# Patient Record
Sex: Female | Born: 1935 | Race: White | Hispanic: No | Smoking: Former smoker
Health system: Southern US, Community
[De-identification: ages and names within clinical notes are randomized; demographics above are authoritative.]

## PROBLEM LIST (undated history)

## (undated) DIAGNOSIS — Z87891 Personal history of nicotine dependence: Secondary | ICD-10-CM

## (undated) DIAGNOSIS — D490 Neoplasm of unspecified behavior of digestive system: Secondary | ICD-10-CM

## (undated) DIAGNOSIS — K559 Vascular disorder of intestine, unspecified: Secondary | ICD-10-CM

## (undated) DIAGNOSIS — J189 Pneumonia, unspecified organism: Secondary | ICD-10-CM

## (undated) DIAGNOSIS — I2510 Atherosclerotic heart disease of native coronary artery without angina pectoris: Secondary | ICD-10-CM

## (undated) DIAGNOSIS — F419 Anxiety disorder, unspecified: Secondary | ICD-10-CM

## (undated) DIAGNOSIS — I7300 Raynaud's syndrome without gangrene: Secondary | ICD-10-CM

## (undated) DIAGNOSIS — R0602 Shortness of breath: Secondary | ICD-10-CM

## (undated) DIAGNOSIS — I10 Essential (primary) hypertension: Secondary | ICD-10-CM

## (undated) DIAGNOSIS — J449 Chronic obstructive pulmonary disease, unspecified: Secondary | ICD-10-CM

## (undated) DIAGNOSIS — E785 Hyperlipidemia, unspecified: Secondary | ICD-10-CM

## (undated) DIAGNOSIS — I5020 Unspecified systolic (congestive) heart failure: Secondary | ICD-10-CM

## (undated) DIAGNOSIS — B0230 Zoster ocular disease, unspecified: Secondary | ICD-10-CM

## (undated) DIAGNOSIS — B0229 Other postherpetic nervous system involvement: Secondary | ICD-10-CM

## (undated) DIAGNOSIS — I213 ST elevation (STEMI) myocardial infarction of unspecified site: Secondary | ICD-10-CM

## (undated) DIAGNOSIS — I499 Cardiac arrhythmia, unspecified: Secondary | ICD-10-CM

## (undated) DIAGNOSIS — I209 Angina pectoris, unspecified: Secondary | ICD-10-CM

## (undated) DIAGNOSIS — I5033 Acute on chronic diastolic (congestive) heart failure: Secondary | ICD-10-CM

## (undated) DIAGNOSIS — I739 Peripheral vascular disease, unspecified: Secondary | ICD-10-CM

## (undated) HISTORY — PX: DILATION AND CURETTAGE OF UTERUS: SHX78

## (undated) HISTORY — DX: Neoplasm of unspecified behavior of digestive system: D49.0

## (undated) HISTORY — DX: Other postherpetic nervous system involvement: B02.29

## (undated) HISTORY — DX: Vascular disorder of intestine, unspecified: K55.9

## (undated) HISTORY — DX: Zoster ocular disease, unspecified: B02.30

## (undated) HISTORY — DX: Cardiac arrhythmia, unspecified: I49.9

## (undated) HISTORY — DX: Peripheral vascular disease, unspecified: I73.9

## (undated) HISTORY — DX: Atherosclerotic heart disease of native coronary artery without angina pectoris: I25.10

## (undated) HISTORY — DX: Shortness of breath: R06.02

## (undated) HISTORY — DX: Raynaud's syndrome without gangrene: I73.00

## (undated) HISTORY — DX: Hyperlipidemia, unspecified: E78.5

## (undated) HISTORY — DX: Chronic obstructive pulmonary disease, unspecified: J44.9

## (undated) HISTORY — DX: Personal history of nicotine dependence: Z87.891

## (undated) HISTORY — PX: CORONARY ANGIOPLASTY WITH STENT PLACEMENT: SHX49

## (undated) HISTORY — DX: Essential (primary) hypertension: I10

---
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
## 2007-05-03 ENCOUNTER — Inpatient Hospital Stay (HOSPITAL_COMMUNITY): Admission: EM | Admit: 2007-05-03 | Discharge: 2007-05-06 | Payer: Self-pay | Admitting: Emergency Medicine

## 2007-05-08 ENCOUNTER — Ambulatory Visit: Payer: Self-pay | Admitting: Internal Medicine

## 2007-06-24 ENCOUNTER — Ambulatory Visit: Payer: Self-pay | Admitting: Internal Medicine

## 2008-02-14 DIAGNOSIS — D509 Iron deficiency anemia, unspecified: Secondary | ICD-10-CM | POA: Insufficient documentation

## 2008-02-14 DIAGNOSIS — J4489 Other specified chronic obstructive pulmonary disease: Secondary | ICD-10-CM | POA: Insufficient documentation

## 2008-02-14 DIAGNOSIS — K559 Vascular disorder of intestine, unspecified: Secondary | ICD-10-CM | POA: Insufficient documentation

## 2008-02-14 DIAGNOSIS — B0239 Other herpes zoster eye disease: Secondary | ICD-10-CM | POA: Insufficient documentation

## 2008-02-14 DIAGNOSIS — J449 Chronic obstructive pulmonary disease, unspecified: Secondary | ICD-10-CM

## 2008-02-14 DIAGNOSIS — B0229 Other postherpetic nervous system involvement: Secondary | ICD-10-CM | POA: Insufficient documentation

## 2008-09-27 ENCOUNTER — Ambulatory Visit: Payer: Self-pay | Admitting: Internal Medicine

## 2008-09-27 ENCOUNTER — Inpatient Hospital Stay (HOSPITAL_COMMUNITY): Admission: EM | Admit: 2008-09-27 | Discharge: 2008-09-30 | Payer: Self-pay | Admitting: Emergency Medicine

## 2008-09-28 ENCOUNTER — Ambulatory Visit: Payer: Self-pay | Admitting: Surgery

## 2008-10-22 ENCOUNTER — Ambulatory Visit: Payer: Self-pay | Admitting: Cardiology

## 2008-11-23 ENCOUNTER — Ambulatory Visit: Payer: Self-pay | Admitting: Cardiology

## 2008-11-23 LAB — CONVERTED CEMR LAB
ALT: 22 units/L (ref 0–35)
AST: 20 units/L (ref 0–37)
Albumin: 3.8 g/dL (ref 3.5–5.2)
Cholesterol: 161 mg/dL (ref 0–200)
HDL: 82.3 mg/dL (ref >39.0)
Total Protein: 6.5 g/dL (ref 6.0–8.3)
Triglycerides: 57 mg/dL (ref 0–149)
VLDL: 11 mg/dL (ref 0–40)

## 2008-11-25 ENCOUNTER — Ambulatory Visit: Payer: Self-pay | Admitting: Internal Medicine

## 2008-11-25 ENCOUNTER — Inpatient Hospital Stay (HOSPITAL_COMMUNITY): Admission: EM | Admit: 2008-11-25 | Discharge: 2008-12-02 | Payer: Self-pay | Admitting: Emergency Medicine

## 2008-11-26 ENCOUNTER — Ambulatory Visit: Payer: Self-pay | Admitting: Surgery

## 2008-11-26 ENCOUNTER — Encounter: Payer: Self-pay | Admitting: Surgery

## 2008-11-27 HISTORY — PX: CORONARY ARTERY BYPASS GRAFT: SHX141

## 2008-11-30 ENCOUNTER — Encounter: Payer: Self-pay | Admitting: Surgery

## 2008-11-30 ENCOUNTER — Ambulatory Visit: Payer: Self-pay | Admitting: Surgery

## 2008-12-15 ENCOUNTER — Ambulatory Visit: Payer: Self-pay | Admitting: Cardiology

## 2008-12-15 LAB — CONVERTED CEMR LAB
BUN: 8 mg/dL (ref 6–23)
Basophils Absolute: 0 10*3/uL (ref 0.0–0.1)
Basophils Relative: 0.3 % (ref 0.0–3.0)
CO2: 28 meq/L (ref 19–32)
Chloride: 103 meq/L (ref 96–112)
Creatinine, Ser: 0.7 mg/dL (ref 0.4–1.2)
Eosinophils Relative: 0.4 % (ref 0.0–5.0)
GFR calc non Af Amer: 86 mL/min
Lymphocytes Relative: 8 % — ABNORMAL LOW (ref 12.0–46.0)
Neutrophils Relative %: 87 % — ABNORMAL HIGH (ref 43.0–77.0)
Platelets: 531 10*3/uL — ABNORMAL HIGH (ref 150–400)
Potassium: 3.6 meq/L (ref 3.5–5.1)
RBC: 4.38 M/uL (ref 3.87–5.11)
WBC: 8.3 10*3/uL (ref 4.5–10.5)

## 2008-12-17 ENCOUNTER — Ambulatory Visit: Payer: Self-pay | Admitting: Cardiology

## 2008-12-21 ENCOUNTER — Ambulatory Visit: Payer: Self-pay | Admitting: Surgery

## 2008-12-21 ENCOUNTER — Encounter: Admission: RE | Admit: 2008-12-21 | Discharge: 2008-12-21 | Payer: Self-pay | Admitting: Surgery

## 2009-01-05 ENCOUNTER — Ambulatory Visit: Payer: Self-pay | Admitting: Surgery

## 2009-01-05 ENCOUNTER — Encounter: Admission: RE | Admit: 2009-01-05 | Discharge: 2009-01-05 | Payer: Self-pay | Admitting: Surgery

## 2009-01-06 ENCOUNTER — Ambulatory Visit: Payer: Self-pay | Admitting: Cardiology

## 2009-01-06 LAB — CONVERTED CEMR LAB
ALT: 15 units/L (ref 0–35)
AST: 15 units/L (ref 0–37)
Alkaline Phosphatase: 79 units/L (ref 39–117)
Basophils Absolute: 0 10*3/uL (ref 0.0–0.1)
Basophils Relative: 0 % (ref 0.0–3.0)
Bilirubin, Direct: 0.1 mg/dL (ref 0.0–0.3)
CO2: 29 meq/L (ref 19–32)
Chloride: 106 meq/L (ref 96–112)
Cholesterol: 178 mg/dL (ref 0–200)
Eosinophils Absolute: 0.2 10*3/uL (ref 0.0–0.7)
GFR calc non Af Amer: 74 mL/min
HDL: 70.7 mg/dL (ref >39.0)
LDL Cholesterol: 93 mg/dL (ref 0–99)
Lymphocytes Relative: 21.1 % (ref 12.0–46.0)
MCHC: 34.6 g/dL (ref 30.0–36.0)
MCV: 89.6 fL (ref 78.0–100.0)
Neutrophils Relative %: 65.9 % (ref 43.0–77.0)
Platelets: 303 10*3/uL (ref 150–400)
Potassium: 5.2 meq/L — ABNORMAL HIGH (ref 3.5–5.1)
RBC: 4.55 M/uL (ref 3.87–5.11)
Sodium: 142 meq/L (ref 135–145)
Total Bilirubin: 0.7 mg/dL (ref 0.3–1.2)
VLDL: 14 mg/dL (ref 0–40)

## 2009-01-17 DIAGNOSIS — T887XXA Unspecified adverse effect of drug or medicament, initial encounter: Secondary | ICD-10-CM | POA: Insufficient documentation

## 2009-01-17 DIAGNOSIS — I10 Essential (primary) hypertension: Secondary | ICD-10-CM

## 2009-01-17 DIAGNOSIS — E785 Hyperlipidemia, unspecified: Secondary | ICD-10-CM

## 2009-01-17 DIAGNOSIS — I25810 Atherosclerosis of coronary artery bypass graft(s) without angina pectoris: Secondary | ICD-10-CM

## 2009-01-18 ENCOUNTER — Ambulatory Visit: Payer: Self-pay | Admitting: Cardiology

## 2009-01-18 ENCOUNTER — Encounter: Payer: Self-pay | Admitting: Cardiology

## 2009-01-18 DIAGNOSIS — E875 Hyperkalemia: Secondary | ICD-10-CM | POA: Insufficient documentation

## 2009-01-28 ENCOUNTER — Encounter (HOSPITAL_COMMUNITY): Admission: RE | Admit: 2009-01-28 | Discharge: 2009-04-28 | Payer: Self-pay | Admitting: Cardiology

## 2009-02-08 ENCOUNTER — Ambulatory Visit: Payer: Self-pay | Admitting: Cardiology

## 2009-02-08 ENCOUNTER — Inpatient Hospital Stay (HOSPITAL_COMMUNITY): Admission: EM | Admit: 2009-02-08 | Discharge: 2009-02-11 | Payer: Self-pay | Admitting: Emergency Medicine

## 2009-02-11 DIAGNOSIS — I499 Cardiac arrhythmia, unspecified: Secondary | ICD-10-CM

## 2009-02-11 HISTORY — DX: Cardiac arrhythmia, unspecified: I49.9

## 2009-02-16 ENCOUNTER — Ambulatory Visit: Payer: Self-pay | Admitting: Cardiology

## 2009-02-16 ENCOUNTER — Ambulatory Visit: Payer: Self-pay | Admitting: Cardiovascular Disease

## 2009-02-16 LAB — CONVERTED CEMR LAB
BUN: 22 mg/dL (ref 6–23)
CO2: 29 meq/L (ref 19–32)
Calcium: 10.4 mg/dL (ref 8.4–10.5)
Creatinine, Ser: 1 mg/dL (ref 0.4–1.2)
GFR calc non Af Amer: 56.85 mL/min (ref >60)
Glucose, Bld: 103 mg/dL — ABNORMAL HIGH (ref 70–99)
Sodium: 145 meq/L (ref 135–145)

## 2009-02-23 ENCOUNTER — Ambulatory Visit: Payer: Self-pay | Admitting: Cardiology

## 2009-02-23 DIAGNOSIS — I4891 Unspecified atrial fibrillation: Secondary | ICD-10-CM

## 2009-02-24 ENCOUNTER — Ambulatory Visit: Payer: Self-pay | Admitting: Cardiology

## 2009-02-24 ENCOUNTER — Encounter: Payer: Self-pay | Admitting: Cardiology

## 2009-02-24 DIAGNOSIS — I7389 Other specified peripheral vascular diseases: Secondary | ICD-10-CM | POA: Insufficient documentation

## 2009-03-02 ENCOUNTER — Ambulatory Visit: Payer: Self-pay | Admitting: Cardiology

## 2009-03-10 ENCOUNTER — Encounter: Payer: Self-pay | Admitting: Cardiology

## 2009-03-10 ENCOUNTER — Ambulatory Visit: Payer: Self-pay

## 2009-04-07 ENCOUNTER — Telehealth: Payer: Self-pay | Admitting: Cardiology

## 2009-04-08 ENCOUNTER — Encounter: Payer: Self-pay | Admitting: Cardiology

## 2009-04-13 ENCOUNTER — Encounter: Payer: Self-pay | Admitting: *Deleted

## 2009-04-20 ENCOUNTER — Ambulatory Visit: Payer: Self-pay | Admitting: Cardiovascular Disease

## 2009-04-20 DIAGNOSIS — I739 Peripheral vascular disease, unspecified: Secondary | ICD-10-CM | POA: Insufficient documentation

## 2009-04-22 ENCOUNTER — Ambulatory Visit: Payer: Self-pay | Admitting: Cardiology

## 2009-05-19 ENCOUNTER — Encounter: Payer: Self-pay | Admitting: *Deleted

## 2009-06-26 ENCOUNTER — Ambulatory Visit: Payer: Self-pay | Admitting: Internal Medicine

## 2009-06-27 ENCOUNTER — Inpatient Hospital Stay (HOSPITAL_COMMUNITY): Admission: EM | Admit: 2009-06-27 | Discharge: 2009-06-28 | Payer: Self-pay | Admitting: Emergency Medicine

## 2009-06-30 ENCOUNTER — Telehealth (INDEPENDENT_AMBULATORY_CARE_PROVIDER_SITE_OTHER): Payer: Self-pay | Admitting: Radiology

## 2009-07-28 ENCOUNTER — Encounter: Payer: Self-pay | Admitting: Cardiology

## 2009-07-30 ENCOUNTER — Ambulatory Visit: Payer: Self-pay | Admitting: Cardiology

## 2009-08-24 ENCOUNTER — Ambulatory Visit: Payer: Self-pay | Admitting: Cardiovascular Disease

## 2009-12-15 ENCOUNTER — Encounter (INDEPENDENT_AMBULATORY_CARE_PROVIDER_SITE_OTHER): Payer: Self-pay | Admitting: Emergency Medicine

## 2009-12-15 ENCOUNTER — Ambulatory Visit: Payer: Self-pay | Admitting: Vascular Surgery

## 2009-12-15 ENCOUNTER — Emergency Department (HOSPITAL_COMMUNITY): Admission: EM | Admit: 2009-12-15 | Discharge: 2009-12-15 | Payer: Self-pay | Admitting: Pediatrics

## 2009-12-20 ENCOUNTER — Encounter: Payer: Self-pay | Admitting: Cardiovascular Disease

## 2009-12-20 ENCOUNTER — Ambulatory Visit: Payer: Self-pay

## 2009-12-20 ENCOUNTER — Ambulatory Visit: Payer: Self-pay | Admitting: Cardiology

## 2009-12-20 DIAGNOSIS — I6529 Occlusion and stenosis of unspecified carotid artery: Secondary | ICD-10-CM

## 2010-02-15 ENCOUNTER — Encounter: Payer: Self-pay | Admitting: Cardiovascular Disease

## 2010-02-24 ENCOUNTER — Ambulatory Visit: Payer: Self-pay | Admitting: Cardiovascular Disease

## 2010-04-15 ENCOUNTER — Telehealth: Payer: Self-pay | Admitting: Cardiology

## 2010-06-14 ENCOUNTER — Ambulatory Visit: Payer: Self-pay | Admitting: Cardiology

## 2010-08-22 ENCOUNTER — Observation Stay (HOSPITAL_COMMUNITY): Admission: EM | Admit: 2010-08-22 | Discharge: 2010-08-23 | Payer: Self-pay | Admitting: Emergency Medicine

## 2010-08-22 ENCOUNTER — Ambulatory Visit: Payer: Self-pay | Admitting: Cardiology

## 2010-09-09 ENCOUNTER — Encounter: Payer: Self-pay | Admitting: Cardiovascular Disease

## 2010-09-12 ENCOUNTER — Ambulatory Visit: Payer: Self-pay | Admitting: Cardiovascular Disease

## 2010-09-12 ENCOUNTER — Ambulatory Visit: Payer: Self-pay

## 2010-12-14 NOTE — Assessment & Plan Note (Signed)
Summary: 6 month follow up/443.9/pla   Visit Type:  6 mo f/u Referring Provider:  Charlies Constable Primary Provider:  Eric Form  CC:  pt states she feels very good today....Marland Kitchenno complaints today.  History of Present Illness: 77 yo WF with history of PAD,  CAD s/p CABG 1/10, HTN, hyperlipidemia, former tobacco abuse and COPD who is here today for PV follow up. I initially saw her in June of 2010 for PV workup. she is followed by Dr. Juanda Chance for her cardiac issues. At the first visit, she was complaining of right leg cramping with long periods of walking but had no claudication with walking short distances. We elected to manage her conservatively with a walking program. She is here today for follow up. She has been doing well. She has been walking with very little pain in her right leg. She did have an episode in January of severe left leg pain that started at night. She was seen in the ED and had negative venous dopplers. Dr. Clelia Croft evaluated her and per her report, felt that the pain was neuromuscular. The pain resolved that night. No recurrent left leg pain.  She has no rest pain, numbness or ulcerations in the right leg or foot. Her bilateral lower ext varicosities are unchanged. She has no other complaints today. She once again tells me that she prefers to manage her PV disease conservatively at this time.    She was admitted to [facility] overnight on 08/23/10 for chest pain and ruled out for an MI with cardiac enzymes. There were discussions about a stress test but she refused.   ABI today 0.59 on right and 0.77 on the left.   8 Current Medications (verified): 1)  Amlodipine Besylate 5 Mg Tabs (Amlodipine Besylate) .Marland Kitchen.. 1 1/2 Tab Once Daily 2)  Bayer Low Strength 81 Mg Tbec (Aspirin) .... Take One Tab Once Daily 3)  Colace 100 Mg Caps (Docusate Sodium) .... Prn 4)  Nitrostat 0.4 Mg Subl (Nitroglycerin) .... Prn 5)  Tramadol Hcl 50 Mg Tabs (Tramadol Hcl) .... Prn 6)  Furosemide 20 Mg Tabs  (Furosemide) .... As Needed 7)  Warfarin Sodium 2.5 Mg Tabs (Warfarin Sodium) .... Use As Directed By Anticoagualtion Clinic 8)  Fish Oil 1200 Mg Caps (Omega-3 Fatty Acids) .... Take 1 Capsule By Mouth Once A Day 9)  Coq10 100 Mg Caps (Coenzyme Q10) .... Once A Day 10)  Vitamin D 1000 Unit Tabs (Cholecalciferol) .... Take 1 Tablet By Mouth Once A Day  Allergies: 1)  ! * Codiene 2)  ! Acetamin (Acetaminophen) 3)  ! Plavix (Clopidogrel Bisulfate) 4)  ! * Statins  Past History:  Past Medical History: Reviewed history from 07/29/2009 and no changes required. 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 Coronary artery disease status post previous anterior wall     myocardial infarction, treated with PTCA dx and subsequent non-ST-     elevation myocardial infarction with recent bypass surgery, November 29, 2008. 2. Hypertension. 3. Hyperlipidemia 4. Shortness of breath, uncertain etiology. 5. Previous cigarette smoking. 6. Chronic obstructive pulmonary disease. 7. Raynaud phenomenon. 8. History of ischemic colitis.  9. Drug allergy Plavix Nausea 10. Paroxysmal Fibrillation 02/2009 11. Intolerance to multiple medications including Cardizem, beta blockers, Lipitor, Plavix 12. PVD with claudication  Social History: Reviewed history from 04/20/2009 and no changes required. She lives alone. Homemaker She moved  here from [place] to live near her daughter and son-in-law. She is a former smoker. Tobacco abuse for 65 years, one pack per day, stopped in January 2010. 2 alcoholic beverages per day No ilicit drugs  Review of Systems  The patient denies fatigue, malaise, fever, weight gain/loss, vision loss, decreased hearing, hoarseness, chest pain, palpitations, shortness of breath, prolonged cough, wheezing,  sleep apnea, coughing up blood, abdominal pain, blood in stool, nausea, vomiting, diarrhea, heartburn, incontinence, blood in urine, muscle weakness, joint pain, leg swelling, rash, skin lesions, headache, fainting, dizziness, depression, anxiety, enlarged lymph nodes, easy bruising or bleeding, and environmental allergies.    Vital Signs:  Patient profile:   77 year old female Height:      63 inches Weight:      124.8 pounds Pulse rate:   78 / minute Pulse rhythm:   irregular BP sitting:   166 / 80  (left arm) Cuff size:   regular  Vitals Entered By: Danielle Rankin, CMA (September 12, 2010 11:50 AM)  Physical Exam  General:  General: Well developed, well nourished, NAD HEENT: OP clear, mucus membranes moist Musculoskeletal: Muscle strength 5/5 all ext Psychiatric: Mood and affect normal Neck: No JVD, no carotid bruits, no thyromegaly, no lymphadenopathy. Lungs:Clear bilaterally, no wheezes, rhonci, crackles CV: RRR no murmurs, gallops rubs Abdomen: soft, NT, ND, BS present Extremities: No edema, pulses 1+ left DP/PT. trace right DP/PT. Varicosities over both feet.     ABI's  Procedure date:  09/12/2010  Findings:      Right ABI 0.59 (previously 0.50) Left ABI 0.77 (previously 0.94)  EKG  Procedure date:  09/12/2010  Findings:      NSR, rate 78 bpm. Non-specific T wave abnormalities.   Impression & Recommendations:  Problem # 1:  PVD WITH CLAUDICATION (ICD-443.89) Stable ABI. No leg pain with walking or at rest. Continue conservative therapy. Repeat ABI in 6 months. She will call us if there is any change in her clinical status.   Problem # 2:  CAD, AUTOLOGOUS BYPASS GRAFT (ICD-414.02) Stable. Recent episode of atypical pain. She does not wish to have a cardiac stress test. Her BP is slightly up today but she does not wish to change her meds. Her Norvasc was recently started.   Her updated medication list for this problem includes:    Amlodipine Besylate 5 Mg Tabs  (Amlodipine besylate) .Marland Kitchen... 1 1/2 tab once daily    Bayer Low Strength 81 Mg Tbec (Aspirin) .Marland Kitchen... Take one tab once daily    Nitrostat 0.4 Mg Subl (Nitroglycerin) .Marland Kitchen... Prn    Warfarin Sodium 2.5 Mg Tabs (Warfarin sodium) ..... Use as directed by anticoagualtion clinic  Other Orders: EKG w/ Interpretation (93000)  Patient Instructions: 1)  Your physician recommends that you schedule a follow-up appointment in: 6 months 2)  Your physician recommends that you continue on your current medications as directed. Please refer to the Current Medication list given to you today. 3)  Your physician has requested that you have an ankle brachial index (ABI) in 6 months. During this test an ultrasound and blood pressure cuff are used to evaluate the arteries that supply the arms and legs with blood. Allow thirty minutes for this exam. There are no restrictions or special instructions.

## 2010-12-14 NOTE — Progress Notes (Signed)
Summary: personal call, no info was given  Phone Note Call from Patient Call back at Home Phone [phone]   Caller: Patient Reason for Call: Talk to Nurse Summary of Call: personal call, no info was given.  Initial call taken by: Lorne Skeens,  April 15, 2010 9:47 AM  Follow-up for Phone Call        talked with pt by telephone--she is requesting an appt with Dr Warnell Bureau made with Dr Juanda Chance 06-14-10

## 2010-12-14 NOTE — Assessment & Plan Note (Signed)
Summary: per check out/sf   Visit Type:  Follow-up Referring Provider:  Charlies Constable Primary Provider:  Eric Form  CC:  Right leg pain when walking long distances.  History of Present Illness: 77 yo WF with history of PAD,  CAD s/p CABG 1/10, HTN, hyperlipidemia, former tobacco abuse and COPD who is here today for PV follow up. I initially saw her in June of 2010 for PV workup. she is followed by Dr. Juanda Chance for her cardiac issues. At the first visit, she was complaining of right leg cramping with long periods of walking but had no claudication with walking short distances. We elected to manage her conservatively with a walking program. She is here today for follow up. She has been doing well. She has been walking with very little pain in her right leg. She did have an episode in January of severe left leg pain that started at night. She was seen in the ED and had negative venous dopplers. Dr. Clelia Croft evaluated her and per her report, felt that the pain was neuromuscular. The pain resolved that night. No recurrent left leg pain.  She has no rest pain, numbness or ulcerations in the right leg or foot. Her bilateral lower ext varicosities are unchanged. She has no other complaints today. She once again tells me that she prefers to manage her PV disease conservatively at this time.  Her BP is elevated today but her best friend died yesterday and she has had her grandkids. She does not wish to change her medications. She was seen by Dr. Clelia Croft last week and her BP was ok per her report.    Current Medications (verified): 1)  Amlodipine Besylate 5 Mg Tabs (Amlodipine Besylate) .... Take One Tab Once Daily 2)  Bayer Low Strength 81 Mg Tbec (Aspirin) .... Take One Tab Once Daily 3)  Colace 100 Mg Caps (Docusate Sodium) .... Prn 4)  Nitrostat 0.4 Mg Subl (Nitroglycerin) .... Prn 5)  Tramadol Hcl 50 Mg Tabs (Tramadol Hcl) .... Prn 6)  Furosemide 20 Mg Tabs (Furosemide) .... As Needed 7)  Warfarin Sodium 2.5  Mg Tabs (Warfarin Sodium) .... Use As Directed By Anticoagualtion Clinic 8)  Fish Oil 1200 Mg Caps (Omega-3 Fatty Acids) .... Take 1 Capsule By Mouth Once A Day 9)  Coq10 100 Mg Caps (Coenzyme Q10) .... Once A Day 10)  Vitamin D 1000 Unit Tabs (Cholecalciferol) .... Take 1 Tablet By Mouth Once A Day  Allergies: 1)  ! * Codiene 2)  ! Acetamin (Acetaminophen) 3)  ! Plavix (Clopidogrel Bisulfate) 4)  ! * Statins  Past History:  Past Medical History: Reviewed history from 07/29/2009 and no changes required. 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 Coronary artery disease status post previous anterior wall     myocardial infarction, treated with PTCA dx and subsequent non-ST-     elevation myocardial infarction with recent bypass surgery, November 29, 2008. 2. Hypertension. 3. Hyperlipidemia 4. Shortness of breath, uncertain etiology. 5. Previous cigarette smoking. 6. Chronic obstructive pulmonary disease. 7. Raynaud phenomenon. 8. History of ischemic colitis.  9. Drug allergy Plavix Nausea 10. Paroxysmal Fibrillation 02/2009 11. Intolerance to multiple medications including Cardizem, beta blockers, Lipitor, Plavix 12. PVD with claudication  Social History: Reviewed history from 04/20/2009 and no changes required. She lives alone. Homemaker She moved here from [place] to live near her daughter  and son-in-law. She is a former smoker. Tobacco abuse for 65 years, one pack per day, stopped in January 2010. 2 alcoholic beverages per day No ilicit drugs  Review of Systems  The patient denies fatigue, malaise, fever, weight gain/loss, vision loss, decreased hearing, hoarseness, chest pain, palpitations, shortness of breath, prolonged cough, wheezing, sleep apnea, coughing up blood, abdominal pain, blood in  stool, nausea, vomiting, diarrhea, heartburn, incontinence, blood in urine, muscle weakness, joint pain, leg swelling, rash, skin lesions, headache, fainting, dizziness, depression, anxiety, enlarged lymph nodes, easy bruising or bleeding, and environmental allergies.         See HPI  Vital Signs:  Patient profile:   77 year old female Height:      63 inches Weight:      127.75 pounds BMI:     22.71 Pulse rate:   76 / minute Pulse rhythm:   irregular Resp:     18 per minute BP sitting:   180 / 100  (left arm) Cuff size:   regular  Vitals Entered By: Vikki Ports (February 24, 2010 9:00 AM)  Serial Vital Signs/Assessments:  Time      Position  BP       Pulse  Resp  Temp     By           R Arm     180/100                        Vikki Ports   Physical Exam  General:  General: Well developed, well nourished, NAD HEENT: OP clear, mucus membranes moist Musculoskeletal: Muscle strength 5/5 all ext Psychiatric: Mood and affect normal Neck: No JVD, no carotid bruits, no thyromegaly, no lymphadenopathy. Lungs:Clear bilaterally, no wheezes, rhonci, crackles CV: RRR no murmurs, gallops rubs Abdomen: soft, NT, ND, BS present Extremities: No edema, pulses 1+ left DP/PT. trace right DP/PT. Varicosities over both feet.      Carotid Doppler  Procedure date:  12/20/2009  Findings:      RICA 0-39% LICA 40-59%  Arterial Doppler  Procedure date:  12/20/2009  Findings:      Right ABI 0.50 Left ABI 0.94. Stable  Impression & Recommendations:  Problem # 1:  PVD WITH CLAUDICATION (ICD-443.89) Her symptoms are unchanged. She continues to have mild pain in the right leg when walking long distances but this has not been bothersome. She is known to have an occlusion of the right SFA with reconstitution by collaterals in the distal SFA. She wishes to continue conservative therapy and monitoring. Continue ASA and walking program for now. Repeat ABI 6 months. I will not start Pletal or  Plavix since she is on coumadin.   Problem # 2:  CAROTID ARTERY DISEASE (ICD-433.10) Repeat dopplers in February 2012. Stable disease as above.  Her updated medication list for this problem includes:    Bayer Low Strength 81 Mg Tbec (Aspirin) .Marland Kitchen... Take one tab once daily    Warfarin Sodium 2.5 Mg Tabs (Warfarin sodium) ..... Use as directed by anticoagualtion clinic  Problem # 3:  HYPERTENSION, BENIGN (ICD-401.1) Elevated today. She does not wish to change her medications. Her BP has been controlled on all other recent checks.   Her updated medication list for this problem includes:    Amlodipine Besylate 5 Mg Tabs (Amlodipine besylate) .Marland Kitchen... Take one tab once daily    Bayer Low Strength 81 Mg Tbec (Aspirin) .Marland Kitchen... Take one tab once daily    Furosemide  20 Mg Tabs (Furosemide) .Marland Kitchen... As needed  Patient Instructions: 1)  Your physician recommends that you schedule a follow-up appointment in: 6 months 2)  Your physician recommends that you continue on your current medications as directed. Please refer to the Current Medication list given to you today. 3)  Your physician has requested that you have an ankle brachial index (ABI). During this test an ultrasound and blood pressure cuff are used to evaluate the arteries that supply the arms and legs with blood. Allow thirty minutes for this exam. There are no restrictions or special instructions.  To be done in 6 months

## 2010-12-14 NOTE — Assessment & Plan Note (Signed)
Summary: f66m   Visit Type:  Follow-up Referring Provider:  Charlies Constable Primary Provider:  Eric Form  CC:  pt concerned about her legs and her veins..  History of Present Illness: The patient is 77 years old and return for followup management of CAD and paroxysmal atrial fibrillation. She previously had a standing to do a diagnostic with an in and had recurrence and then underwent bypass surgery for multivessel disease in January 2010. She has done well from the standpoint of her heart since that time with no recent chest pain.  She also has had paroxysmal atrial fibrillation which initially was managed with rate control and Coumadin. She was intolerant to rate control medications and is just being treated with Coumadin but has had no recurrences. She is at high risk for stroke and has a [place] Vasc score of 5.  Her other problem is peripheral vascular disease. She has an index of 0.5 on the right and 0.9 in the left and has symptoms of right calf claudication. These are not very limiting. She has seen Dr. Clifton James and consider therapy has been planned.  She also has carotid disease was 0-39% on the right and 40-59% on the left.    Current Medications (verified): 1)  Amlodipine Besylate 5 Mg Tabs (Amlodipine Besylate) .... Take One Tab Once Daily 2)  Bayer Low Strength 81 Mg Tbec (Aspirin) .... Take One Tab Once Daily 3)  Colace 100 Mg Caps (Docusate Sodium) .... Prn 4)  Nitrostat 0.4 Mg Subl (Nitroglycerin) .... Prn 5)  Tramadol Hcl 50 Mg Tabs (Tramadol Hcl) .... Prn 6)  Furosemide 20 Mg Tabs (Furosemide) .... As Needed 7)  Warfarin Sodium 2.5 Mg Tabs (Warfarin Sodium) .... Use As Directed By Anticoagualtion Clinic 8)  Fish Oil 1200 Mg Caps (Omega-3 Fatty Acids) .... Take 1 Capsule By Mouth Once A Day 9)  Coq10 100 Mg Caps (Coenzyme Q10) .... Once A Day 10)  Vitamin D 1000 Unit Tabs (Cholecalciferol) .... Take 1 Tablet By Mouth Once A Day  Allergies (verified): 1)  ! * Codiene 2)  !  Acetamin (Acetaminophen) 3)  ! Plavix (Clopidogrel Bisulfate) 4)  ! * Statins  Past History:  Past Medical History: Reviewed history from 07/29/2009 and no changes required. 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 Coronary artery disease status post previous anterior wall     myocardial infarction, treated with PTCA dx and subsequent non-ST-     elevation myocardial infarction with recent bypass surgery, November 29, 2008. 2. Hypertension. 3. Hyperlipidemia 4. Shortness of breath, uncertain etiology. 5. Previous cigarette smoking. 6. Chronic obstructive pulmonary disease. 7. Raynaud phenomenon. 8. History of ischemic colitis.  9. Drug allergy Plavix Nausea 10. Paroxysmal Fibrillation 02/2009 11. Intolerance to multiple medications including Cardizem, beta blockers, Lipitor, Plavix 12. PVD with claudication  Review of Systems       ROS is negative except as outlined in HPI.   Vital Signs:  Patient profile:   77 year old female Height:      63 inches Weight:      131 pounds Pulse rate:   59 / minute BP sitting:   166 / 77  (left arm)  Vitals Entered By: Burnett Kanaris, CNA (June 14, 2010 3:19 PM)  Physical Exam  Additional Exam:  Gen. Well-nourished, in no distress   Neck: No JVD, thyroid not enlarged,  no carotid bruits Lungs: No tachypnea, clear without rales, rhonchi or wheezes Cardiovascular: Rhythm regular, PMI not displaced,  heart sounds  normal, no murmurs or gallops, no peripheral edema, pulses normal in all 4 extremities. Abdomen: BS normal, abdomen soft and non-tender without masses or organomegaly, no hepatosplenomegaly. MS: No deformities, no cyanosis or clubbing   Neuro:  No focal sns   Skin:  no lesions , multiple varicosities on the lower extremities   Impression &  Recommendations:  Problem # 1:  CAD, AUTOLOGOUS BYPASS GRAFT (ICD-414.02)  She had prior CABG.   She's had no recent chest pain this problem appears stable. Her updated medication list for this problem includes:    Amlodipine Besylate 5 Mg Tabs (Amlodipine besylate) .Marland Kitchen... Take one tab once daily    Bayer Low Strength 81 Mg Tbec (Aspirin) .Marland Kitchen... Take one tab once daily    Nitrostat 0.4 Mg Subl (Nitroglycerin) .Marland Kitchen... Prn    Warfarin Sodium 2.5 Mg Tabs (Warfarin sodium) ..... Use as directed by anticoagualtion clinic  Her updated medication list for this problem includes:    Amlodipine Besylate 5 Mg Tabs (Amlodipine besylate) .Marland Kitchen... Take one tab once daily    Bayer Low Strength 81 Mg Tbec (Aspirin) .Marland Kitchen... Take one tab once daily    Nitrostat 0.4 Mg Subl (Nitroglycerin) .Marland Kitchen... Prn    Warfarin Sodium 2.5 Mg Tabs (Warfarin sodium) ..... Use as directed by anticoagualtion clinic  Problem # 2:  ATRIAL FIBRILLATION (ICD-427.31)  She has a history of paroxysmal atrial fibrillation. She's had no recent recurrences. She asked about coming off of Coumadin. She has a very high chance course so her risk of stroke is fairly high off Coumadin. She understands this and is agreeable to staying on Coumadin. Her updated medication list for this problem includes:    Bayer Low Strength 81 Mg Tbec (Aspirin) .Marland Kitchen... Take one tab once daily    Warfarin Sodium 2.5 Mg Tabs (Warfarin sodium) ..... Use as directed by anticoagualtion clinic  Her updated medication list for this problem includes:    Bayer Low Strength 81 Mg Tbec (Aspirin) .Marland Kitchen... Take one tab once daily    Warfarin Sodium 2.5 Mg Tabs (Warfarin sodium) ..... Use as directed by anticoagualtion clinic  Problem # 3:  PVD WITH CLAUDICATION (ICD-443.89) She has peripheral vascular disease PRIMARILY INVOLVING THE RIGHT LOWER EXTREMITY WITH SOME CALF CLAUDICATION> This is not limiting and we plan conservative therapy.  Patient Instructions: 1)  Your physician  recommends that you continue on your current medications as directed. Please refer to the Current Medication list given to you today. 2)  Your physician wants you to follow-up in:  6 months with Dr. Clifton James. You will receive a reminder letter in the mail two months in advance. If you don't receive a letter, please call our office to schedule the follow-up appointment.

## 2010-12-14 NOTE — Assessment & Plan Note (Signed)
Summary: f30m   Referring Provider:  Charlies Constable Primary Provider:  Eric Form   History of Present Illness: The patient is 77 years old and return for management of CAD and atrial fibrillation. She had bypass surgery in January 2010 after having a second heart attack. She later developed paroxysmal atrial fibrillation and was initially put on weight control medication and Coumadin. She could not tolerate any of the rate control medication and this was later discontinued. Portals she's had no recurrences of her fibrillation. She's had no recent chest pain although she was hospitalized with chest pain last summer one time and ruled out and then decided not to have a Myoview scan.  She was recently in the emergency department with some left leg pain and was ruled out for deep vein thrombophlebitis. Her symptoms were resolved and probably were related to a pinched nerve.  Other problems include peripheral vascular disease with claudication and decreased indices in the right lower extremity and COPD. She had carotid Doppler studies today which showed 0-39% stenosis on the right and 40-59% stenosis of the left. She had peripheral Doppler studies which showed an index of 0.50 on the right and 0.94 on the left. This had not changed significantly from the previous study.  Current Medications (verified): 1)  Amlodipine Besylate 5 Mg Tabs (Amlodipine Besylate) .... Take One Tab Once Daily 2)  Bayer Low Strength 81 Mg Tbec (Aspirin) .... Take One Tab Once Daily 3)  Colace 100 Mg Caps (Docusate Sodium) .... Prn 4)  Nitrostat 0.4 Mg Subl (Nitroglycerin) .... Prn 5)  Tramadol Hcl 50 Mg Tabs (Tramadol Hcl) .... Prn 6)  Furosemide 20 Mg Tabs (Furosemide) .... As Needed 7)  Warfarin Sodium 2.5 Mg Tabs (Warfarin Sodium) .... Use As Directed By Anticoagualtion Clinic 8)  Fish Oil 1000 Mg Caps (Omega-3 Fatty Acids) .Marland Kitchen.. 1 Cap Once Daily 9)  Co Q-10 30 Mg  Caps (Coenzyme Q10) .... Take One Tablet By Mouth Once  Daily.  Allergies (verified): 1)  ! * Codiene 2)  ! Acetamin (Acetaminophen) 3)  ! Plavix (Clopidogrel Bisulfate)  Past History:  Past Medical History: Reviewed history from 07/29/2009 and no changes required. 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 Coronary artery disease status post previous anterior wall     myocardial infarction, treated with PTCA dx and subsequent non-ST-     elevation myocardial infarction with recent bypass surgery, November 29, 2008. 2. Hypertension. 3. Hyperlipidemia 4. Shortness of breath, uncertain etiology. 5. Previous cigarette smoking. 6. Chronic obstructive pulmonary disease. 7. Raynaud phenomenon. 8. History of ischemic colitis.  9. Drug allergy Plavix Nausea 10. Paroxysmal Fibrillation 02/2009 11. Intolerance to multiple medications including Cardizem, beta blockers, Lipitor, Plavix 12. PVD with claudication  Review of Systems       ROS is negative except as outlined in HPI.   Vital Signs:  Patient profile:   77 year old female Height:      63 inches Weight:      129 pounds BMI:     22.93 Pulse rate:   67 / minute Resp:     16 per minute BP sitting:   130 / 72  (right arm)  Vitals Entered By: Marrion Coy, CNA (December 20, 2009 2:21 PM)  Physical Exam  Additional Exam:  Gen. Well-nourished, in no distress   Neck: No JVD, thyroid not enlarged,  bilateral carotid bruits Lungs: No tachypnea, clear without rales, rhonchi or wheezes Cardiovascular: Rhythm regular, PMI not displaced,  heart sounds  normal, no murmurs or gallops, no peripheral edema, decreased pulses in the right lower extremity Abdomen: BS normal, abdomen soft and non-tender without masses or organomegaly, no hepatosplenomegaly. MS: No deformities, no cyanosis or clubbing   Neuro:  No focal sns    Skin:  no lesions    Impression & Recommendations:  Problem # 1:  CAD, AUTOLOGOUS BYPASS GRAFT (ICD-414.02) She had bypass surgery in January of 2010. She has had no recent chest pain. This problem appears stable. Her updated medication list for this problem includes:    Amlodipine Besylate 5 Mg Tabs (Amlodipine besylate) .Marland Kitchen... Take one tab once daily    Bayer Low Strength 81 Mg Tbec (Aspirin) .Marland Kitchen... Take one tab once daily    Nitrostat 0.4 Mg Subl (Nitroglycerin) .Marland Kitchen... Prn    Warfarin Sodium 2.5 Mg Tabs (Warfarin sodium) ..... Use as directed by anticoagualtion clinic  Orders: EKG w/ Interpretation (93000)  Problem # 2:  PVD WITH CLAUDICATION (ICD-443.89) She has claudication and decreased indices in the right lower extremity. Her indices today had not changed significantly and her symptoms remained stable.  Problem # 3:  CAROTID ARTERY DISEASE (ICD-433.10) She has bilateral carotid disease but her carotid Dopplers have been stable. She's had no cerebral symptoms. Her updated medication list for this problem includes:    Bayer Low Strength 81 Mg Tbec (Aspirin) .Marland Kitchen... Take one tab once daily    Warfarin Sodium 2.5 Mg Tabs (Warfarin sodium) ..... Use as directed by anticoagualtion clinic  Problem # 4:  HYPERTENSION, BENIGN (ICD-401.1) Or blood pressure has been better controlled recently and was good today. Her updated medication list for this problem includes:    Amlodipine Besylate 5 Mg Tabs (Amlodipine besylate) .Marland Kitchen... Take one tab once daily    Bayer Low Strength 81 Mg Tbec (Aspirin) .Marland Kitchen... Take one tab once daily    Furosemide 20 Mg Tabs (Furosemide) .Marland Kitchen... As needed  Patient Instructions: 1)  Your physician wants you to follow-up in: 6 months. You will receive a reminder letter in the mail two months in advance. If you don't receive a letter, please call our office to schedule the follow-up appointment.

## 2010-12-14 NOTE — Miscellaneous (Signed)
Summary: Orders Update  Clinical Lists Changes  Orders: Added new Test order of Arterial Duplex Lower Extremity (Arterial Duplex Low) - Signed 

## 2010-12-14 NOTE — Miscellaneous (Signed)
Summary: Orders Update  Clinical Lists Changes  Problems: Added new problem of CAROTID ARTERY DISEASE (ICD-433.10) Orders: Added new Test order of Carotid Duplex (Carotid Duplex) - Signed 

## 2011-01-26 LAB — CARDIAC PANEL(CRET KIN+CKTOT+MB+TROPI)
CK, MB: 2 ng/mL (ref 0.3–4.0)
CK, MB: 2.1 ng/mL (ref 0.3–4.0)
Relative Index: INVALID (ref 0.0–2.5)
Total CK: 56 U/L (ref 7–177)
Total CK: 64 U/L (ref 7–177)

## 2011-01-26 LAB — DIFFERENTIAL
Basophils Absolute: 0 10*3/uL (ref 0.0–0.1)
Basophils Relative: 1 % (ref 0–1)
Eosinophils Absolute: 0 10*3/uL (ref 0.0–0.7)
Eosinophils Relative: 2 % (ref 0–5)
Lymphocytes Relative: 31 % (ref 12–46)
Lymphs Abs: 1.8 10*3/uL (ref 0.7–4.0)
Monocytes Absolute: 0.6 10*3/uL (ref 0.1–1.0)
Monocytes Relative: 10 % (ref 3–12)
Neutro Abs: 3.4 10*3/uL (ref 1.7–7.7)
Neutrophils Relative %: 61 % (ref 43–77)

## 2011-01-26 LAB — POCT CARDIAC MARKERS
CKMB, poc: 1.1 ng/mL (ref 1.0–8.0)
Troponin i, poc: <0.05 ng/mL (ref 0.00–0.09)
Troponin i, poc: <0.05 ng/mL (ref 0.00–0.09)

## 2011-01-26 LAB — LIPID PANEL
Cholesterol: 230 mg/dL — ABNORMAL HIGH (ref 0–200)
HDL: 90 mg/dL (ref >39)
LDL Cholesterol: 126 mg/dL — ABNORMAL HIGH (ref 0–99)
Total CHOL/HDL Ratio: 2.6 RATIO
Triglycerides: 71 mg/dL (ref <150)

## 2011-01-26 LAB — COMPREHENSIVE METABOLIC PANEL
ALT: 15 U/L (ref 0–35)
AST: 16 U/L (ref 0–37)
Albumin: 3.1 g/dL — ABNORMAL LOW (ref 3.5–5.2)
Calcium: 9.1 mg/dL (ref 8.4–10.5)
Creatinine, Ser: 0.78 mg/dL (ref 0.4–1.2)
GFR calc Af Amer: >60 mL/min (ref >60)
Sodium: 142 mEq/L (ref 135–145)

## 2011-01-26 LAB — CBC
Hemoglobin: 16.9 g/dL — ABNORMAL HIGH (ref 12.0–15.0)
MCH: 31.4 pg (ref 26.0–34.0)
MCH: 32.2 pg (ref 26.0–34.0)
MCHC: 33.6 g/dL (ref 30.0–36.0)
MCHC: 34.6 g/dL (ref 30.0–36.0)
Platelets: 265 10*3/uL (ref 150–400)
Platelets: 295 10*3/uL (ref 150–400)
RBC: 4.39 MIL/uL (ref 3.87–5.11)
RDW: 14.1 % (ref 11.5–15.5)

## 2011-01-26 LAB — POCT I-STAT, CHEM 8
Calcium, Ion: 1.1 mmol/L — ABNORMAL LOW (ref 1.12–1.32)
Chloride: 108 mEq/L (ref 96–112)
Glucose, Bld: 124 mg/dL — ABNORMAL HIGH (ref 70–99)
HCT: 52 % — ABNORMAL HIGH (ref 36.0–46.0)
Hemoglobin: 17.7 g/dL — ABNORMAL HIGH (ref 12.0–15.0)
TCO2: 25 mmol/L (ref 0–100)

## 2011-01-26 LAB — BRAIN NATRIURETIC PEPTIDE: Pro B Natriuretic peptide (BNP): 194 pg/mL — ABNORMAL HIGH (ref 0.0–100.0)

## 2011-01-26 LAB — PROTIME-INR
INR: 1.68 — ABNORMAL HIGH (ref 0.00–1.49)
Prothrombin Time: 20 seconds — ABNORMAL HIGH (ref 11.6–15.2)

## 2011-01-26 LAB — APTT: aPTT: 32 seconds (ref 24–37)

## 2011-01-26 LAB — CK TOTAL AND CKMB (NOT AT ARMC)
CK, MB: 2.3 ng/mL (ref 0.3–4.0)
Relative Index: INVALID (ref 0.0–2.5)

## 2011-02-18 LAB — CARDIAC PANEL(CRET KIN+CKTOT+MB+TROPI)
CK, MB: 1.9 ng/mL (ref 0.3–4.0)
CK, MB: 2.1 ng/mL (ref 0.3–4.0)
CK, MB: 2.2 ng/mL (ref 0.3–4.0)
Relative Index: INVALID (ref 0.0–2.5)
Relative Index: INVALID (ref 0.0–2.5)
Total CK: 59 U/L (ref 7–177)
Troponin I: 0.02 ng/mL (ref 0.00–0.06)
Troponin I: 0.02 ng/mL (ref 0.00–0.06)
Troponin I: 0.02 ng/mL (ref 0.00–0.06)

## 2011-02-18 LAB — CBC
MCHC: 34.6 g/dL (ref 30.0–36.0)
MCV: 91.9 fL (ref 78.0–100.0)
MCV: 92 fL (ref 78.0–100.0)
MCV: 92.1 fL (ref 78.0–100.0)
Platelets: 235 10*3/uL (ref 150–400)
Platelets: 238 10*3/uL (ref 150–400)
Platelets: 246 10*3/uL (ref 150–400)
RDW: 14.9 % (ref 11.5–15.5)
WBC: 5.9 10*3/uL (ref 4.0–10.5)
WBC: 5.7 10*3/uL (ref 4.0–10.5)
WBC: 6.3 10*3/uL (ref 4.0–10.5)

## 2011-02-18 LAB — COMPREHENSIVE METABOLIC PANEL
ALT: 21 U/L (ref 0–35)
AST: 25 U/L (ref 0–37)
Albumin: 4.1 g/dL (ref 3.5–5.2)
Calcium: 9.6 mg/dL (ref 8.4–10.5)
GFR calc Af Amer: >60 mL/min (ref >60)
Sodium: 140 mEq/L (ref 135–145)
Total Protein: 6.9 g/dL (ref 6.0–8.3)

## 2011-02-18 LAB — BASIC METABOLIC PANEL
Chloride: 107 mEq/L (ref 96–112)
Creatinine, Ser: 0.81 mg/dL (ref 0.4–1.2)
GFR calc Af Amer: >60 mL/min (ref >60)
Potassium: 3.6 mEq/L (ref 3.5–5.1)
Sodium: 143 mEq/L (ref 135–145)

## 2011-02-18 LAB — DIFFERENTIAL
Eosinophils Absolute: 0.1 10*3/uL (ref 0.0–0.7)
Eosinophils Relative: 1 % (ref 0–5)
Lymphs Abs: 1.4 10*3/uL (ref 0.7–4.0)
Monocytes Relative: 9 % (ref 3–12)

## 2011-02-18 LAB — CK TOTAL AND CKMB (NOT AT ARMC)
CK, MB: 2.7 ng/mL (ref 0.3–4.0)
Relative Index: INVALID (ref 0.0–2.5)
Total CK: 94 U/L (ref 7–177)

## 2011-02-18 LAB — BRAIN NATRIURETIC PEPTIDE: Pro B Natriuretic peptide (BNP): 212 pg/mL — ABNORMAL HIGH (ref 0.0–100.0)

## 2011-02-18 LAB — POCT CARDIAC MARKERS
Myoglobin, poc: 35.9 ng/mL (ref 12–200)
Troponin i, poc: <0.05 ng/mL (ref 0.00–0.09)

## 2011-02-18 LAB — HEPARIN LEVEL (UNFRACTIONATED): Heparin Unfractionated: 0.13 IU/mL — ABNORMAL LOW (ref 0.30–0.70)

## 2011-02-22 LAB — PROTIME-INR
INR: 1.4 (ref 0.00–1.49)
Prothrombin Time: 17.9 seconds — ABNORMAL HIGH (ref 11.6–15.2)

## 2011-02-22 LAB — CBC
MCV: 89.3 fL (ref 78.0–100.0)
Platelets: 212 10*3/uL (ref 150–400)
RBC: 4.31 MIL/uL (ref 3.87–5.11)
WBC: 4.9 10*3/uL (ref 4.0–10.5)

## 2011-02-22 LAB — HEPARIN LEVEL (UNFRACTIONATED): Heparin Unfractionated: 0.74 IU/mL — ABNORMAL HIGH (ref 0.30–0.70)

## 2011-02-23 LAB — POCT I-STAT, CHEM 8
BUN: 19 mg/dL (ref 6–23)
Calcium, Ion: 0.94 mmol/L — ABNORMAL LOW (ref 1.12–1.32)
Hemoglobin: 15.3 g/dL — ABNORMAL HIGH (ref 12.0–15.0)
Sodium: 138 mEq/L (ref 135–145)
TCO2: 21 mmol/L (ref 0–100)

## 2011-02-23 LAB — DIFFERENTIAL
Basophils Absolute: 0 10*3/uL (ref 0.0–0.1)
Eosinophils Absolute: 0 10*3/uL (ref 0.0–0.7)
Eosinophils Absolute: 0.1 10*3/uL (ref 0.0–0.7)
Eosinophils Relative: 1 % (ref 0–5)
Lymphocytes Relative: 12 % (ref 12–46)
Lymphs Abs: 1.4 10*3/uL (ref 0.7–4.0)
Monocytes Absolute: 0.3 10*3/uL (ref 0.1–1.0)
Monocytes Relative: 8 % (ref 3–12)
Neutro Abs: 4.4 10*3/uL (ref 1.7–7.7)
Neutrophils Relative %: 68 % (ref 43–77)

## 2011-02-23 LAB — HEPARIN LEVEL (UNFRACTIONATED)
Heparin Unfractionated: 0.2 IU/mL — ABNORMAL LOW (ref 0.30–0.70)
Heparin Unfractionated: 0.48 IU/mL (ref 0.30–0.70)
Heparin Unfractionated: 0.71 IU/mL — ABNORMAL HIGH (ref 0.30–0.70)

## 2011-02-23 LAB — CBC
HCT: 37.9 % (ref 36.0–46.0)
MCV: 90 fL (ref 78.0–100.0)
MCV: 90.3 fL (ref 78.0–100.0)
Platelets: 210 10*3/uL (ref 150–400)
Platelets: 239 10*3/uL (ref 150–400)
Platelets: 250 10*3/uL (ref 150–400)
RBC: 4.19 MIL/uL (ref 3.87–5.11)
RBC: 4.79 MIL/uL (ref 3.87–5.11)
RDW: 15.8 % — ABNORMAL HIGH (ref 11.5–15.5)
WBC: 4.8 10*3/uL (ref 4.0–10.5)
WBC: 6.5 10*3/uL (ref 4.0–10.5)
WBC: 7.9 10*3/uL (ref 4.0–10.5)

## 2011-02-23 LAB — CK TOTAL AND CKMB (NOT AT ARMC)
Relative Index: INVALID (ref 0.0–2.5)
Total CK: 50 U/L (ref 7–177)

## 2011-02-23 LAB — COMPREHENSIVE METABOLIC PANEL
ALT: 22 U/L (ref 0–35)
AST: 18 U/L (ref 0–37)
Albumin: 3.9 g/dL (ref 3.5–5.2)
Alkaline Phosphatase: 74 U/L (ref 39–117)
CO2: 22 mEq/L (ref 19–32)
Chloride: 105 mEq/L (ref 96–112)
GFR calc Af Amer: >60 mL/min (ref >60)
GFR calc non Af Amer: >60 mL/min (ref >60)
Potassium: 3.6 mEq/L (ref 3.5–5.1)
Sodium: 138 mEq/L (ref 135–145)
Total Bilirubin: 0.7 mg/dL (ref 0.3–1.2)

## 2011-02-23 LAB — CARDIAC PANEL(CRET KIN+CKTOT+MB+TROPI)
Relative Index: INVALID (ref 0.0–2.5)
Total CK: 41 U/L (ref 7–177)
Troponin I: <0.01 ng/mL (ref 0.00–0.06)
Troponin I: <0.01 ng/mL (ref 0.00–0.06)

## 2011-02-23 LAB — PROTIME-INR
INR: 1 (ref 0.00–1.49)
INR: 1 (ref 0.00–1.49)
Prothrombin Time: 13.2 seconds (ref 11.6–15.2)
Prothrombin Time: 13.9 seconds (ref 11.6–15.2)

## 2011-02-23 LAB — POCT CARDIAC MARKERS: Myoglobin, poc: 45.8 ng/mL (ref 12–200)

## 2011-02-23 LAB — TROPONIN I: Troponin I: <0.01 ng/mL (ref 0.00–0.06)

## 2011-02-27 LAB — CBC
HCT: 36.9 % (ref 36.0–46.0)
HCT: 37.9 % (ref 36.0–46.0)
HCT: 38 % (ref 36.0–46.0)
HCT: 39.6 % (ref 36.0–46.0)
HCT: 45.2 % (ref 36.0–46.0)
HCT: 30.4 % — ABNORMAL LOW (ref 36.0–46.0)
HCT: 30.6 % — ABNORMAL LOW (ref 36.0–46.0)
HCT: 30.8 % — ABNORMAL LOW (ref 36.0–46.0)
HCT: 30.9 % — ABNORMAL LOW (ref 36.0–46.0)
Hemoglobin: 12.8 g/dL (ref 12.0–15.0)
Hemoglobin: 12.9 g/dL (ref 12.0–15.0)
Hemoglobin: 10.2 g/dL — ABNORMAL LOW (ref 12.0–15.0)
Hemoglobin: 10.4 g/dL — ABNORMAL LOW (ref 12.0–15.0)
Hemoglobin: 10.4 g/dL — ABNORMAL LOW (ref 12.0–15.0)
MCHC: 33.7 g/dL (ref 30.0–36.0)
MCHC: 34 g/dL (ref 30.0–36.0)
MCV: 94.1 fL (ref 78.0–100.0)
MCV: 94.2 fL (ref 78.0–100.0)
MCV: 95 fL (ref 78.0–100.0)
MCV: 92.1 fL (ref 78.0–100.0)
MCV: 92.4 fL (ref 78.0–100.0)
Platelets: 175 10*3/uL (ref 150–400)
Platelets: 240 10*3/uL (ref 150–400)
Platelets: 246 10*3/uL (ref 150–400)
Platelets: 274 10*3/uL (ref 150–400)
Platelets: 174 10*3/uL (ref 150–400)
Platelets: 205 10*3/uL (ref 150–400)
Platelets: 205 10*3/uL (ref 150–400)
RBC: 3.92 MIL/uL (ref 3.87–5.11)
RBC: 4.03 MIL/uL (ref 3.87–5.11)
RBC: 4.17 MIL/uL (ref 3.87–5.11)
RBC: 3.3 MIL/uL — ABNORMAL LOW (ref 3.87–5.11)
RBC: 3.34 MIL/uL — ABNORMAL LOW (ref 3.87–5.11)
RDW: 13.3 % (ref 11.5–15.5)
RDW: 13.5 % (ref 11.5–15.5)
RDW: 13.7 % (ref 11.5–15.5)
RDW: 14.8 % (ref 11.5–15.5)
RDW: 15.2 % (ref 11.5–15.5)
WBC: 10.2 10*3/uL (ref 4.0–10.5)
WBC: 5.7 10*3/uL (ref 4.0–10.5)
WBC: 6.3 10*3/uL (ref 4.0–10.5)
WBC: 7.2 10*3/uL (ref 4.0–10.5)
WBC: 8.5 10*3/uL (ref 4.0–10.5)
WBC: 8.9 10*3/uL (ref 4.0–10.5)
WBC: 8.9 10*3/uL (ref 4.0–10.5)

## 2011-02-27 LAB — GLUCOSE, CAPILLARY
Glucose-Capillary: 90 mg/dL (ref 70–99)
Glucose-Capillary: 98 mg/dL (ref 70–99)
Glucose-Capillary: 132 mg/dL — ABNORMAL HIGH (ref 70–99)
Glucose-Capillary: 135 mg/dL — ABNORMAL HIGH (ref 70–99)
Glucose-Capillary: 165 mg/dL — ABNORMAL HIGH (ref 70–99)
Glucose-Capillary: 173 mg/dL — ABNORMAL HIGH (ref 70–99)

## 2011-02-27 LAB — CROSSMATCH

## 2011-02-27 LAB — POCT I-STAT 3, ART BLOOD GAS (G3+)
Acid-Base Excess: 1 mmol/L (ref 0.0–2.0)
Acid-base deficit: 1 mmol/L (ref 0.0–2.0)
Bicarbonate: 22.4 mEq/L (ref 20.0–24.0)
O2 Saturation: 100 %
Patient temperature: 35.3
TCO2: 25 mmol/L (ref 0–100)
pCO2 arterial: 31.3 mmHg — ABNORMAL LOW (ref 35.0–45.0)
pCO2 arterial: 32.3 mmHg — ABNORMAL LOW (ref 35.0–45.0)
pCO2 arterial: 37.3 mmHg (ref 35.0–45.0)
pCO2 arterial: 34.9 mmHg — ABNORMAL LOW (ref 35.0–45.0)
pH, Arterial: 7.413 — ABNORMAL HIGH (ref 7.350–7.400)
pH, Arterial: 7.415 — ABNORMAL HIGH (ref 7.350–7.400)
pH, Arterial: 7.442 — ABNORMAL HIGH (ref 7.350–7.400)
pH, Arterial: 7.408 — ABNORMAL HIGH (ref 7.350–7.400)
pO2, Arterial: 343 mmHg — ABNORMAL HIGH (ref 80.0–100.0)

## 2011-02-27 LAB — BASIC METABOLIC PANEL
BUN: 20 mg/dL (ref 6–23)
BUN: 9 mg/dL (ref 6–23)
CO2: 26 mEq/L (ref 19–32)
CO2: 22 mEq/L (ref 19–32)
Calcium: 8.8 mg/dL (ref 8.4–10.5)
Calcium: 8.3 mg/dL — ABNORMAL LOW (ref 8.4–10.5)
Calcium: 8.5 mg/dL (ref 8.4–10.5)
Chloride: 108 mEq/L (ref 96–112)
Chloride: 103 mEq/L (ref 96–112)
Creatinine, Ser: 0.82 mg/dL (ref 0.4–1.2)
GFR calc Af Amer: >60 mL/min (ref >60)
GFR calc Af Amer: >60 mL/min (ref >60)
GFR calc Af Amer: >60 mL/min (ref >60)
GFR calc non Af Amer: 50 mL/min — ABNORMAL LOW (ref >60)
GFR calc non Af Amer: 58 mL/min — ABNORMAL LOW (ref >60)
GFR calc non Af Amer: >60 mL/min (ref >60)
Glucose, Bld: 98 mg/dL (ref 70–99)
Glucose, Bld: 118 mg/dL — ABNORMAL HIGH (ref 70–99)
Glucose, Bld: 141 mg/dL — ABNORMAL HIGH (ref 70–99)
Potassium: 3.5 mEq/L (ref 3.5–5.1)
Potassium: 3.8 mEq/L (ref 3.5–5.1)
Potassium: 4.1 mEq/L (ref 3.5–5.1)
Sodium: 141 mEq/L (ref 135–145)
Sodium: 133 mEq/L — ABNORMAL LOW (ref 135–145)
Sodium: 138 mEq/L (ref 135–145)

## 2011-02-27 LAB — URINALYSIS, MICROSCOPIC ONLY
Glucose, UA: NEGATIVE mg/dL
Hgb urine dipstick: NEGATIVE
Protein, ur: NEGATIVE mg/dL
Specific Gravity, Urine: 1.015 (ref 1.005–1.030)
pH: 6 (ref 5.0–8.0)

## 2011-02-27 LAB — CARDIAC PANEL(CRET KIN+CKTOT+MB+TROPI)
CK, MB: 2.9 ng/mL (ref 0.3–4.0)
Relative Index: INVALID (ref 0.0–2.5)
Total CK: 71 U/L (ref 7–177)
Troponin I: 0.2 ng/mL — ABNORMAL HIGH (ref 0.00–0.06)
Troponin I: 0.24 ng/mL — ABNORMAL HIGH (ref 0.00–0.06)

## 2011-02-27 LAB — POCT I-STAT, CHEM 8
BUN: 9 mg/dL (ref 6–23)
Calcium, Ion: 1.08 mmol/L — ABNORMAL LOW (ref 1.12–1.32)
Calcium, Ion: 1.23 mmol/L (ref 1.12–1.32)
Chloride: 104 mEq/L (ref 96–112)
HCT: 46 % (ref 36.0–46.0)
Hemoglobin: 15.6 g/dL — ABNORMAL HIGH (ref 12.0–15.0)
Sodium: 139 mEq/L (ref 135–145)
TCO2: 24 mmol/L (ref 0–100)

## 2011-02-27 LAB — POCT I-STAT 4, (NA,K, GLUC, HGB,HCT)
Glucose, Bld: 124 mg/dL — ABNORMAL HIGH (ref 70–99)
Glucose, Bld: 82 mg/dL (ref 70–99)
HCT: 18 % — ABNORMAL LOW (ref 36.0–46.0)
HCT: 19 % — ABNORMAL LOW (ref 36.0–46.0)
HCT: 23 % — ABNORMAL LOW (ref 36.0–46.0)
HCT: 36 % (ref 36.0–46.0)
Hemoglobin: 10.5 g/dL — ABNORMAL LOW (ref 12.0–15.0)
Hemoglobin: 6.1 g/dL — CL (ref 12.0–15.0)
Hemoglobin: 6.5 g/dL — CL (ref 12.0–15.0)
Hemoglobin: 7.8 g/dL — CL (ref 12.0–15.0)
Potassium: 3.6 mEq/L (ref 3.5–5.1)
Potassium: 3.7 mEq/L (ref 3.5–5.1)
Potassium: 4.2 mEq/L (ref 3.5–5.1)
Potassium: 4.5 mEq/L (ref 3.5–5.1)
Sodium: 133 mEq/L — ABNORMAL LOW (ref 135–145)
Sodium: 136 mEq/L (ref 135–145)
Sodium: 138 mEq/L (ref 135–145)
Sodium: 141 mEq/L (ref 135–145)
Sodium: 143 mEq/L (ref 135–145)

## 2011-02-27 LAB — MAGNESIUM: Magnesium: 2.2 mg/dL (ref 1.5–2.5)

## 2011-02-27 LAB — COMPREHENSIVE METABOLIC PANEL
AST: 24 U/L (ref 0–37)
Albumin: 3.1 g/dL — ABNORMAL LOW (ref 3.5–5.2)
Alkaline Phosphatase: 60 U/L (ref 39–117)
BUN: 16 mg/dL (ref 6–23)
GFR calc Af Amer: >60 mL/min (ref >60)
Potassium: 3.7 mEq/L (ref 3.5–5.1)
Sodium: 143 mEq/L (ref 135–145)
Total Protein: 5.7 g/dL — ABNORMAL LOW (ref 6.0–8.3)

## 2011-02-27 LAB — PREPARE PLATELETS

## 2011-02-27 LAB — HEMOGLOBIN AND HEMATOCRIT, BLOOD
HCT: 26.7 % — ABNORMAL LOW (ref 36.0–46.0)
Hemoglobin: 9.1 g/dL — ABNORMAL LOW (ref 12.0–15.0)

## 2011-02-27 LAB — POCT I-STAT 3, VENOUS BLOOD GAS (G3P V)
O2 Saturation: 82 %
pCO2, Ven: 33.1 mmHg — ABNORMAL LOW (ref 45.0–50.0)
pH, Ven: 7.408 — ABNORMAL HIGH (ref 7.250–7.300)
pO2, Ven: 46 mmHg — ABNORMAL HIGH (ref 30.0–45.0)

## 2011-02-27 LAB — APTT: aPTT: 37 seconds (ref 24–37)

## 2011-02-27 LAB — POCT CARDIAC MARKERS: Myoglobin, poc: 76.7 ng/mL (ref 12–200)

## 2011-02-27 LAB — BLOOD GAS, ARTERIAL
FIO2: 0.21 %
O2 Saturation: 96.4 %
Patient temperature: 98.6

## 2011-02-27 LAB — PLATELET COUNT: Platelets: 126 10*3/uL — ABNORMAL LOW (ref 150–400)

## 2011-02-27 LAB — BRAIN NATRIURETIC PEPTIDE: Pro B Natriuretic peptide (BNP): 127 pg/mL — ABNORMAL HIGH (ref 0.0–100.0)

## 2011-02-27 LAB — HEPARIN LEVEL (UNFRACTIONATED): Heparin Unfractionated: <0.1 IU/mL — ABNORMAL LOW (ref 0.30–0.70)

## 2011-03-04 ENCOUNTER — Encounter: Payer: Self-pay | Admitting: Cardiovascular Disease

## 2011-03-06 ENCOUNTER — Other Ambulatory Visit: Payer: Self-pay | Admitting: *Deleted

## 2011-03-06 DIAGNOSIS — I739 Peripheral vascular disease, unspecified: Secondary | ICD-10-CM

## 2011-03-07 ENCOUNTER — Encounter: Payer: Self-pay | Admitting: Cardiovascular Disease

## 2011-03-07 ENCOUNTER — Ambulatory Visit (INDEPENDENT_AMBULATORY_CARE_PROVIDER_SITE_OTHER): Payer: Medicare Other | Admitting: Cardiovascular Disease

## 2011-03-07 ENCOUNTER — Encounter (INDEPENDENT_AMBULATORY_CARE_PROVIDER_SITE_OTHER): Payer: Medicare Other | Admitting: *Deleted

## 2011-03-07 VITALS — BP 172/84 | HR 67 | Resp 18 | Ht 63.0 in | Wt 108.0 lb

## 2011-03-07 DIAGNOSIS — I739 Peripheral vascular disease, unspecified: Secondary | ICD-10-CM

## 2011-03-07 DIAGNOSIS — I10 Essential (primary) hypertension: Secondary | ICD-10-CM

## 2011-03-07 DIAGNOSIS — I2510 Atherosclerotic heart disease of native coronary artery without angina pectoris: Secondary | ICD-10-CM

## 2011-03-07 DIAGNOSIS — I6529 Occlusion and stenosis of unspecified carotid artery: Secondary | ICD-10-CM

## 2011-03-07 MED ORDER — AMLODIPINE BESYLATE 5 MG PO TABS: 5.0000 mg | ORAL_TABLET | Freq: Two times a day (BID) | ORAL | Status: AC

## 2011-03-07 NOTE — Patient Instructions (Signed)
Your physician recommends that you schedule a follow-up appointment in: 6 months with Dr. McAlhany.  

## 2011-03-07 NOTE — Assessment & Plan Note (Signed)
Elevated today. Her Norvasc was increased yesterday by Dr. Clelia Croft to 5 mg po BID. She will follow at home and call us if it is not controlled.

## 2011-03-07 NOTE — Assessment & Plan Note (Signed)
Stable. She has not tolerated beta blockers or statins in the past. Continue ASA.

## 2011-03-07 NOTE — Progress Notes (Signed)
History of Present Illness:77 yo WF with history of PAD,  CAD s/p CABG 1/10, HTN, hyperlipidemia, former tobacco abuse and COPD who is here today for PV and cardiac  follow up. I initially saw her in June of 2010 for PV workup. Her cardiac issues have been followed by Dr. Juanda Chance in the past. At the first visit, she was complaining of right leg cramping with long periods of walking but had no claudication with walking short distances. We elected to manage her conservatively with a walking program.  She tells me that she feels great. She has had no cramping in her legs with walking.  She has no rest pain, numbness or ulcerations in the right leg or foot. Her bilateral lower ext varicosities are unchanged. She has no other complaints today. She once again tells me that she prefers to manage her PV disease conservatively at this time.  She has had no chest pain or SOB. No dizziness, near syncope or syncope. Some leg cramping at night. She has not tolerated statins in the past.   Non-invasive studies today with ABI of 0.61 on the right and 0.78 on the left. Carotid artery dopplers are stable with 0-39% RICA stenosis and 40-59% LICA stenosis.   Past Medical History  Diagnosis Date  . COPD (chronic obstructive pulmonary disease)   . Former smoker   . SOB (shortness of breath)     uncertain etiology  . CAD (coronary artery disease)     status post previous anterior wall myocardial infarction, treated with PTCA dx and subsequent non-ST- elevation myocardial infarction with recent bypass surgery, November 29, 2008.   Marland Kitchen HTN (hypertension)   . Hyperlipidemia   . PVD (peripheral vascular disease)     with claudication  . Arrhythmia 02/2009    Paroxysmal Fibrillation  . Colitis, ischemic     Hx of  . Raynaud phenomenon   . Herpes zoster ophthalmicus   . Postherpetic neuralgia   . Iron deficiency   . Colon neoplasm     Family history of    Past Surgical History  Procedure Date  . Coronary artery bypass  graft     11/27/2008  . Appendectomy     Current Outpatient Prescriptions  Medication Sig Dispense Refill  . aspirin 81 MG EC tablet Take 81 mg by mouth daily.        . Coenzyme Q10 100 MG capsule Take 100 mg by mouth daily.        Marland Kitchen docusate sodium (COLACE) 100 MG capsule Take 100 mg by mouth as needed.        . nitroGLYCERIN (NITROSTAT) 0.4 MG SL tablet Place 0.4 mg under the tongue as needed.        . Omega-3 Fatty Acids (FISH OIL) 1200 MG CAPS Take 1 capsule by mouth daily.        Marland Kitchen warfarin (COUMADIN) 2.5 MG tablet Use as directed by the Anticoagulation Clinic.       Marland Kitchen DISCONTD: amLODipine (NORVASC) 5 MG tablet Take 5 mg by mouth 2 (two) times daily.       Marland Kitchen amLODipine (NORVASC) 5 MG tablet Take 1 tablet (5 mg total) by mouth 2 (two) times daily.  30 tablet  11  . DISCONTD: cholecalciferol (VITAMIN D) 1000 UNITS tablet Take 1,000 Units by mouth daily.        Marland Kitchen DISCONTD: furosemide (LASIX) 20 MG tablet Take 20 mg by mouth as needed.        Marland Kitchen DISCONTD:  traMADol (ULTRAM) 50 MG tablet Take 50 mg by mouth as needed.          Allergies  Allergen Reactions  . Beta Adrenergic Blockers   . Cardizem (Diltiazem Hcl)   . Clopidogrel Bisulfate     REACTION: naseau  . Codeine   . Statins     History   Social History  . Marital Status: Widowed    Spouse Name: N/A    Number of Children: N/A  . Years of Education: N/A   Occupational History  . Homemaker    Social History Main Topics  . Smoking status: Former Games developer  . Smokeless tobacco: Not on file   Comment: Tobacco abuse for 65 years, one pack per day, stopped in January 2010.   Marland Kitchen Alcohol Use: Yes     2 alcoholic beverages per day.  . Drug Use: No  . Sexually Active: Not on file   Other Topics Concern  . Not on file   Social History Narrative  . No narrative on file    Family History  Problem Relation Age of Onset  . Colon cancer Father   . Colon cancer Mother     Review of Systems:  As stated in the HPI and  otherwise negative.   BP 172/84  Pulse 67  Resp 18  Ht 5\' 3"  (1.6 m)  Wt 108 lb (48.988 kg)  BMI 19.13 kg/m2  Physical Examination: General: Well developed, well nourished, NAD HEENT: OP clear, mucus membranes moist SKIN: warm, dry. No rashes. Neuro: No focal deficits Musculoskeletal: Muscle strength 5/5 all ext Psychiatric: Mood and affect normal Neck: No JVD, no carotid bruits, no thyromegaly, no lymphadenopathy. Lungs:Clear bilaterally, no wheezes, rhonci, crackles Cardiovascular: Regular rate and rhythm. Slight systolic  Murmur. No gallops or rubs. Abdomen:Soft. Bowel sounds present. Non-tender.  Extremities: No lower extremity edema. Pulses are 2 + in the bilateral DP/PT.  EKG:NSR, rate 67 bpm. Old lateral infarct.

## 2011-03-07 NOTE — Assessment & Plan Note (Signed)
Stable. No claudication. Continue conservative management.

## 2011-03-10 ENCOUNTER — Encounter: Payer: Self-pay | Admitting: Cardiovascular Disease

## 2011-03-28 NOTE — Assessment & Plan Note (Signed)
[name]                            CARDIOLOGY OFFICE NOTE   NAME:HOCKDayanira, Giovannetti                            MRN:          [id number]  DATE:12/15/2008                            DOB:          07-08-1928    Ms. Chausse recently underwent CABG and was discharged on December 01, 2008.  She did well with her surgery.  She is scheduled to see Dr. Juanda Chance on  December 17, 2008.  However, she called today to speak with Dr. Laneta Simmers  about problems that she was having and Dr. Laneta Simmers referred the call to  [place].  We brought Ms. Maalouf in for complete evaluation.  As of today, she  has several concerns.  She has had some mild nausea.  She says that  there is an odor coming from her Plavix tablet.  At this point, her  daughter cannot smell the odor, nor can I, but it is bothersome to the  patient.  She is bothered by her pain medicines.  She also has some  shortness of breath.  There have been some mild palpitations with no  syncope or presyncope.  She also has a cough that is not productive.   The patient did have some volume overload after CABG.  She had received  some Lasix in the hospital, but did not go home on Lasix.   PAST MEDICAL HISTORY:  Allergies to CODEINE and TYLENOL.   MEDICATIONS:  1. Carvedilol 6.25 b.i.d.  2. Plavix 75 mg (to be stopped today).  3. Lipitor 80.  4. Colace.  5. Aspirin 81.   OTHER MEDICAL PROBLEMS:  See the list below.   REVIEW OF SYSTEMS:  See the HPI for the review of systems.  Otherwise,  her review of systems is negative.   PHYSICAL EXAM:  Blood pressure  is unexpectedly high today.  Previously,  she had been on lisinopril and she is not on that at this time.  This  will be restarted.  HEENT:  Reveals no xanthelasma.  She has normal extraocular motion.  There are no carotid bruits.  There is no jugular venous distention.  LUNGS:  Reveal decreased breath sounds at the left base.  CARDIAC:  Exam reveals that her chest wall is nicely  healed.  Cardiac  exam reveals an S1 with an S2.  There are no clicks or significant  murmurs.  The patient's abdomen is soft.  There is no significant peripheral edema.   An EKG today reveals normal sinus rhythm.  Chest x-ray reveals a very  small effusion on the right and a larger effusion on the left.   PROBLEMS:  Include:  #1.  Severe three-vessel coronary disease status post CABG x4 with a  LIMA to the LAD, SVG to the diagonal, SVG to the second OM and SVG to  the posterior descending.  #2.  History of a PTCA done in late 2009.  No stent was placed.  This  was in November 2009.  #3.  History of acute colitis in July 2008.  #4.  COPD.  #5.  History  of polycythemia.  #6.  Hypertension.  Her blood pressure is higher today than we have seen  it and we will restart her lisinopril.  #7.  History of ocular shingles in the past.  #8.  History of tobacco use that she stopped in 2009.  #9.  Mild nausea.  This does not appear to be severe and she will not be  taking meds, although we will give her a prescription for Zofran.  #10.  Unusual smell sensation from her Plavix.  I have carefully  reviewed the issue.  She has not received a stent.  She has undergone  CABG.  There is no absolute indication for her Plavix and I will put it  on hold as of today.  #11.  Shortness of breath with some fluid overload on her chest x-ray.  We will start Lasix 40.  The patient also will cut back her pain meds  and used only pain meds at night.  #12.  Palpitations.  I will not be adjusting her meds any further for  this.  #13.  Mild cough.  I will not be starting antibiotics at this time.  The  patient will have a CBC and a BMET today.  The patient has an  appointment to see Dr. Juanda Chance on December 17, 2008 to follow up the  result of my changes in the labs from today.     Luis Abed, MD, [facility]  Electronically Signed    JDK/MedQ  DD: 12/15/2008  DT: 12/15/2008  Job #: [id number]

## 2011-03-28 NOTE — Assessment & Plan Note (Signed)
[facility]                            CARDIOLOGY OFFICE NOTE   NAME:[name], [name]                            MRN:          [id number]  DATE:12/17/2008                            DOB:          [birth year]    PRIMARY CARE PHYSICIAN:  Kari Baars, MD   CARDIOVASCULAR SURGEON:  Evelene Croon, MD   CLINICAL HISTORY:  [name] is 77 years old and returned for followup  visit after recent bypass surgery.  In November, she had an  anterolateral wall infarction, treated with PTCA of the diagonal branch  to the LAD.  We recommended surgery for multivessel disease which she  preferred to try medical therapy.  She was readmitted in December with a  non-ST-elevation myocardial infarction, and we restudied her and she had  renarrowed the diagonal branch and had progression of disease in the  LAD.  We again recommended surgery and she agreed and Dr. Laneta Simmers  performed the surgery.  She did quite well after surgery.  She went home  and did well initially, but yesterday she developed nausea and a bad  taste in her mouth which she thought was related to Plavix.  She also  had some symptoms of shortness of breath.  Dr. Myrtis Ser stopped the Plavix.  She is now feeling better.  She has had no chest pain.   PAST MEDICAL HISTORY:  Significant for previous tobacco use.  She has a  history of chronic obstructive pulmonary disease, hypertension, and  polycythemia.  She also has history of acute colitis, was seen by Dr.  Lina Sar at that time.  She also has a history of Raynaud phenomenon.   CURRENT MEDICATIONS:  1. Carvedilol 6.25 mg b.i.d.  2. Omega-3.  3. Lipitor 80 mg nightly.  4. Colace.  5. Aspirin 81 mg daily.  6. Lasix 40 mg daily.  7. Lisinopril 10 mg daily.   PHYSICAL EXAMINATION:  VITAL SIGNS:  Blood pressure is 152/86, pulse 76  and regular.  Two days ago, her blood pressure was 208/103.  Last night,  her blood pressure was 200 systolic.  HEENT:  There was  no venous tension.  The carpals were full without  bruits.  CHEST:  Clear, but with mild decreased breath sounds throughout.  HEART:  Rhythm was regular.  There are no murmurs or gallops.  ABDOMEN:  Soft, normal bowel sounds.  EXTREMITIES:  There is no peripheral edema.  Pedal pulses are equal.   IMPRESSION:  1. Coronary artery disease status post previous anterior wall      myocardial infarction, treated with PTCA and subsequent non-ST-      elevation myocardial infarction with recent bypass surgery, November 29, 2008.  2. Hypertension.  3. Hyperlipidemia, intolerance to PLAVIX due to nausea.  4. Shortness of breath, uncertain etiology.  5. Previous cigarette smoking.  6. Chronic obstructive pulmonary disease.  7. Raynaud phenomenon.  8. History of ischemic colitis.   RECOMMENDATIONS:  [name] is doing better since we stopped the Plavix.  Her blood pressure is still  quite high and she still had some shortness  of breath.  We will substitute Norvasc 5 mg or lisinopril 10 mg daily  and will increase her carvedilol to 12.5 mg b.i.d.  If her palpitations  are better, we will get a 24-hour Holter monitor.  I will get the  results of her labs and chest x-ray.  I will plan to see her back in 4  weeks.  We will get a lipid, liver, CBC, and BMP in about 3 weeks after  response to Lipitor.   ADDENDUM:  I do not have the x-ray report from 2 days ago, but Dr. Myrtis Ser  indicated that the x-ray showed a very small effusion on the right and a  larger effusion of the left.  Her laboratory studies including BMP and  CBC were normal.     Bruce R. Juanda Chance, MD, [facility]  Electronically Signed    BRB/MedQ  DD: 12/17/2008  DT: 12/18/2008  Job #: [id number]   cc:   Kari Baars, M.D.  Evelene Croon, M.D.

## 2011-03-28 NOTE — Discharge Summary (Signed)
NAME:  [name], [name]                   ACCOUNT NO.:  [id number]   MEDICAL RECORD NO.:  [id number]          PATIENT TYPE:  INP   LOCATION:  2031                         FACILITY:  [facility]   PHYSICIAN:  Bruce R. Juanda Chance, MD, FACCDATE OF BIRTH:  1928-10-18   DATE OF ADMISSION:  02/08/2009  DATE OF DISCHARGE:  02/11/2009                               DISCHARGE SUMMARY   PRIMARY CARDIOLOGIST:  Everardo Beals. Juanda Chance, MD, [facility]   PRIMARY CARE PHYSICIAN:  Kirstie Peri, MD   PROCEDURES PERFORMED DURING HOSPITALIZATION:  None.   FINAL DISCHARGE DIAGNOSES:  1. Paroxysmal atrial fibrillation.  2. Coronary artery disease.      a.     Status post non-ST-elevated myocardial infarction, January       2010.      b.     Status post cardiac catheterization, January 2010, revealing       severe three-vessel coronary artery disease.      c.     Status post coronary artery bypass grafting per Dr. Evelene Croon, November 27, 2008, with left internal mammary artery to       left anterior descending, saphenous vein graft to diagonal,       saphenous vein graft to second obtuse marginal, saphenous vein       graft to posterior descending.      d.     Status post percutaneous transluminal coronary angioplasty       and angioplasty in November 2009 to totally occluded branch of the       left anterior descending.  3. Hypertension.  4. Ischemic colitis.  5. Chronic obstructive pulmonary disease.  6. History of tobacco abuse.  7. Ocular shingles.  8. Polycythemia.  9. Raynaud's.  10.Hyperlipidemia.   HOSPITAL COURSE:  This is a 77 year old Caucasian female with  complicated past medical history for coronary artery disease to include  the lateral wall infarction and PTCA of the LAD and coronary artery  bypass grafting in January 2009.  The patient was in the usual state of  health until she was at cardiac rehab and was noted to be tachycardiac.  The patient also had complaints of fatigue and weakness.  The nurse  at  Cardiac Rehab found that the patient was in atrial fib with a heart rate  of 140-150 beats per minute.  The patient was sent to the emergency room  where she was evaluated in the ER by Dr. Valera Castle and [name], nurse practitioner.  The patient was started on heparin and  Coumadin.  Her Coreg was increased to 12.5 mg b.i.d. and her Norvasc was  discontinued.  The patient was also started on Cardizem drip and  admitted to monitor throughout hospitalization.  The patient's CHADS  score was found to be 2.   The patient was followed by Dr. Charlies Constable throughout hospitalization  to monitor the patient's response to treatment.  The patient had  Cardizem placed on hold secondary to sinus bradycardia.  Coreg was  decreased in dose and finally taken off  secondary to continued  bradycardia.  The patient was continued on heparin with heparin bridging  to Coumadin and was followed by Pharmacy for adjustment.   The patient remained mildly bradycardiac after Coreg was discontinued.  However, her heart rate remained in the 60s-70s.  The patient's Cardizem  drip was changed to p.o. Cardizem at lower doses to evaluate the  patient's response.  On day of discharge, the patient's Cardizem was  increased to 180 mg CD daily.  Her Lipitor was decreased to 40 mg daily  secondary to myalgias.  The Norvasc which she was taking at home was  placed on hold, and the patient will take her blood pressure twice a  day.  If her systolic blood pressure remains greater than 150 mmHg for  more than 4 times, she is to call our office and this may be  reinstituted prior to admission.  The patient's Norvasc was 5 mg daily.   On day of discharge, the patient was seen and examined by Dr. Charlies Constable and found to be stable.  Blood pressure 148/73 with a heart rate  of 60.  The patient had no evidence of atrial fibrillation , but  remained in sinus bradycardia, sinus rhythm.  The patient was continued  on  Coumadin and will follow up in the Coumadin Clinic as an outpatient.  Recommendations per Pharmacy to take 5 mg on Tuesdays, Thursdays,  Saturdays, and Sundays, and 2 mg on Mondays, Wednesdays, and Fridays.   DISCHARGE LABORATORY DATA:  PT 17.9, INR 1.4, hemoglobin 13.4,  hematocrit 38.5, white blood cells 4.9, platelets 212.  Sodium 138,  potassium 3.6, chloride 105, CO2 22, glucose 128, BUN 17, creatinine  0.68.  Cardiac enzymes were found to be negative at less than 0.01, less  than 0.01, and less than 0.01, respectively.  Chest x-ray completed on  February 08, 2009, revealed cardiac enlargement without acute pulmonary  process.  EKG on discharge sinus bradycardia with a ventricular rate of  51 beats per minute dated February 09, 2009.   DISCHARGE MEDICATIONS:  1. Cardizem CD 180 mg daily.  2. Lipitor 40 mg daily.  3. Aspirin 325 mg daily.  4. Lasix 40 mg daily.  5. Coumadin 5 mg Tuesdays, Thursdays, Saturdays, and Sundays, and 2.5      mg all other days.   ALLERGIES:  The patient is allergic to CODEINE, PLAVIX, ACETAMINOPHEN.   FOLLOWUP PLANS AND APPOINTMENT:  1. The patient will follow up in the Coumadin Clinic on February 16, 2009,      at 9:30 a.m. as a new patient.  A BMET will also be drawn at that      time to evaluate potassium status and renal status.  2. The patient will follow up with Dr. Juanda Chance on February 24, 2009, at      8:45 a.m.  3. The patient will follow up with primary care physician on her own      accord for continued medical management.  4. The patient has been advised to take her blood pressure twice a      day, once in the morning and once in the evening, and record the      results.  The patient will call our office for a systolic blood      pressure greater than 150 if this occurs in more than 4 readings.  5. The patient is to report to our office any bleeding noted in her      stool  or as she is coughing up blood or having nosebleeds.   TIME SPENT WITH THE  PATIENT TO INCLUDE PHYSICIAN TIME:  35 minutes.      Bettey Mare. Lyman Bishop, NP      Everardo Beals. Juanda Chance, MD, [facility]  Electronically Signed    KML/MEDQ  D:  02/11/2009  T:  02/11/2009  Job:  [id number]   cc:   Kirstie Peri, MD

## 2011-03-28 NOTE — Assessment & Plan Note (Signed)
[facility]                         GASTROENTEROLOGY OFFICE NOTE   NAME:HOCKKenyetta, Wimbish                            MRN:          [id number]  DATE:06/24/2007                            DOB:          01/03/1928    Ms. Malak is a 77 year old white female who is here for follow-up after a  hospitalization at [facility] between May 03, 2007 to May 06, 2007  for ischemic colitis. She presented with acute abdominal pain and rectal  bleeding, and was found to have tenderness in left lower quadrant which  resolved after several days of bowel rest. She was found to be mildly  iron deficient with a 16% iron saturation but polycythemic due to  smoking, hematocrit of 51.3. She gives history of smoking of 60 years  and drinking alcohol steadily. She also has a positive family history of  colon cancer and had a remote colonoscopy many years ago. She had herpes  zoster in her right eye and has postherpetic neuralgia. On chest x-ray  her changes were consistent with COPD. She is trying to reduce her  smoking but has not stopped completely.   MEDICATIONS:  None.   PAST MEDICAL HISTORY:  As in history of present illness.   FAMILY HISTORY:  Positive for colon cancer in her mother and father.   OPERATIONS:  Appendectomy.   SOCIAL HISTORY:  Divorced. Smokes 1 pack of cigarettes a day and 2  drinks every night.   REVIEW OF SYSTEMS:  Positive for headaches. No GI symptoms currently.   PHYSICAL EXAMINATION:  Blood pressure 130/70, pulse 72, and weight 118  pounds. She was alert, oriented, and in no distress.  Sclera nonicteric.  LUNGS: Decreased breath sounds.  COR: With normal S1, S1.  ABDOMEN: Soft, relaxed, nontender with palpable fullness in right upper  quadrant, most likely part of the colon, either stool or colon. It was  nontender and it could not be palpated all the time, just in certain  positions. It did not appear to be a hernia.  RECTAL EXAM: Not done.  EXTREMITIES: No edema.   IMPRESSION:  81. A 77 year old white female status post ischemic colitis, now      resolved and asymptomatic.  2. Positive family history of colon cancer in both parents.   PLAN:  Patient ought to have a colonoscopy to evaluate her colon because  of abnormal abdominal exam, as well as because of family history of  colon cancer, as well as the resent rectal bleeding attributed to  ischemic colitis. She however is quite reluctant, in fact she is going  out of town for several months to [place], and will be back in  October. She agreed to respond to a recall letter in October to have a  colonoscopy done when she returns.     Hedwig Morton. Juanda Chance, MD  Electronically Signed    DMB/MedQ  DD: 06/24/2007  DT: 06/25/2007  Job #: [id number]   cc:   Zenon Mayo, MD

## 2011-03-28 NOTE — H&P (Signed)
NAME:  [name], [name]                   ACCOUNT NO.:  [id number]   MEDICAL RECORD NO.:  [id number]          PATIENT TYPE:  INP   LOCATION:  2910                         FACILITY:  [facility]   PHYSICIAN:  Doylene Canning. Ladona Ridgel, MD    DATE OF BIRTH:  1928/06/21   DATE OF ADMISSION:  09/27/2008  DATE OF DISCHARGE:                              HISTORY & PHYSICAL   PRIMARY CARE PHYSICIAN:  Kari Baars, MD   CHIEF COMPLAINT:  Chest pain.   HISTORY OF PRESENT ILLNESS:  [name] is a 77 year old lady with history  of tobacco abuse and recently diagnosed hypertension who came on with  chest pain.  Chest pain started at around 1:30 p.m. and she came to the  ER, brought by the family at around 4 p.m.  It feels like pressures,  which is 6/10 to start with, but at the time of examination is 8/10.  It  radiates to right arm and back.  The patient was watching TV when she  was having chest pain.  She has some shortness of breath.  She vomited  once and has perspiration.  There is no recent history of surgery or  family history of bleeding disorder.   ALLERGIES:  She is allergic to CODEINE, which gives her rash.   PAST MEDICAL HISTORY:  1. History of acute colitis in July 2008.  2. COPD.  3. Polycythemia.  4. Tobacco and alcohol abuse.  5. Status post appendectomy, total abdominal hysterectomy, and D&C.  6. Ocular shingle.  7. Hypertension diagnosed within last week.   HOME MEDICATIONS:  None.   SOCIAL HISTORY:  [name] lives by herself in [place].  She is  retired.  She has 120-pack years of smoking.  She drinks about 2 Martini  daily.   FAMILY HISTORY:  Noncontributory.   REVIEW OF SYSTEMS:  Positive for chills, but otherwise per history of  present illness.   PHYSICAL EXAMINATION:  VITAL SIGNS:  Pulse 64, respiration 33, blood  pressure 144/80, and ox saturation 102 L.  GENERAL:  She is in mild distress.  Pupils PERRLA.  Extraocular muscles  warm and intact.  Oropharynx without erythema  or exudates.  NECK:  Supple without JVD.  CARDIOVASCULAR:  Heart rate regular rhythm.  There is grade 2/6 systolic  murmur, which is prominent in right-sided intercostal space.  Pedal  pulses 1+.  LUNGS:  Clear to auscultation bilaterally.  No crackles or wheeze.  ABDOMEN:  Soft and nontender.  No organomegaly.  EXTREMITIES:  No edema.  NEUROLOGIC:  Alert and oriented x3.   EKG positive for normal sinus rhythm with a rate of 76 with ST elevation  in lead I, aVL, and V2 with ST-depression II, III, VF, V3, 4, 5, 6.  Chest x-ray:  No acute changes.   LABORATORIES:  WBC 15 with absolute neutrophil count of 12.9, hemoglobin  15.8, and platelets 281.  Sodium 139, potassium 3.5, chloride 104,  bicarb 22, BUN 17, creatinine 0.81, glucose 111, HDL 92, LDL 129,  calcium 9.2, magnesium 2.2, PTT 12.5, PT 21, and INR  0.9.   ASSESSMENT AND PLAN:  [name] is a 77 year old lady with past medical  history of tobacco abuse and recently diagnosed hypertension, came in  with chest pain.  1. ST-elevated myocardial infarction.  The patient already had      aspirin, nitroglycerin, and heparin.  In the ER, we will start on      beta-blocker and taken directly to the cath lab.  Depending on the      coronary anatomy further treatment depends.  2. Smoking.  We will get smoking cessation consult.  3. Alcohol abuse.  We will start normal Librium protocol for      observation only.  We will start on folate and thiamine.      Jason Coop, MD  Electronically Signed      Doylene Canning. Ladona Ridgel, MD  Electronically Signed    YP/MEDQ  D:  09/27/2008  T:  09/28/2008  Job:  [id number]   cc:   Kari Baars, M.D.  Bruce Elvera Lennox Juanda Chance, MD, [facility]

## 2011-03-28 NOTE — H&P (Signed)
NAME:  [name], [name]                   ACCOUNT NO.:  [id number]   MEDICAL RECORD NO.:  [id number]          PATIENT TYPE:  INP   LOCATION:  2031                         FACILITY:  [facility]   PHYSICIAN:  Jesse Sans. Wall, MD, FACCDATE OF BIRTH:  12-26-1927   DATE OF ADMISSION:  02/08/2009  DATE OF DISCHARGE:                              HISTORY & PHYSICAL   PRIMARY CARDIOLOGIST:  Everardo Beals. Juanda Chance, MD, [facility]   PRIMARY CARE PHYSICIAN:  Dr. Sherryll Burger.   SURGEON:  Evelene Croon, MD   [name] is a 77 year old Caucasian female with known history of  coronary artery disease, status post anterior lateral wall infarction in  November 2009, treated with PTCA of the diagonal branch to the LAD.  At  that time, we recommended surgery for multivessel disease, but the  patient referred to try medical therapy.  She was readmitted in December  with a non-ST-elevated MI, underwent cardiac catheterization again that  showed renarrowed diagonal branch and progression of the disease in the  LAD.  We again recommended surgery.  The patient agreed and Dr. Laneta Simmers  performed the surgery.  The patient did quite well.  She followed up in  the office in early February and saw Dr. Juanda Chance.  Her Plavix was  discontinued as the patient felt she had developed a reaction to it.  It  was causing nausea and a bad taste in her mouth.  The patient states she  felt better when Plavix was stopped.  However, blood pressure was quite  high and Dr. Juanda Chance add Norvasc to her medications at that time.  [name] states she was doing quite well.  She started cardiac rehab last  week.  Today was in cardiac rehab and she was noted to be tachycardic.  The nurse evaluated the patient and the patient was found to be in  atrial fib at a rate of 140s-150s.  She complained of some fatigue and  generalized weakness.  Denies any other symptoms.  The patient was sent  up to the emergency room to get evaluated here in the ER.  She is  continued to have a  burst of atrial fib and a controlled rate and has  denied any chest discomfort.   PAST MEDICAL HISTORY:  Coronary artery disease as stated above, status  post coronary artery bypass grafting x4 on November 27, 2008, by Dr.  Laneta Simmers, hypertension, ischemic colitis, COPD, history of tobacco abuse,  just quit in November 2009, appendectomy, ocular shingles, polycythemia,  and Raynaud's.   SOCIAL HISTORY:  She lives in [place] alone.  She is retired.  She is  divorced.  She quit smoking in November 2009.  She has 2 martinis every  evening, follows a heart healthy diet.   FAMILY HISTORY:  Positive for colon CA in both parents.   REVIEW OF SYSTEMS:  Positive for generalized fatigue and weakness today.  Otherwise, all systems reviewed and negative.   ALLERGIES:  CODEINE and PLAVIX.   MEDICATIONS AT HOME:  1. Colace.  2. Lasix 40.  3. Aspirin 81.  4. Lipitor  80.  5. Omega-3.  6. Coreg 6.25 b.i.d.  7. Amlodipine 5.   PHYSICAL EXAMINATION:  VITAL SIGNS:  Temperature 97.8, heart rate 94,  respirations 20, blood pressure 177/73, and sat 97% on room air.  GENERAL:  No acute distress.  HEENT: Unremarkable.  CHEST:  Clear to auscultation bilaterally.  CARDIOVASCULAR:  An S1 and S2.  Irregular rhythm.  ABDOMEN:  Soft and nontender.  Positive bowel sounds.  EXTREMITIES:  Without clubbing, cyanosis, or edema.  NEUROLOGIC:  Alert and oriented x3.   Chest x-ray is pending.  EKG atrial fib at a rate of 147.   LABORATORY WORK:  H and H was 14.7 and 43.1, WBC 7.9, and platelet  250,000.  Sodium 138, potassium 3.8, creatinine 0.8, and glucose 127.  Point of care negative x1.   IMPRESSION:  1. Atrial fibrillation with rapid ventricular response, new onset with      a CHADS score of 2.  2. Hypertension.  3. Raynaud's.   PLAN:  To admit the patient.  We will initiate heparin and Coumadin  therapy.  Increase her Coreg to 12.5 (will hold on switching to  metoprolol secondary to history of  Raynaud's).  We will, however,  discontinue Norvasc and started on Cardizem.  A long discussion with the  patient regarding the risks and benefits of her Coumadin and atrial  fibrillation.  The patient is agreeable to proceed with anticoagulation  therapy.  Dr. Valera Castle has been into examine and assess the patient  and agrees with plan of care.      [name], ACNP      Jesse Sans. Daleen Squibb, MD, [facility]  Electronically Signed    MB/MEDQ  D:  02/08/2009  T:  02/09/2009  Job:  [id number]

## 2011-03-28 NOTE — Cardiovascular Report (Signed)
NAME:  [name], [name]                   ACCOUNT NO.:  [id number]   MEDICAL RECORD NO.:  [id number]          PATIENT TYPE:  INP   LOCATION:  1823                         FACILITY:  [facility]   PHYSICIAN:  Everardo Beals. Juanda Chance, MD, FACCDATE OF BIRTH:  May 28, 1928   DATE OF PROCEDURE:  DATE OF DISCHARGE:                            CARDIAC CATHETERIZATION   CLINICAL HISTORY:  [name] is 77 year old and has no prior history of  heart disease.  [name] recently saw Dr. Clelia Croft and had the recent diagnosis  of hypertension.  [name] developed the onset of chest pain today at 1:45  and came to the emergency room with EKG changes showing an anterolateral  infarction.  [name] was brought to the cath lab for angiography and  possible intervention.   PROCEDURE:  The procedure was performed by the right femoral arteries,  arterial sheath, and 6-French preformed coronary catheters.  At the  completion of the diagnostic study, we were not certain about the  culprit and so we did a left ventriculogram.  This showed anterolateral  wall akinesis and we felt occluded diagonal branch was the culprit.  [name]  also had occlusion of the right coronary artery which we thought was  probably a chronic total.  The patient had been given 3000 units of  heparin in the emergency department and 4 chewable aspirin.  We gave her  bivalirudin bolus and infusion.  We used a Q-4 6-French guiding catheter  with side holes.  We were able to cross the totally occluded diagonal  branch with a Prowater wire without too much difficulty.  We dilated the  lesion with a 2.0 x 20-mm apex balloon performing three inflations up to  7 atmospheres for 30 seconds.  Followup angiography was then performed  through the guiding catheter.  We elected not to place a stent because  the vessel was very small and because it was an ostial lesion and there  was disease in the LAD and stenting the ostium would have likely  compromised the LAD.  My thinking was that [name] had  multivessel disease  and surgery might be a better option, so we elected to use PTCA only.  [name] tolerated the procedure well and left the laboratory in satisfactory  condition.  The right femoral artery was closed with Angio-Seal at the  end of the procedure.   RESULTS:  The left main coronary artery and the right main coronary  artery was free of disease   Left anterior descending artery:  Left anterior descending artery gave  rise to a first diagonal branch, which was totally occluded.  There were  5 septal perforators and there was a second diagonal branch in the  distal portion of the vessel.  There was a long area of segmental  disease from the proximal to the mid-LAD that was 80% narrowed.  There  was 90% ostial stenosis in the diagonal branch and total occlusion just  beyond the diagonal branch.   Circumflex artery:  The circumflex artery gave rise to a marginal  branch, two small marginal branches, an atrial branch,  and two  posterolateral branches.  There was 80% ostial stenosis.  There was 30%  narrowing in the mid and 40% narrowing in the distal vessel.   Right coronary artery:  The right coronary artery had a 90% proximal  occlusion and then supplied to right ventricular branch and then  appeared to possibly be completely occluded.  It was difficult to tell  but this may have been a flush occlusion with the right ventricular  branch.  The distal right coronary filled via collaterals from the LAD  and it appeared to have an AV branch, which headed back toward the right  coronary artery.   Left Ventriculogram: The left ventriculogram was performed on RAO  projection showed hypokinesis to akinesis of the anterolateral wall.  The apex was spared and the inferior wall moved vigorously.  The  estimated ejection fraction was 40%.   Following PTCA of the lesion, the diagonal branch, the totally occluded  proximal portion improved from 100% to 30% and the ostial portion   improved from 90% to 40%.   The patient had the onset of chest pain at 1:45, arrived to the  emergency room at 4 p.m.  [name] arrived into the cath lab at 4:38 p.m. and  the first balloon inflation was at 5:14 p.m.  This gave a total balloon  time of 74 minutes and a refusion time of 3 hours and 29 minutes.   CONCLUSION:  1. Acute anterior wall myocardial infarction with total occlusion of      the first diagonal branch of the LAD, a long area of 80% narrowing      in the proximal to mid LAD, 80% ostial stenosis in the circumflex      artery with 30% and 40% narrowing in the mid circumflex artery, and      total occlusion of the right coronary with filling by collaterals      and anterolateral wall akinesis with an estimated ejection fraction      of 40%.  2. Successful PTCA of the totally occluded diagonal branch of the LAD      with improvement of center narrowing from 100% to 30% and      improvement in the ostial stenosis from 90% to 40%.   DISPOSITION:  The patient returned to post angio room for further  observation.  I think surgical revascularization may be the patient's  best option when [name] recovers from her infarct.  For this reason, we  will not give her Plavix and we will cover her with Integrilin over the  next 12 hours.  We did close the right femoral artery with Angio-Seal.      Bruce R. Juanda Chance, MD, [facility]  Electronically Signed     BRB/MEDQ  D:  09/27/2008  T:  09/28/2008  Job:  [id number]   cc:   Nicholes Stairs. Ladona Ridgel, MD

## 2011-03-28 NOTE — Assessment & Plan Note (Signed)
[facility]                            CARDIOLOGY OFFICE NOTE   NAME:[name], [name]                            MRN:          [id number]  DATE:10/22/2008                            DOB:          25-Feb-1928    PRIMARY CARE PHYSICIAN:  Jessica Baars, MD   CLINICAL HISTORY:  [name] is a 77 year old and was recently admitted  to the hospital with an acute anterolateral infarction.  She had  multivessel disease and underwent PTCA of the diagonal branch.  The  vessel was not stented because it was small and would have compromised  the LAD, and we thought she would probably require surgery.  She did  well initially and she was in consultation by Dr. Laneta [name] for possible  surgery, but after some discussion of the risks, she decided not to  proceed with this.  She has done well since that time, has had no  recurrent chest pain, shortness of breath, or palpitations.   PAST MEDICAL HISTORY:  Significant for previous tobacco use, although  she has quit now.  She has had chronic obstructive pulmonary disease,  hypertension, and polycythemia.  She also has a history of acute  ischemic colitis in July 2008, was seen by Dr. Lina [name].  She has a  history of Raynaud phenomenon.   CURRENT MEDICATIONS:  1. Carvedilol 6.25 mg b.i.d.  2. Aspirin 325 mg daily.  3. Omega-3.  4. Plavix 75 mg daily.  5. Lipitor 80 mg daily.  6. Lisinopril 10 mg daily.   SOCIAL HISTORY:  She has moved here from the [place].  Her daughter,  [name], is a Chief Executive Officer and her son-in-law, Jessica Patty, MD, is  an anesthesiologist at [employer].   PHYSICAL EXAMINATION:  VITAL SIGNS:  Today, blood pressure is 160/90 and  pulse 54 and regular.  NECK:  There is no venous distension.  The carotid pulses are full  without bruits.  CHEST:  Clear.  CARDIAC:  Rhythm is regular.  There are no murmurs or gallops.  ABDOMEN:  Soft with normal bowel sounds.  EXTREMITIES:  The right femoral  artery had a knot, but there is no  widened pulsation.  Pedal pulses are equal and there is no peripheral  edema.  Her hands are discolored and somewhat cyanotic.   Her electrocardiogram showed Q-wave in lead I and aVL and V2 indicative  of lateral infarct.   IMPRESSION:  1. Coronary artery disease status post recent anterolateral wall      myocardial infarction with percutaneous transluminal coronary      angioplasty of the diagonal branch to left anterior descending      coronary artery and residual three-vessel coronary artery disease      with good overall left ventricular function.  2. Hyperlipidemia.  3. Hypertension.  4. Previous cigarette smoking.  5. Raynaud phenomenon.  6. History of ischemic colitis.   RECOMMENDATIONS:  I think, Ms. Mcinroy is doing quite well.  Her blood  pressure is a little bit high and I suggested that she get a home cuff,  since she thinks it is elevated in the doctor's office, but not at home.  We will gradually increase her activity.  She plans to get into a rehab  program after Christmas.  I will see her back in 6 weeks.  We will plan  to a lipid and liver profile prior to that visit.  She indicated she is  scheduled to see Dr. Clelia [name] in March.     Bruce Elvera Lennox Juanda Chance, MD, [facility]  Electronically Signed    BRB/MedQ  DD: 10/22/2008  DT: 10/23/2008  Job #: [id number]   cc:   [name], M.D.

## 2011-03-28 NOTE — Consult Note (Signed)
NAME:  [name], [name]                   ACCOUNT NO.:  [id number]   MEDICAL RECORD NO.:  [id number]          PATIENT TYPE:  INP   LOCATION:  2926                         FACILITY:  [facility]   PHYSICIAN:  Evelene Croon, M.D.     DATE OF BIRTH:  [birth year]   DATE OF CONSULTATION:  11/26/2008  DATE OF DISCHARGE:                                 CONSULTATION   REFERRING PHYSICIAN:  Everardo Beals. Juanda Chance, MD, [facility]   REASON FOR CONSULTATION:  Severe three-vessel coronary artery disease  status post non-ST-segment elevation myocardial infarction.   CLINICAL HISTORY:  I was asked by Dr. Juanda Chance to evaluate [name] for  consideration of coronary artery bypass graft surgery.  She is a 77-year-  old woman with history of smoking and coronary artery disease who  initially presented in November 2009 with an acute anterolateral MI.  Catheterization at that time showed severe three-vessel coronary disease  with the culprit appearing to be an occluded diagonal coronary artery.  This was treated with PTCA without a stent, since it was felt the  patient would probably require coronary artery bypass surgery for three-  vessel disease.  She remains stable without chest pain after the  procedure.  I saw her in consultation on September 28, 2008.  I felt that  she was a high-risk patient given her age, history of heavy smoking with  ongoing smoking, low albumin of 2.9 on admission and history of ischemic  colitis in the past.  I discussed the operative procedure with her and  her family.  Her daughter was present as was her son-in-law who is an  Designer, industrial/product at [employer].  After further followup, she decided  that she would rather continue medical therapy.  She was discharged home  and did fairly well until the past week when she has had some  intermittent episodes of left anterior chest discomfort as well as some  shortness of breath.  These episodes have occurred with rest and with  exertion.  She said that she  was scheduled to begin cardiac rehab today  and was unfamiliar with the hospital, so she decided to come down here  to the hospital yesterday to see what we had was.  She parked in the  parking garage and had to walk down 2 flights of stairs and then into  the hospital.  She returned back up 2 flights of stairs up to her car  and said that she had intermittent chest discomfort the whole time while  she was walking.  She began driving home and had worsening discomfort as  well some nausea and generalized weakness.  She called her daughter who  brought her to the emergency room.  She ruled in for a non-ST segment  elevation MI.  Her initial troponin was 0.31 and her CPK-MB was 3.6 with  a myoglobin of 76.7.  Her second set of enzymes showed a troponin of  0.24 with a CPK of 71 and MB of 2.9.  They have continued to drop since  then.  She underwent cardiac catheterization this morning  which showed  severe three-vessel coronary artery disease as noted previously on  catheterization in November 2009.  Unfortunately, the diagonal branch  had restenosis to about 95%.  The patient also appeared to have some  progression in the proximal LAD.  Ejection fraction about 55% with  anterolateral hypokinesis.  She has continued to have some intermittent  episodes of chest pain this afternoon after the catheterization.   REVIEW OF SYSTEMS:  GENERAL:  She denies any fever or chills.  She has  had no recent weight changes.  She has been eating well.  She denies  fatigue.  EYES:  Negative.  ENT:  Negative.  ENDOCRINE:  She denies  diabetes and hypothyroidism.  CARDIOVASCULAR:  As above.  She has had  left anterior chest pain and shortness of breath with and without  exertion over the past several days.  She has had no PND or orthopnea.  She denies peripheral edema and palpitations.  RESPIRATORY:  She denies  cough and sputum production.  GI:  She reports some nausea yesterday  associated with the chest pain  but no vomiting.  She denies any melena  or bright red blood per rectum.  She does have a history of ischemic  colitis in July 2008 with bloody stool which resolved without treatment.  GU:  She denies dysuria and hematuria.  MUSCULOSKELETAL:  She denies  arthralgias and myalgias.  NEUROLOGICAL:  She denies any focal weakness  or numbness.  She denies dizziness and syncope.  She has never had a TIA  or stroke.  VASCULAR:  She has had some pain in her cast with ambulation  that sounds like to be claudication.  This has been relieved with rest.  She has never had DVT, phlebitis, or vein stripping.  ALLERGIES:  To  CODEINE which gives her a rash.  PSYCHIATRIC:  Negative.  HEMATOLOGICAL:  Negative.   PAST MEDICAL HISTORY:  Significant for coronary artery disease as  mentioned above.  She has a history of hypertension.  She has a history  of ischemic colitis in July 2008.  It was recommended that she have  colonoscopy but she refused and has not returned for followup.  She has  a history of polycythemia.  She is status post appendectomy.  She is  status post total abdominal hysterectomy and D and C.  She has a history  of ocular shingles in the past.  She is status post surgery on her knee  in the past.   MEDICATIONS:  As noted in her medication administration record.  Of  significance, she has been on Plavix 75 mg daily since her PTCA.   SOCIAL HISTORY:  She lives by herself in [place].  She is retired.  She has a 120-pack year smoking history but quit smoking after her  admission in November 2009.  She drinks at least 1 martini per day.  As  mentioned above, her son-in-law is Dr. Soyla Dryer of  Anesthesiology at [employer] [facility].   FAMILY HISTORY:  Negative for cardiac disease.  There is a family  history of colon cancer.   PHYSICAL EXAMINATION:  VITAL SIGNS:  Her blood pressure is 160/90 and  her pulse is 70 and regular.  Respiratory rate is 16 and unlabored.  GENERAL:  She is an  elderly small framed and frail-appearing white  female in no distress.  HEENT:  Normocephalic and atraumatic.  Pupils are equal and reactive to  light and accommodation.  Extraocular muscles are intact.  Her throat is  clear.  NECK:  Normal carotid pulses bilaterally.  There are no bruits.  There  is no adenopathy or thyromegaly.  CARDIAC:  Regular rate and rhythm.  There is no murmur, rub, or gallop.  LUNGS:  Clear.  ABDOMEN:  Active bowel sounds.  Abdomen is soft, flat, and nontender.  There are no palpable masses or organomegaly.  EXTREMITIES:  No peripheral edema.  Dorsalis pedis pulses palpable on  the right.  Left pedal pulses are not palpable.  SKIN:  Warm and dry.  NEUROLOGIC:  Alert and oriented x3.  Motor and sensory exams are grossly  normal.   CHEST X-RAY:  COPD with clear lung fields.   LABORATORY DATA:  A TSH of 3.23, which is within normal range.  CBC  shows a white blood cell count of 7.2, hemoglobin 12.5, and platelet  count 240,000.  Her BNP was 127.  Coagulation profile was normal.  Her  liver function profile was within normal limits.  Electrolytes were  normal with a BUN of 16 and creatinine of 0.81.  Her albumin was 3.1.   IMPRESSION:  [name] has severe three-vessel coronary artery disease  now presenting with acute anterolateral non-ST-segment elevation  myocardial infarction.  Her previous culprit vessel appeared to be the  diagonal branch and I suspect that as still the culprit vessel.  She  also has high-grade left anterior descending stenosis, moderate left  circumflex disease, and an occluded right coronary artery collateralized  from the left.  She has failed medical therapy and continues to have  chest pain at rest.  I think it would be best to proceed with coronary  artery bypass graft surgery to prevent further ischemia or infarction,  relieve her chest pain, and improve her quality of life.  She is at  increased risk for bleeding due to being on  Plavix, but she has  continued to have intermittent chest pain this afternoon and I do not  think it would be wise to wait 5 days for Plavix to washout.  Her  operative risk is increased due to her age, chronic obstructive  pulmonary disease, history of smoking, as well as her previous history  of ischemic colitis.  I discussed the operative procedure of coronary  artery bypass surgery with her and her son-in-law and daughter again  including alternatives, benefits, and risks including but not limited to  bleeding, blood transfusion, infection, stroke, myocardial infarction,  graft failure, recurrent ischemic colitis with intra-abdominal  complications, heart block requiring permanent pacemaker, and death.  She understands all this and would like to proceed with surgery.  We  will plan to do this tomorrow afternoon.      Evelene Croon, M.D.  Electronically Signed     BB/MEDQ  D:  11/26/2008  T:  11/27/2008  Job:  [id number]

## 2011-03-28 NOTE — Discharge Summary (Signed)
NAME:  [name], [name]                   ACCOUNT NO.:  [id number]   MEDICAL RECORD NO.:  [id number]          PATIENT TYPE:  INP   LOCATION:  2005                         FACILITY:  [facility]   PHYSICIAN:  Everardo Beals. Juanda Chance, MD, FACCDATE OF BIRTH:  [birth year]   DATE OF ADMISSION:  06/26/2009  DATE OF DISCHARGE:  06/28/2009                               DISCHARGE SUMMARY   PROCEDURES:  None.   PRIMARY FINAL DISCHARGE DIAGNOSIS:  Chest pain, cardiac enzymes negative  for myocardial infarction and outpatient Myoview planned.   SECONDARY DIAGNOSES:  1. Status post aortocoronary bypass surgery in January 2010, with left      internal mammary artery to left anterior descending, saphenous vein      graft to second obtuse marginal, saphenous vein graft to posterior      descending artery.  2. Hypertension.  3. Ischemic colitis.  4. History of peripheral vascular disease.  5. Hyperlipidemia.  6. Raynaud phenomenon.  7. Paroxysmal atrial fibrillation.  8. Chronic Coumadin.  9. Chronic obstructive pulmonary disease.  10.Polycythemia.  11.Remote history of tobacco use.   ALLERGY OR INTOLERANCE:  1. CODEINE.  2. PLAVIX.   TIME AT DISCHARGE:  41 minutes.   HOSPITAL COURSE:  [name] is a 77 year old female with a history of  coronary artery disease.  She had chest pain few days before admission  that was intermittent and came to the hospital where she was admitted  for further evaluation and treatment.   Her cardiac enzymes were negative for MI.  Her INR on admission was 1.8,  so she was placed on full anticoagulation with Lovenox, which was cut  back to DVT prophylaxis once her INR increased.  The IV nitroglycerin  was discontinued and she was held overnight.  On June 28, 2009, she  had had no further episodes of chest pain or shortness of breath.  Her  Norvasc was increased and Lasix 20 mg daily was added for better blood  pressure control.  She was evaluated by Dr. Juanda Chance and considered  stable  for discharge with close outpatient followup.   DISCHARGE INSTRUCTIONS:  Her activity level is to be increased  gradually.  She is to stick to a low-sodium heart-healthy diet.  On  July 01, 2009.  She has a Coumadin checked at 8:30 and a stress test  at 9:00.  She is to see Dr. Juanda Chance on July 16, 2009, 3:00 p.m.  She  is to see Dr. Clelia Croft as needed.   DISCHARGE MEDICATIONS:  Per the computerized discharge med list.      Theodore Demark, PA-C      Bruce R. Juanda Chance, MD, [facility]  Electronically Signed    RB/MEDQ  D:  06/28/2009  T:  06/28/2009  Job:  [id number]   cc:   Kari Baars, M.D.  Kirstie Peri, MD

## 2011-03-28 NOTE — H&P (Signed)
NAME:  Jessica, [name]                   ACCOUNT NO.:  [id number]   MEDICAL RECORD NO.:  [id number]          PATIENT TYPE:  INP   LOCATION:  2926                         FACILITY:  [facility]   PHYSICIAN:  Hillis Range, MD       DATE OF BIRTH:  09/09/1928   DATE OF ADMISSION:  11/25/2008  DATE OF DISCHARGE:                              HISTORY & PHYSICAL   PRIMARY CARDIOLOGIST:  Everardo Beals. Juanda Chance, MD, [facility]   CHIEF COMPLAINT:  Chest pain.   HISTORY OF PRESENT ILLNESS:  Today, the patient reports chest  pain/pressure, onset with exertion, radiating to the left back,  associated with shortness of breath, nausea, diaphoresis.  The patient  took 2 aspirin 81 mg each and 1 nitroglycerin with no relief for 30  minutes.  The patient was in the emergency department by the time the  pain resolved.  The patient's daughter took her blood pressure at the  time of her chest pain and her pressure was 200/100 with a pulse that  was within normal limits per daughter.  Currently, the patient is  asymptomatic with intermittent chest pain 4-5/10 lasting only a few  minutes, worse in certain positions.  Slightly different than chest pain  that brought her into the hospital.  The patient reports  epigastric/sternal burning chest pain recently that significantly  improved with belching, this is also significantly different from pain  that brought her to the emergency department.  Also, she has had some  fleeting lasting seconds, chest pain and she also describes the pain was  significantly different from the chest pain that brought her in today.   PAST MEDICAL HISTORY:  1. Coronary artery disease status post successful PTCA to diagonal      branch of the LAD with reduction of 100% stenosis to 30% and      reduction of ostial stenosis 90% to 40%.  Proximal LAD, 80% long      area of stenosis, 80% ostial stenosis and circ and 30 to 40% in mid      circ, total occlusion of RCA with filling from collaterals.  Anterolateral wall akinesis at that time.  The patient's cath was      completed in November 2009.  2. History of acute colitis in July 2008.  3. COPD.  4. Polycythemia.  5. Hypertension.  6. Ocular shingles.  7. Tobacco abuse remote, quit in November 2009.   PAST SURGICAL HISTORY:  The patient had;  1. Appendectomy.  2. Hysterectomy.  3. D&C.   SOCIAL HISTORY:  The patient lives in [place].  She is retired.  She  has an 120-pack year smoking history, quit in November 2009.  She has 2  Bradly Bienenstock a day.  No illicit drug use.  No herbal medications.  Regular  diet.  No regular exercise.   FAMILY HISTORY:  Noncontributory.   REVIEW OF SYSTEMS:  The patient is currently slightly lightheaded off  and on.  All other systems were reviewed and were negative.   ALLERGIES:  The patient is allergic to CODEINE.   MEDICATIONS:  1. Lisinopril 10 mg p.o. daily.  2. Carvedilol 6.25 mg p.o. b.i.d.  3. Lipitor 80 mg p.o. daily.  4. Plavix 75 mg p.o. daily.  5. Aspirin 81 mg p.o. daily.  In the emergency department, she got 2      inch Nitropaste applied.   PHYSICAL EXAMINATION:  VITAL SIGNS:  Temp 97.8 degrees Fahrenheit, pulse  72, BP 196/98, when last checked it was 160/80, respiration rate 16, O2  saturation 96% on room air.  GENERAL:  She was alert and oriented x3 in no apparent distress.  She  can move and breath easily during the exam.  HEENT:  Head was normocephalic and atraumatic.  Pupils equal, round, and  reactive to light.  Extraocular muscles were intact.  Nares were patent  without discharge.  Oropharynx was without erythema or exudate.  NECK:  Supple without lymphadenopathy.  No thyromegaly, no bruits, no  JVD.  No lymphadenopathy noted on exam.  HEART:  Rate was regular, audible S1 and S2.  No murmurs, rubs, gallops,  or clicks.  EXTREMITIES:  Pulses were 2+ and equal in both upper and lower  extremities bilaterally.  No clubbing, cyanosis, or edema.  LUNGS:  Clear to  auscultation bilaterally with no hyperresonance on  percussion.  SKIN:  No rashes, lesions or petechiae.  ABDOMEN:  Soft, nontender, normal abdominal bowel sounds.  No rebound or  guarding and no hepatosplenomegaly.  MUSCULOSKELETAL:  No joint deformity or effusions.  No spinal or CVA  tenderness.  NEUROLOGIC:  Cranial nerves II through XII were grossly intact.  Strength was 5/5 in all extremities and axial groups.  Normal sensation  throughout.  Normal cerebellar function.   Chest x-ray showed cardiomegaly with pulmonary venous hypertension.  Heart size has increased from last exam.  Bibasilar subsegmental  atelectasis, suspect COPD.   EKG showed a sinus rhythm with a rate of 74 with normal axis.  No T-wave  inversion in V2, however, no significant changes from EKG performed in  November 2009 except for poor R-wave progression.  PR interval was 172,  QRS 90, QTc at 475.  No evidence of hypertrophy.   LABORATORY DATA:  WBC 5.7, HGB 15.0, HCT 45.2, PLT count 274.  Sodium  139, potassium 4.4, chloride 107, CO2 of 24, BUN 18, creatinine 0.8,  glucose 102.  First set of cardiac markers were positive with CK-MB of  3.6 and troponin I of 0.31.  Total cholesterol was 161, triglycerides  57, HDL 82, LDL 67.  Total bili was 1.0, alk phos was 57, ALT was 22,  albumin was 3.8, total protein was 6.5.   ASSESSMENT AND PLAN:  The patient is well known to Dr. Juanda Chance, status  post recent anterior myocardial infarction in November 2009, requiring  PTCA of large diagonal branch.  She has severe 3-vessel disease, but  declined surgery, now has anxiety in the setting of BP 200/100.  No new  EKG changes.  First set of cardiac markers, troponin 0.31.  The plan is  to admit and rule out myocardial infarction.  1. We will increase lisinopril to 20 mg p.o. daily and Coreg to 12.5      mg p.o. b.i.d.  2. Continue with Nitropaste.  3. Start heparin drip per Pharmacy.  4. We will discuss with Dr. Juanda Chance,  could manage medically if cardiac      markers remain negative and chest pain is relieved.      Jarrett Ables, [facility]  Hillis Range, MD  Electronically Signed    MS/MEDQ  D:  11/25/2008  T:  11/26/2008  Job:  [id number]

## 2011-03-28 NOTE — Assessment & Plan Note (Signed)
OFFICE VISIT   [name], Jessica  DOB:  [birth year]                                        January 05, 2009  CHART #:  [id number]   The patient returns today for followup status post coronary artery  bypass graft surgery x4 on 11/27/2008.  She has been feeling well  overall.  She said that she has been lifting firewood over the past  several days and this has made her chest sore.  Her energy level has  continued to improve and her shortness of breath has also improved.  She  is anxious to start cardiac rehabilitation.   PHYSICAL EXAMINATION:  VITAL SIGNS:  Today, her blood pressure is 164/84  and her pulse is 68 and regular.  Respiratory rate is 18 and unlabored.  Oxygen saturation on room air is 97%.  GENERAL:  She looks well.  CARDIAC:  Regular rate and rhythm with normal heart sounds.  LUNGS:  Slight decreased breath sounds at the left base.  Chest incision  is healing well and the sternum is stable.  EXTREMITIES:  Her leg incision is healing well.  There is mild right  lower leg edema.   Followup chest x-ray today shows continued improvement in the bilateral  pleural effusions and basal atelectasis.  The right pleural effusion  appears essentially completely resolved.  There is minimal left pleural  fluid blunting in the costophrenic angle.   IMPRESSION:  Overall, the patient is recovering well following her  surgery.  I encouraged her to continue walking as much as possible and  told her she can start cardiac rehabilitation.  I asked her not to lift  anything heavier than 10 pounds for a total of 3 months from date of  surgery.  She  will continue to follow up with Dr. Charlies Constable and will contact me if  she develops any problems with her incisions.   Evelene Croon, M.D.  Electronically Signed   BB/MEDQ  D:  01/05/2009  T:  01/05/2009  Job:  [id number]   cc:   Everardo Beals. Juanda Chance, MD, [facility]

## 2011-03-28 NOTE — Cardiovascular Report (Signed)
NAME:  [name], [name]                   ACCOUNT NO.:  [id number]   MEDICAL RECORD NO.:  [id number]          PATIENT TYPE:  INP   LOCATION:  2926                         FACILITY:  [facility]   PHYSICIAN:  Jessica R. Juanda Chance, MD, FACCDATE OF BIRTH:  Feb 21, 1928   DATE OF PROCEDURE:  11/26/2008  DATE OF DISCHARGE:                            CARDIAC CATHETERIZATION   CLINICAL HISTORY:  [name] is 77 year old and had a previous  anterolateral wall myocardial infarction in November 2009 treated with  PTCA of the diagonal branch.  She had severe three-vessel disease and  surgery was recommended but she decided she wanted to try medical  therapy.  She developed recurrent chest pain over the last several days  and yesterday had a prolonged episode of chest pain and was admitted  with positive consistent with a non-ST-elevation myocardial infarction.  She had recurrent chest pain today and developed lateral T-wave  inversions.  We brought her to the lab for evaluation.   PROCEDURE:  The procedure was performed via the right femoral artery and  arterial sheath and 5-French preformed coronary catheters.  A front wall  arterial puncture was performed and Omnipaque contrast was used.  The  patient tolerated the procedure well and left the laboratory in  satisfactory condition.   RESULTS:  The left main coronary artery.  The left main coronary artery  was free of significant disease.   The left anterior descending artery.  The left anterior descending  artery gave rise to a moderate-sized diagonal branch, a septal  perforator and several more septal perforators and two smaller diagonal  branches.  The diagonal branch had a 70% ostial and 95% proximal  stenosis.  This was the infarct related artery from her previous  procedure.  There was also a 50% narrowing in the LAD at the takeoff of  the diagonal branch.  There was a long segmental area of 80% narrowing  in the proximal to mid LAD after the diagonal  branch.  The whole vessel  was moderately heavily calcified.   The circumflex artery.  The circumflex artery gave rise to a marginal  branch and two large posterolateral branches.  There was 70 - 80%  stenosis in the proximal circumflex artery near the ostium before the  marginal branch.  There was 40% narrowing in the mid-to-distal vessel  before the two posterolateral branches.   The right coronary artery.  The right coronary artery was completely  occluded in the midportion after two right ventricular branches.  There  was a 95% stenosis in the proximal to midportion of the vessel.  The  distal right coronary artery filled via collaterals from the left  coronary artery and consisted of posterior descending branch.   The left ventriculogram.  The left ventriculogram performed in the RAO  projection showed hypo to akinesis of the anterolateral wall.  The  overall wall motion was good with an estimated ejection fraction of 35%.   HEMODYNAMIC DATA:  The aortic pressure was 156/22 and left ventricular  pressure was 156/77 with a mean of 110.   CONCLUSION:  Severe three-vessel coronary artery disease with 50%  proximal and 80% proximal to mid LAD stenosis with 95% restenosis of the  first diagonal branch, 70 - 80% stenosis in the proximal circumflex  artery with 40% narrowing in the mid-to-distal vessel and total  occlusion of the right coronary artery with anterolateral wall hypo to  akinesis and an estimated ejection fraction of 55%.   RECOMMENDATIONS:  I think surgical revascularization is the patient's  best option.  We will plan to discuss this further with her.      Jessica Elvera Lennox Juanda Chance, MD, [facility]  Electronically Signed     BRB/MEDQ  D:  11/26/2008  T:  11/26/2008  Job:  [id number]   cc:   [name], M.D.  Cardiopulmonary Lab

## 2011-03-28 NOTE — H&P (Signed)
NAME:  [name], [name]                   ACCOUNT NO.:  [id number]   MEDICAL RECORD NO.:  [id number]          PATIENT TYPE:  INP   LOCATION:  2927                         FACILITY:  [facility]   PHYSICIAN:  Wendi Snipes, MD DATE OF BIRTH:  11-13-1928   DATE OF ADMISSION:  06/27/2009  DATE OF DISCHARGE:                              HISTORY & PHYSICAL   CARDIOLOGIST:  Dr. Juanda Chance.   PRIMARY DOCTOR:  Dr. Clelia Croft.   CHIEF COMPLAINTS:  Chest pain.   HISTORY OF PRESENT ILLNESS:  This is a 77 year old white female with a  recent history of coronary artery bypass graft in January and peripheral  vascular disease here with chest pressure for the past few days.  She  states that the pain is somewhat different than her previous pain  associated with myocardial infarction though has some similarities.  She  states her symptoms are not exertional in nature, though the pain has  been increasing in frequency and severity over the past days.  She  states that her blood pressure since the surgery has been remarkably  controlled, though over the past few days there is been some elevation,  and she reports elevated blood pressures throughout the day for the last  few days.  Otherwise, she denies syncope, palpitations, paroxysmal  nocturnal dyspnea, though she does state she noticed an increase in  lower extremity edema.  She also describes a left focal chest wall  tenderness since her surgery that is tender to palpation and is  positional and actually feels that there is something pulling when she  takes deep breath.   PAST MEDICAL HISTORY:  1. Coronary disease status post coronary artery bypass graft November 27, 2008.  She had a LIMA to the LAD, saphenous vein graft to the      OM-2, saphenous grain graft to the PDA.  2. Hypertension.  3. Ischemic colitis.  4. History of peripheral vascular disease and lower extremity seen by      Dr. Clifton James.  5. Hyperlipidemia.  6. Raynaud's phenomenon.  7.  Paroxysmal atrial fibrillation on Coumadin therapy.  8. COPD.  9. Polycythemia.   ALLERGIES:  CODEINE AND PLAVIX.   MEDICATIONS ON ADMISSION:  1. Omega-3 fatty acids.  2. Nitroglycerin as needed.  3. Aspirin 81 mg.  4. Norvasc 5 mg daily.  5. Lipitor 40 mg daily.  6. Coumadin 5 mg Tuesday, Thursday, Saturday and Sunday and 2.5 mg      Monday, Wednesday and Friday.  7. Lasix 40 mg as needed.   SOCIAL HISTORY:  She lives in [place] alone.  She is currently  retired.  She has a 120 pack-year history and quit in November 2009.   FAMILY HISTORY:  Her mother and father both had colon cancer.   REVIEW OF SYSTEMS:  All 14 systems were reviewed were negative except as  mentioned in detail in HPI.   PHYSICAL EXAMINATION:  VITAL SIGNS:  Blood pressure is 177/73,  respiratory rate is 15, pulse is 80 beats per minute, satting 99% on  room air.  GENERAL:  She is a 77 year old white female appearing stated age.  No  acute distress.  HEENT:  Moist mucous membranes.  Pupils equal, round, react to light  accommodation.  Anicteric sclerae.  NECK:  No jugular venous distention.  No thyromegaly.  CARDIOVASCULAR:  Regular rate and rhythm.  No murmurs, rubs or gallops.  She has a well-healed median sternotomy scar and she is tender to  palpation at the left fifth sternal costal margin.  LUNGS:  Clear to auscultation bilaterally.  ABDOMEN:  Nontender, nondistended.  Positive bowel sounds.  No masses.  EXTREMITIES:  No clubbing, cyanosis, edema, 2+ pulses throughout.  NEUROLOGIC:  Alert and x3.  Cranial nerves II-XII grossly intact.  No  focal neurologic deficit.  SKIN:  Warm, dry and intact.  No rashes.  Psych, mood and affect are  appropriate.   RADIOLOGY:  Chest x-ray showed COPD chronically with no acute  cardiopulmonary process.  EKG showed a normal sinus rhythm with a rate  of 88 beats per minute with nonspecific ST and T-wave abnormalities.   LABORATORY DATA:  White blood cell count  5.7, hematocrit 4.3, platelets  238, potassium is 3.9, creatinine 0.7.  Her BNP is 212.  Her troponin is  less than 0.05.  Her INR is 1.8.   ASSESSMENT/PLAN:  This is a 77 year old white female of a history of  coronary disease status post recent coronary artery bypass grafts.  He  was chest pain concerning for crescendo angina.  1. Unstable angina.  She has no objective evidence of ischemia though      her story is worrisome.  Will start her on full-dose      anticoagulation since her INR is less than 2.0.  Will continue      Coumadin therapy at this time for paroxysmal atrial fibrillation      and other medications including aspirin.  Her chest pain may      actually to be due to an exacerbation of her elevated blood      pressure though she may benefit from a repeat cardiac      catheterization to establish graft patency.  2. Hypertension.  Her blood pressure is currently not at goal, and it      is unclear why her blood pressure is elevated and may be due to      some noncardiac etiology.  Will start nitroglycerin drip for her      low level continued pain and see how her blood pressure does.      Additionally, we will start an ACE inhibitor as she was on prior to      her surgery.      Wendi Snipes, MD  Electronically Signed     BHH/MEDQ  D:  06/27/2009  T:  06/27/2009  Job:  [id number]

## 2011-03-28 NOTE — Consult Note (Signed)
NAME:  [name], [name]                   ACCOUNT NO.:  [id number]   MEDICAL RECORD NO.:  [id number]          [name] TYPE:  INP   LOCATION:  2920                         FACILITY:  [facility]   PHYSICIAN:  Evelene Croon, M.D.     DATE OF BIRTH:  [birth year]   DATE OF CONSULTATION:  09/28/2008  DATE OF DISCHARGE:                                 CONSULTATION   REASON FOR CONSULTATION:  Severe three-vessel coronary artery disease,  status post acute anterolateral MI.   CLINICAL HISTORY:  I was asked by Dr. Juanda Chance to evaluate [name] for  consideration of coronary artery bypass graft surgery.  She is a 77-year-  old woman with a long history of smoking, recently diagnosed  hypertension, who reports having an episode of chest pain a couple weeks  ago that resolved without treatment.  She was in her usual state of  health on Jessica afternoon of September 27, 2008, when she developed severe  substernal chest pressure radiating into her right arm and back while  raking leaves.  She has some associated shortness of breath and vomiting  and diaphoresis.  This was started about 1:30 p.m., and she was  reluctant to come to Jessica emergency room and was brought finally around  4:00 p.m.  Her electrocardiogram showed an acute anterolateral MI, and  her initial enzymes were positive.  She was taken to Jessica cath lab  immediately by Dr. Juanda Chance and this showed significant three-vessel  coronary artery disease with Jessica culprit appearing to be a completely  occluded diagonal branch.  This was opened with PTCA, but no stent was  placed since [name] was felt to require coronary artery bypass  surgery for three-vessel coronary artery disease.  She had a long 80%  proximal and mid-LAD stenosis with some calcification.  Left circumflex  had about 80% ostial stenosis.  There were few small marginal branches  and a large posterolateral branch.  Jessica right coronary artery looked  like a relatively small vessel that was  occluded after Jessica acute margin.  Jessica distal vessel filled by collaterals from Jessica LAD.  Left ventricular  ejection fraction was about 40% with a significant anterolateral wall  defect.  After opening of her diagonal branch, her symptoms resolved.  Her initial CPK was 1046 with an MB of 109.5 and a troponin of 18.86.  This has progressively decreased to 824 with an MB of 53 and her  troponin has risen to 30.65.  She has had no further chest pain.   REVIEW OF SYSTEMS:  GENERAL:  She denies any fever or chills.  She has  had no recent weight changes.  She denies any history of fatigue.  EYES:  Negative.  ENT:  Negative.  ENDOCRINE:  She denies diabetes and  hyperthyroidism.  CARDIOVASCULAR:  As above.  She denies any history of  exertional dyspnea.  She denies PND or orthopnea.  She has had no  peripheral edema or palpitations.  RESPIRATORY:  She denies cough or  sputum production.  GI:  She has had no nausea or vomiting  until her  episode of chest pain on admission.  She vomited once.  She denies a  recent melena or bright red blood per rectum.  She does have a history  of an episode of ischemic colitis in July 2008 with bloody stool, which  resolved without treatment.  Her last colonoscopy was many years ago.  GU:  She denies dysuria and hematuria.  MUSCULOSKELETAL:  She denies  arthralgias or myalgias.  NEUROLOGICAL:  She denies any focal weakness  or numbness.  She denies dizziness or syncope.  She has never had a TIA  or stroke.  VASCULAR:  She does report some pain in her calf muscles  with ambulation suggesting possible claudication.  This is relieved with  rest.  She has never had phlebitis or DVT.  Allergies to CODEINE which  gives her a rash.  PSYCHIATRIC:  Negative.  HEMATOLOGICAL:  Negative.   Past medical history is significant for recently diagnosed hypertension.  She has history of ischemic colitis in July 2008 that resolved without  treatment.  She has had no further  episodes.  It was recommended by Dr.  Lina Sar that she have colonoscopy, but she refused and has not  returned for followup.  She has a history of polycythemia.  She is  status post appendectomy, total abdominal hysterectomy, and D and C.  She has a history of ocular shingles in Jessica past.  She is status post  surgery on her knee in Jessica past.   MEDICATIONS PRIOR TO ADMISSION:  None.   SOCIAL HISTORY:  She lives by herself in [place].  She is retired.  She reports a 120-pack-year smoking history.  She said she currently  smokes about 16 cigarettes per day.  She drinks two martinis daily.   Family history is negative for cardiac disease.  There is a family  history of colon cancer.   PHYSICAL EXAMINATION:  VITAL SIGNS:  Her blood pressure is 130/75, and  her pulse is 65 and regular.  Respiratory rate is 16 and unlabored.  GENERAL:  She is an elderly, small-frame, frail-appearing white female  in no distress.  HEENT:  Normocephalic and atraumatic.  Pupils are equal and reactive to  light and accommodation.  Extraocular muscles are intact.  Throat is  clear.  NECK:  Normal carotid pulses bilaterally.  There were no bruits.  There  is no adenopathy or thyromegaly.  CARDIAC:  Regular rate and rhythm.  There is no murmur, rub, or gallop.  LUNGS:  Clear.  ABDOMEN:  Active bowel sounds.  Her abdomen is soft, flat, and  nontender.  There are no palpable masses or organomegaly.  EXTREMITY:  No peripheral edema.  Her right dorsalis pedis pulse is  palpable.  Right posterior tibial pulse is not palpable.  Left pedal  pulses are not palpable.  SKIN:  Warm and dry.  NEUROLOGIC:  Alert and oriented x3.  Motor and sensory exams are grossly  normal.   IMPRESSION:  [name] has significant three-vessel coronary artery  disease, status post acute anterolateral myocardial infarction secondary  to diagonal occlusion.  This has been opened with percutaneous  transluminal coronary angioplasty.   I agree with Dr. Juanda Chance that her  coronary anatomy would be most amenable to coronary artery bypass graft  surgery.  Since she has three-vessel disease, this would be Jessica best  long-term treatment for her.  Her operative risk is increased due to her  age, ongoing smoking, with a heavy smoking history, relative  malnutrition with an albumin of 2.9 on admission, and a history of  ischemic colitis.  Her perioperative risk of pulmonary complications or  further episode of ischemic colitis is increased.  With her cardiac  disease and long-time smoking history, she likely has diffuse vascular  disease which would put her at increased risk for vascular  complications.  I discussed Jessica operative procedure with her and her  family.  We discussed all Jessica above including possible complications.  I  told her that I thought there was about a 5% risk of having a  significant complication or death related to surgery.  She is very  hesitant to undergo any type of surgery and asked about whether medical  treatment would be an option for her.  Her left anterior descending and  left circumflex stenoses are significant, but not critical and I believe  that a course of medical therapy would be a reasonable option, although  I think she is at significant risk for further coronary ischemia within  Jessica next year or two.  She would like to think about this further and  discuss it with her family and Dr. Juanda Chance before making a final  decision.      Evelene Croon, M.D.  Electronically Signed     BB/MEDQ  D:  09/28/2008  T:  09/29/2008  Job:  [id number]   cc:   Everardo Beals. Juanda Chance, MD, Charlott Holler, M.D.

## 2011-03-28 NOTE — Op Note (Signed)
NAME:  [name], [name]                   ACCOUNT NO.:  [id number]   MEDICAL RECORD NO.:  [id number]          PATIENT TYPE:  INP   LOCATION:  2303                         FACILITY:  [facility]   PHYSICIAN:  Evelene Croon, M.D.     DATE OF BIRTH:  03/19/1928   DATE OF PROCEDURE:  11/27/2008  DATE OF DISCHARGE:                               OPERATIVE REPORT   PREOPERATIVE DIAGNOSIS:  Severe three-vessel coronary artery disease  status post non-ST-segment elevation myocardial infarction.   POSTOPERATIVE DIAGNOSIS:  Severe three-vessel coronary artery disease  status post non-ST-segment elevation myocardial infarction.   OPERATIVE PROCEDURES:  Median sternotomy, extracorporeal circulation,  coronary artery bypass graft surgery x4 using a left internal mammary  artery graft to the left anterior descending coronary artery, with a  saphenous vein graft to the diagonal branch of the LAD, a saphenous vein  graft to the second obtuse marginal branch of the left circumflex  coronary artery, and a saphenous vein graft to the posterior descending  branch of the right coronary artery.  Endoscopic vein harvesting from  the right leg.   ATTENDING SURGEON:  Evelene Croon, MD   ASSISTANT:  Coral Ceo, PA-C   ANESTHESIA:  General endotracheal.   CLINICAL HISTORY:  This patient is a 77 year old woman with known  coronary disease who I initially saw in November 2009 when she presented  with a non-ST-segment elevation MI.  Catheterization at that time showed  severe three-vessel disease with the culprit appearing to be an occluded  diagonal branch.  This was opened with PTCA.  A stent was not placed  because it was felt the patient would probably require coronary bypass  surgery.  The patient was at somewhat increased risk for surgery, but  was felt to be an operative candidate, but after discussion with her,  she decided that she did not want to have surgery and wanted to continue  medical therapy.  She has  done relatively well on medical therapy until  recently when she developed recurrent chest pain prompting admission.  She ruled in for a small non ST-segment elevation MI.  Repeat  catheterization showed severe three-vessel disease noted before.  The  LAD had 80% proximal stenosis.  The diagonal branch that was occluded  previously had restenosed to about 95%.  This was a relatively small  vessel.  The left circumflex had about 80% proximal stenosis and gave  off first and second marginal branches.  Neither of these had  significant stenosis.  They appeared to communicate well at  catheterization.  The right coronary artery was occluded chronically  with left-to-right collaterals filling the posterior descending branch.  Left ventricular ejection fraction was about 55% with no mitral  regurgitation.  There was anterolateral hypokinesis.  After review of  the catheterization, I felt that coronary artery bypass graft surgery  was the best treatment.  The patient was continuing to have intermittent  substernal chest pain post catheterization and I felt it will be best to  proceed with surgery today despite being on Plavix preoperatively.  I  discussed the operative  procedure with her and her son and daughter, her  son-in-law and daughter.  We discussed alternatives, benefits, and risks  including, but not limited to bleeding, blood transfusion, infection,  stroke, myocardial infarction, graft failure, and death.  She understood  and agreed to proceed.   OPERATIVE PROCEDURE:  The patient was taken to the operating room and  placed on table in supine position.  After induction of general  endotracheal anesthesia, a Foley catheter was placed in the bladder  using sterile technique.  Then, the chest, abdomen, and both lower  extremities were prepped and draped in usual sterile manner.  The chest  was opened through a median sternotomy incision, the pericardium opened  in midline.  Examination  of the heart showed good ventricular  contractility.  The ascending aorta had no palpable plaques in it.   Then, the left internal mammary artery was harvested from the chest wall  as pedicle graft.  This was a medium-caliber vessel with excellent blood  flow through it.  At the same time, a segment of greater saphenous vein  was harvested from the right leg using endoscopic vein harvest  technique.  This vein was of medium caliber and good quality.   Then, the patient was heparinized and when an adequate activated  clotting time was achieved, the distal ascending aorta was cannulated  using a 20-French aortic cannula for arterial inflow.  Venous outflow  was achieved using a 2-stage venous cannula through the right atrial  appendage.  Antegrade cardioplegia and vent cannula was inserted in the  aortic root.   The patient was placed on cardiopulmonary bypass and distal coronaries  identified.  The LAD was a large graftable vessel with minimal plaque  distally.  The diagonal branch was a small diffusely diseased vessel.  There was one spot in the midportion that was suitable for grafting  given the relatively small size and diffuse disease, I am not sure if  this graft will stay over there very long.  Since this was the culprit  vessel, I decided to graft it.  The first marginal was a medium-size  vessel.  The second marginal was a large vessel with no distal disease  in it.  The right coronary artery was diffusely diseased.  The posterior  descending artery was located proximally and was a moderate size  graftable vessel.   Then, the aorta was crossclamped and 500 mL of cold blood antegrade  cardioplegia was administered in the aortic root with quick arrest of  the heart.  Systemic hypothermia to 20 degrees centigrade and topical  hypothermic iced saline was used.  A temperature probe was placed in  septum insulating the pad in the pericardium.   The first distal anastomosis was  performed to the second marginal  branch.  The internal diameter was about 2 mm.  The conduit used was a  segment of greater saphenous vein, anastomosis formed in end-to-side  manner using continuous 7-0 Prolene suture.  Flow noted through the  graft was excellent.   Second distal anastomosis was performed to the posterior descending  coronary artery.  The internal diameter was 1.6 mm.  Conduit used was a  second segment of greater saphenous vein.  Anastomosis formed end-to-  side manner using continuous 7-0 Prolene suture.  Flow noted through the  graft was excellent.  Then, another dose of cardioplegia weakened down  the vein grafts and aortic root.   Third distal anastomosis was performed to the diagonal branch.  The  internal diameter of this vessel was about 1.25 mm.  The conduit used  was a third segment of greater saphenous vein and the anastomosis formed  end-to-side manner using continuous 7-0 Prolene suture.  Flow noted  through the graft was fair.   The fourth distal anastomosis was performed to the distal LAD.  The  internal diameter of this vessel was about 1.752 mm.  There was a small  amount of plaque noted beyond the anastomotic site, but the left  internal mammary pedicle was not long enough to reach down to the apex  of the heart.  A 1.5-mm probe passed easily through this area.  The  conduit used was the left internal mammary artery, and this was brought  through an opening in the left pericardium and anterior to the phrenic  nerve.  This anastomosed to the LAD in end-to-side manner using  continuous 8-0 Prolene suture.  The pedicle was sutured to the  epicardium with 6-0 Prolene sutures.  The patient rewarmed to 37 degrees  centigrade.  With a crossclamp in place, 3 proximal vein graft  anastomosis were performed.  The aortic root end-to-side manner using  continuous 6-0 Prolene suture.  Then, the clamp was removed from mammary  pedicle.  There was rapid warming of  the ventricular septum and return  of spontaneous ventricular fibrillation.  Crossclamp was removed with a  time of 73 minutes and the patient spontaneously converted to sinus  rhythm.  The proximal and distal anastomoses appeared hemostatic and  allowed the grafts satisfactory.  Graft marker was placed around the  proximal anastomoses.  Two temporary right ventricular and right atrial  pacing wire was placed above the skin.   The patient rewarmed to 37 degrees centigrade.  She was weaned on  cardiopulmonary bypass on no inotropic agents.  Total bypass time was 89  minutes.  Cardiac function appeared excellent with a cardiac output of 4  L/minute.  Protamine was given and venous and aortic cannulas were  removed without difficulty.  Hemostasis was achieved.  The patient was  given 1 unit of platelet transfusion since she had been on Plavix  preoperatively.  Then, 3 chest tubes were placed with tube in the  posterior pericardium, one in the left pleural space, one in the  anterior mediastinum.  The sternum was closed with #6 stainless steel  wires.  The fascia was closed with continuous #1 Vicryl suture.  Subcutaneous tissue was closed with continuous 2-0 Vicryl and skin with  3-0 Vicryl subcuticular closure.  Lower extremity vein harvest site was  closed in layers in similar manner.  The sponge, needle, and instrument  counts were correct according to the nurse.  Dry sterile dressing was  applied over the incisions around the chest tubes, which were hooked to  Pleur-Evac suction.  The patient remained hemodynamically stable, and  was transported to the SICU in guarded, but stable condition.      Evelene Croon, M.D.  Electronically Signed     BB/MEDQ  D:  11/27/2008  T:  11/28/2008  Job:  [id number]   cc:   Everardo Beals. Juanda Chance, MD, [facility]

## 2011-03-28 NOTE — H&P (Signed)
NAME:  [name], [name]                   ACCOUNT NO.:  [id number]   MEDICAL RECORD NO.:  [id number]          PATIENT TYPE:  INP   LOCATION:  6709                         FACILITY:  [facility]   PHYSICIAN:  Hedwig Morton. Juanda Chance, MD     DATE OF BIRTH:  [birth year]   DATE OF ADMISSION:  05/03/2007  DATE OF DISCHARGE:                              HISTORY & PHYSICAL   CHIEF COMPLAINT:  24 hours of left-sided abdominal pain with watery and  bloody diarrhea.   HISTORY OF PRESENT ILLNESS:  [name] is a pleasant 76 year old white  female.  She has not seen a primary care doctor in decades.  She seems  to be in good health despite smoking a pack of cigarettes a day, having  begun smoking at age 29.  She also drinks about two martinis a day.  Despite this she has no chronic medical complaints.  She does not take  any medications.   On the day prior to presenting to the emergency room the patient  developed left lower quadrant abdominal pain which was crampy in nature  and persistent.  She does say that she has had similar pain not nearly  as intense which occurs before she has a bowel movement but when she has  a bowel movement normally the pain goes away.  Also with this acute  persistent left lower quadrant pain, she develops diarrhea.  It was  watery and eventually she began passing blood.  There was nausea but no  emesis.  She felt diaphoretic.  Because of the bleeding and the  persistence of symptoms up to 24 hours liter she presented to the  emergency room on June 20 in the late afternoon/evening for evaluation.  A CT scan performed showed thickening in the left colon consistent with  either infectious or ischemic colitis.  Her white blood cell count was  somewhat elevated at 14.6 and the differential showed a left shift.  She  was not febrile.   Dr. Lina Sar was covering unassigned GI call for [facility].  She  evaluated and admitted the patient for treatment of colitis.   ALLERGIES:   None.   MEDICATIONS:  None prior to admission.   PAST MEDICAL HISTORY:  1. Chronic smoking which began at age 75.  She still smokes a pack a      day.  2. She had her appendix out at age 5.  3. She is also status post total abdominal hysterectomy.  4. She is status post D&C remotely.  5. Status post remote colonoscopy screening in her early 30s.  This      was done secondary to positive family history.  6. History of ocular shingles.   SOCIAL HISTORY:  The patient is divorced.  She is part of the [place], having relocated from [place] a couple of years ago.  Her  son is an anesthesiologist here in [place], [place].  She  drinks two martinis a day.  She goes through about half a gallon of  liquor every two weeks.  She smokes  one pack per day.   FAMILY HISTORY:  Her dad died of colon cancer at age 24.  Her mother  died of colon cancer at age 33.   REVIEW OF SYSTEMS:  NEUROLOGIC:  No headaches.  No dizziness, no  fainting.  No history of TIAs or strokes.  GENERAL:  No weight loss.  Appetite generally is good.  She is highly functional.  She lives  independently and she still drives an automobile.  PULMONARY:  Denies  significant shortness of breath or productive cough.  She has never had  to use inhaler therapies.  CARDIOVASCULAR:  No chest pain.  No history  of cardiac symptoms or workups, but again she has not been to see a  doctor in many, many years.  GYN:  Has never had a mammogram.  Her last  pelvic was a pelvic exam and Pap smear was probably when she was in her  30s.  She denies any vaginal discharge or vaginal bleeding.  DERMATOLOGIC:  Denies unusual rashes or lesions on her skin.  Denies  pruritus.  Other review of systems completed and negative.   LABORATORY:  Sodium 138, potassium 3.8, chloride 107.  BUN 12,  creatinine 0.8.  Hemoglobin 19, hematocrit 56.  Platelet count 340,000.  MCV 94.4.  White blood cell count 14.6.   Imaging study:   Plain abdominal films confirmed thickening of the left  colon also noted on the CT scan.  CT scan shows thickened hepatic  flexure and left colon suggesting colitis.  Diverticulitis not  suggested.  Also incidentally noted a moderately large sliding hiatal  hernia.  Two-view chest films show pulmonary hyperaeration raising  question of COPD as well as a hiatal hernia.   PHYSICAL EXAMINATION:  Blood pressure 193/93.  Pulse 71, respirations  20, room air saturation 94%, and temperature 97.6.  The patient is a pleasant, thin, elderly white female who is pleasant  and appears comfortable.  She is appropriate.  HEENT:  Sclerae are nonicteric.  Conjunctivae are pink.  Extraocular  movements are intact.  Facial skin with notable spider vascular  formations.  Oropharynx:  She is wearing upper and lower dentures.  The  mucosa is moist and clear.  NECK:  There is no JVD, no masses, no thyromegaly.  CHEST: Clear to auscultation and percussion bilaterally, though the  breath sounds are diminished.  There is no cough.  There is no dyspnea  with speech.  CARDIOVASCULAR:  Regular rate and rhythm.  No murmurs,  rubs, or gallops appreciated.  PMI not displaced.  GASTROINTESTINAL:  Abdomen is soft, nondistended.  Spleen nonpalpable.  Liver edge smooth at about 2 cm below the costovertebral margin.  There  is a well-healed surgical scar in the right lower quadrant.  Abdomen is  markedly tender in the left lower quadrant but there is no guarding or  rebound with this.  RECTAL:  Exam notable for no palpable masses, but maroon, obviously  bloody stool which tests fecal occult blood positive.  EXTREMITIES:  No cyanosis, clubbing or edema.  DERMATOLOGIC:  There is some palmar erythema present.  NEUROLOGIC:  The patient is alert and oriented x3.  There is no  asterixis or tremor.  The patient moves all four limbs easily and stands  without assistance. PSYCHIATRIC:  The patient is cooperative, pleasant, and  appropriate.   IMPRESSION:  1. Acute colitis, likely ischemic.  Doubt diverticulitis or colon      cancer.  Infectious colitis is also a possible  etiology, but is      less likely than ischemic origin.  2. Chronic tobacco habituation.  3. Polycythemia in a smoker.  4. Somewhat diminished oxygen saturation.  She probably has an element      of COPD but does not appear to be particularly symptomatic from      this.  5. Positive family history in both parents of colon cancer.  Her last      screening colonoscopy was many decades ago.  6. Significant alcohol intake on a regular basis with palpable liver      edge.  There is no hepatomegaly or parenchymal changes to the liver      seen at CT scan today.  No LFTs have yet been performed.  However,      she does not appear to have any symptomatology or strong evidence      for chronic liver disease.   PLAN:  1. The patient is being admitted for supportive care which includes      clear liquid diet, IV fluids, empiric antibiotics with Unasyn,      serial hemoglobin and hematocrit.  Alcohol withdrawal protocol      initiated with Ativan on an as-needed basis should she develop any      alcohol withdrawal symptoms.  Pain management with Vicodin and we      can go to IV narcotics if necessary.  2. The patient should undergo screening colonoscopy in several weeks      if she does not have this done here while she is hospitalized since      she does have a strong family history of colon cancer.  3. The patient also needs to get established with a primary care      physician in the [facility] area for general medical preventative      care.  Apparently her family has suggested this to her before but      she has chosen not to find a local M.D. because she feels that she      is quite healthy and does not need a doctor.      Jennye Moccasin, PA-C      Hedwig Morton. Juanda Chance, MD  Electronically Signed    SG/MEDQ  D:  05/05/2007  T:  05/05/2007   Job:  [id number]

## 2011-03-28 NOTE — Discharge Summary (Signed)
NAME:  [name], [name]                   ACCOUNT NO.:  [id number]   MEDICAL RECORD NO.:  [id number]          PATIENT TYPE:  INP   LOCATION:  2015                         FACILITY:  [facility]   PHYSICIAN:  Evelene Croon, M.D.     DATE OF BIRTH:  [birth year]   DATE OF ADMISSION:  11/25/2008  DATE OF DISCHARGE:                               DISCHARGE SUMMARY   PRIMARY ADMITTING DIAGNOSIS:  Chest pain.   ADDITIONAL/DISCHARGE DIAGNOSES:  1. Severe three-vessel coronary artery disease.  2. Non-ST segment elevation myocardial infarction.  3. History of prior percutaneous transluminal coronary angioplasty in      November 2009.  4. History of acute colitis in July 2008.  5. Chronic obstructive pulmonary disease.  6. History of polycythemia.  7. Hypertension.  8. History of ocular shingles.  9. Prior history of tobacco abuse, quit in November 2009.   PROCEDURES PERFORMED:  1. Cardiac catheterization.  2. Coronary artery bypass grafting x4 (left internal mammary artery to      the LAD, saphenous vein graft to the diagonal, saphenous vein graft      to the second obtuse marginal, saphenous vein graft to posterior      descending).  3. Endoscopic vein harvest, right leg.   HISTORY:  The patient is a 77 year old female with a history of coronary  artery disease.  She initially presented in November 2009 with a non-ST-  segment elevation myocardial infarction.  Catheterization at that time  showed severe three-vessel disease, and at that time she underwent a  PTCA of an occluded diagonal branch.  It was recommended that her  extensive disease be treated with coronary artery bypass surgery.  However, after consultation with Dr. Evelene Croon, the patient decided  she did not want to proceed with surgery at that time and continued  medical therapy.  She had done well until recently.  On the date of this  admission, she developed exertional chest pain which radiated into her  back and was associated with  shortness of breath, nausea, and  diaphoresis.  Her pain was unrelieved with nitroglycerin tablets, and  she subsequently presented to the emergency department for further  evaluation.  Because of her prior history of coronary artery disease and  her current symptoms, she was seen by Cardiology and was subsequently  admitted for further evaluation and treatment.   HOSPITAL COURSE:  [name] was admitted and subsequently ruled in for  non-ST-elevation myocardial infarction.  She underwent repeat cardiac  catheterization on November 26, 2008, which showed severe three-vessel  disease as noted before.  The LAD had an 80% proximal stenosis.  The  diagonal branch which had been opened with angioplasty before, had  restenosed to about 95%.  The circumflex had about an 80% proximal  stenosis.  Left ventricular ejection fraction was about 55%.  Once  again, Dr. Evelene Croon saw the patient and reviewed her films and again  recommended coronary artery bypass surgery.  He explained all risks,  benefits, and alternatives of surgery, and the patient did agree to  proceed at this time.  She remained stable and pain free on a heparin  drip prior to surgery.  She was taken to the operating room on November 27, 2008, and underwent CABG x4 as described in detail above, performed  by Dr. Laneta Simmers.  She tolerated the procedure well and was transferred to  the SICU in stable condition.  She was able to be extubated shortly  after surgery.  She was hemodynamically stable and doing well on postop  day 1.  She remained in the unit overnight for further observation, and  her chest tubes and hemodynamic monitoring devices were removed.  By  postop day 2, she was ready for transfer to the floor.  Her  postoperative course has overall been uneventful.  She has been started  on a beta-blocker and her dosage has been titrated upward.  She has also  been restarted on aspirin and a statin and will be restarted on Plavix   prior to discharge.  She has had small pleural effusions and evidence of  volume overload on physical exam and was started on Lasix to which she  is responding well.  She remains approximately 3-4 kg above her  preoperative weight, but is diuresing well.  She has been afebrile, and  her vital signs have been stable.  She is ambulating in the halls with  cardiac rehab phase I and is making good progress.  Her incisions are  healing well.  Her most recent labs show hemoglobin 16.4, hematocrit  30.8, platelets 174, white count 8.9, sodium 133, potassium 4.1, BUN 20,  creatinine 0.82.  It is felt that if she continues to remain stable over  the next 24 hours depending morning round evaluation on December 02, 2008, she will be ready for discharge home.   DISCHARGE MEDICATIONS:  1. Aspirin 81 mg daily.  2. Plavix 75 mg daily.  3. Coreg 6.25 mg b.i.d.  4. Lasix 40 mg daily x1 week.  5. Potassium 20 mEq daily x1 week.  6. Ultram 50-100 mg q.4 h. p.r.n. for pain.  7. Multivitamin daily.  8. Lipitor 80 mg daily.   DISCHARGE INSTRUCTIONS:  She is asked to refrain from driving, heavy  lifting, or strenuous activity.  She may continue ambulating daily and  using her incentive spirometer.  She may shower daily and clean her  incisions with soap and water.  She will continue a low-fat, low-sodium  diet.   DISCHARGE FOLLOWUP:  She will need to see Dr. Juanda Chance back in the office  in 2 weeks.  She will then follow up with Dr. Sharee Pimple PA in 3 weeks  with a chest x-ray.  In the interim if she experiences any problems or  has questions, she is asked to contact our office.      Coral Ceo, P.A.      Evelene Croon, M.D.  Electronically Signed    GC/MEDQ  D:  12/01/2008  T:  12/01/2008  Job:  [id number]   cc:   Everardo Beals. Juanda Chance, MD, [facility]

## 2011-03-28 NOTE — Discharge Summary (Signed)
NAME:  [name], [name]                   ACCOUNT NO.:  [id number]   MEDICAL RECORD NO.:  [id number]          PATIENT TYPE:  INP   LOCATION:  2037                         FACILITY:  [facility]   PHYSICIAN:  Jessica Fells. Excell Seltzer, [name]  DATE OF BIRTH:  1928-10-29   DATE OF ADMISSION:  09/27/2008  DATE OF DISCHARGE:  09/30/2008                               DISCHARGE SUMMARY   PRIMARY CARDIOLOGIST:  Jessica Beals. Juanda Chance, [name], [facility]   PRIMARY CARE [name]:  Jessica Baars, [name]   DISCHARGE DIAGNOSIS:  Acute anterolateral ST-segment elevation  myocardial infarction.   SECONDARY DIAGNOSES:  1. Coronary artery disease status post percutaneous transluminal      coronary angioplasty of the first diagonal on this admission.  2. Ongoing tobacco abuse.  3. Ethyl alcohol abuse.  4. Chronic obstructive pulmonary disease.  5. Hypertension.  6. Polycythemia.  7. History of ocular shingles.  8. History of acute colitis in July 2008.  9. Status post appendectomy.  10.Status post total abdominal hysterectomy.  11.Status post dilation and curettage.   ALLERGIES:  CODEINE which causes a rash.   PROCEDURES:  Left heart cardiac catheterization with successful  percutaneous transluminal coronary angioplasty of the first diagonal.   HISTORY OF PRESENT ILLNESS:  A 77 year old Caucasian female with the  above problem list.  She had no prior cardiac history.  She was in her  usual state of health until the evening of September 27, 2008, when she  developed sudden onset of substernal chest discomfort with radiation to  the right arm and back.  She was taken to [facility] ED where she  was noted to have ST-segment elevation in leads I, aVL, and V2 with ST-  segment depression in II, III, aVF, and V3-V6.  Code STEMI was  activated, and the patient was taken emergently to the cath lab.   HOSPITAL COURSE:  Left heart cardiac catheterization was performed  revealing significant multivessel disease.  The infarcted vessel  was  felt to be the first diagonal which was completely occluded.  The  diagonal was successfully balloon angioplastied.  EF was 40% with  anterior hypokinesis to akinesis.  Given her residual coronary artery  disease including 80-90% stenoses in the LAD, left circumflex, and right  coronary artery, it was felt that she may benefit from coronary artery  bypass grafting.  Thoracic Surgery was consulted, and the patient was  seen by [name] on September 28, 2008.  The patient expressed  him a desire to avoid bypass surgery and thus it was recommended that we  pursue medical therapy.  This situation was discussed with [name]  and we then discussed with the patient and family, and the patient has  been maintained on beta blocker, ACE inhibitor, aspirin, statin, and  Plavix therapy.  [name] has had no recurrent chest discomfort.  She  has seen by cardiac rehab and has been ambulating without symptoms or  limitations.  She will be discharged home today in good condition.   DISCHARGE LABORATORY:  Hemoglobin 11.7, hematocrit 33.2, WBC 8.7,  platelets 213, and MCV 94.4.  Sodium 139, potassium 3.7, chloride 106,  CO2 25, BUN 10, creatinine 0.72, and glucose 101.  Total bilirubin 0.6,  alkaline phosphatase 51, AST 114, ALT 23, and albumin 2.9.  Peak CK  1081, peak MB 109.5, peak troponin I 57.43.  Calcium 8.6.  TSH 5.301.   DISPOSITION:  The patient will be discharged home today in good  condition.   FOLLOWUP PLANS AND APPOINTMENTS:  We have asked her to follow up with  primary care [name] in the next 1-2 weeks.  She should have outpatient  thyroid function testing given a mildly elevated TSH in the hospital,  which was felt to be most likely related to a sick euthyroid in the  setting of MI.  We have arranged a followup with [name] on  October 22, 2008, at 3:15 p.m.   DISCHARGE MEDICATIONS:  1. Aspirin 325 mg daily.  2. Plavix 75 mg daily.  3. Coreg 6.25 mg  b.i.d.  4. Lisinopril 10 mg daily.  5. Lipitor 80 mg nightly.  6. Nitroglycerin 0.4 mg sublingual p.r.n. chest pain.   OUTSTANDING LABORATORY STUDIES:  The patient will require followup  lipids and LFTs in approximately 6-8 weeks, as well as thyroid function  testing as above.   DURATION OF DISCHARGE ENCOUNTER:  Sixty minutes including physician  time.      [name], ANP      Jessica Fells. Excell Seltzer, [name]  Electronically Signed    CB/MEDQ  D:  09/30/2008  T:  09/30/2008  Job:  [id number]   cc:   [name], M.D.

## 2011-03-28 NOTE — Assessment & Plan Note (Signed)
OFFICE VISIT   [name], [name]  DOB:  June 07, 1928                                        December 21, 2008  CHART #:  [id number]   The patient comes in today for a 3-week postoperative followup.  She is  status post coronary artery bypass grafting x4 (LIMA to the LAD,  saphenous vein to the diagonal, saphenous vein to the second obtuse  marginal, saphenous vein to the posterior descending) on 11/27/2008.  She did well postoperatively and was able to be discharged home on  12/02/2008 without problems.  She has done well following discharge  until approximately 1 week ago.  At that point, she developed some  nausea, some shortness of breath, and related to strange odor and  related to taking Plavix.  She was seen in the [facility] Office  on 12/15/2008, by Dr. Myrtis Ser.  She was noted to be hypertensive and was  started on amlodipine 5 mg daily in addition to her Coreg.  Also, she  was noted to have slight increase in her left pleural effusion on chest  x-ray and was restarted on Lasix.  She had previously taken 1 week at  home following discharge.  Also, it was determined that she had not had  a prior stent placed at the time of her PTCA that she would not require  further Plavix therapy.  Her Plavix was discontinued and she states that  her symptoms resolved following the discontinuation of the Plavix.  She  did return for follow up on December 17, 2008, with Dr. Juanda Chance and was  much improved at that time.  Since then, she reports an improvement in  her appetite and no further nausea.  She has continued to have some  shortness of breath with exertion, but denies any shortness of breath at  rest, orthopnea, or PND.  She is having no chest discomfort and only  minimal lower extremity edema mostly in her right leg from which her  saphenous vein was harvested.  She is still staying with her  granddaughter, but feels that she is ready to return to her home.  She  is  otherwise progressing well.   PHYSICAL EXAMINATION:  VITAL SIGNS:  Blood pressure is 146/80, pulse is  76 and regular, respirations 18 and unlabored, and O2 sat 91% on room  air.  CHEST:  Her sternal chest tube and right lower extremity EVH incisions  are all well healed without erythema or drainage.  Her sternum is stable  to palpation.  HEART:  Regular rate and rhythm without murmurs, rubs, or gallops.  LUNGS:  Clear, although breath sounds in the left base are slightly  diminished.  EXTREMITIES:  Only trace edema in the right lower extremity with no  significant edema on the left.   Chest x-ray on 12/21/2008 shows a slightly increased left pleural  effusion compared to her post discharge x-ray on 12/02/2008.  Her  previous x-ray from December 15, 2008, is not available for comparison.   ASSESSMENT AND PLAN:  Overall, the patient is doing well status post  coronary artery bypass graft x4.  Upon further questioning of the  patient and discussion with her granddaughter, she filled the  prescription for Plavix that Dr. Myrtis Ser had given her, however, she has  not been taking it.  I do believe that  this would help with her effusion  and I have recommended that she restart that as well as potassium 20 mEq  daily for which she also has a prescription.  I would like for her to  follow up again in 2 weeks with a chest x-ray when Dr. Laneta Simmers is in the  office for a recheck.  Hopefully, we can avoid thoracentesis in the  future.  She also has a scheduled appointment to see Dr. Juanda Chance back in  the next few weeks to recheck on her blood pressure, which she states is  under much better control at this point.  I have encouraged her to keep  that appointment and to call in the interim if she experiences any  problems.  Her family is available to a sister at her home and I feel  that she is okay to return to her home at this point with assistance.  We will have her scheduled to return to the office for  followup or call  in the interim if she experiences any problems or has questions.   [name], M.D.  Electronically Signed   GC/MEDQ  D:  12/21/2008  T:  12/21/2008  Job:  [id number]   cc:   Everardo Beals. Juanda Chance, MD, [facility]  [facility] office

## 2011-03-31 NOTE — Discharge Summary (Signed)
NAME:  [name], [name]                   ACCOUNT NO.:  [id number]   MEDICAL RECORD NO.:  [id number]          PATIENT TYPE:  INP   LOCATION:  6709                         FACILITY:  [facility]   PHYSICIAN:  Rachael Fee, MD   DATE OF BIRTH:  1928-01-17   DATE OF ADMISSION:  05/03/2007  DATE OF DISCHARGE:  05/06/2007                               DISCHARGE SUMMARY   ADMITTING DIAGNOSES:  1. Acute colitis, likely ischemic, possibly infectious.  2. Chronic tobacco habituation.  3. Polycythemia.  4. Slight hypoxemia, probably has chronic obstructive pulmonary      disease.  5. Positive family history of colon cancer in both parents.  Remote      out-of-state screening colonoscopy negative.  6. Significant alcohol intake.  No hepatomegaly or liver changes seen      on CT scan.   DISCHARGE DIAGNOSIS:  1. Acute left-sided colitis with bloody diarrhea, question infectious      versus ischemic.  2. Polycythemia, resolved.  3. Mild hyperglycemia.  4. Possible chronic obstructive pulmonary disease in a chronic smoker.  5. Incidental hiatal hernia noted on chest CT.  Patient asymptomatic      from the point of view of reflux symptoms.   PROCEDURES:  None.   CONSULTATIONS:  None.   BRIEF HISTORY:  [name] is a 77 year old woman.  She does not have a  primary care doctor and has not seen a physician in decades.  She has a  couple of cocktails every day and does smoke a pack of cigarettes a day  and has been smoking since she was 15.  The patient presented to the  emergency room with left lower quadrant abdominal pain, diarrhea and  eventually bloody diarrhea.  Twenty-four hours later, she presented to  the emergency room and was admitted by Dr. Lina Sar to the hospital.  Her white blood cell count was elevated, and she was afebrile.  A CT  scan showed thickening in the left colon consistent with infectious  versus ischemic colitis.   LABORATORY:  Initial hemoglobin 17.4, 12.7 at discharge.   Hematocrit  51.3, 37.6 at discharge.  MCV 94.2.  White blood cell count went from  14.6 down to 8.4.  Platelets 340,000.  Differential did reveal left  shift with elevated neutrophils at 90% and diminished lymphocytes at 7%.  PT 13.5, INR 1.0.  Sodium 138, potassium 3.8, chloride 107.  Glucose  128, BUN 12, creatinine 0.72.  Total bilirubin 0.6, alkaline phosphatase  57, AST 14, ALT 12.  Albumin 2.6.  TSH 5.453. Iron 47, total iron  binding capacity 290, iron saturation 16%, B12 741, folate 5.6.  Urinalysis negative. C. difficile, Salmonella, Shigella, Campylobacter  and Yersinia cultures negative.  EHEC toxin negative for Shigatoxin 1&2.  Giardia, crypto screening negative.   Two-view chest showing pulmonary hyper a aeration raising question of  COPD.  Hiatal hernia noted.   CT abdomen, pelvis showed thickening of the left and sigmoid colon.  No  diverticula noted.  Findings consistent with colitis, either infectious  source edema.   Two-view abdomen  showed thickening of the left colon, most likely due to  colitis.   HOSPITAL COURSE:  #1.  ACUTE LEFT-SIDED COLITIS WITH BLOODY DIARRHEA:  The patient was  admitted to the hospital, and diet was limited to clears.  She was  supported with IV fluids and pain and nausea medications as needed.  She  continued to have stools with small amount of blood, crampy abdominal  pain.  This lasted for the 3 days of her hospitalization.  Eventually  the abdominal pain resolved. Diet was advanced to a low-residue diet  which she tolerated.  The patient was afebrile during the course of  hospitalization.  Though it was most likely the patient had ischemic  colitis, she was treated with Unasyn during the course of  hospitalization but did not continue antibiotics at discharge.   #2.  CHRONIC TOBACCO HABITUATION:  The patient has little interest in  stopping smoking.  She was not provided with any nicotine replacement  therapy.   #3.  MILD  ELEVATION OF BLOOD GLUCOSE:  She did not require support with  sliding scale insulin.  Initial glucose was 128, and on recheck it was  107.  The patient should find a primary care doctor and have periodic  followup of her blood glucose to assure that this is not continuing to  be elevated and require medical therapy.   #4.  POLYCYTHEMIA:  Some of the elevation of her hemoglobin and  hematocrit may have a represented an element of dehydration as well as  status of chronic tobacco use.  In any event, this normalized with IV  fluids.   DISCHARGE DIET:  Low-residue.   </   DISCHARGE MEDICATIONS:  None.   Followup office visit with Dr. Lina Sar on August 11 at 10:00 a.m.      Jennye Moccasin, PA-C      Rachael Fee, MD  Electronically Signed    SG/MEDQ  D:  06/05/2007  T:  06/05/2007  Job:  [id number]   cc:   Hedwig Morton. Juanda Chance, MD

## 2011-07-31 ENCOUNTER — Emergency Department (HOSPITAL_COMMUNITY): Payer: Medicare Other

## 2011-07-31 ENCOUNTER — Inpatient Hospital Stay (HOSPITAL_COMMUNITY)
Admission: EM | Admit: 2011-07-31 | Discharge: 2011-08-01 | DRG: 313 | Disposition: A | Discharge status: Home / Self Care | Payer: Medicare Other | Attending: Cardiovascular Disease | Admitting: Cardiovascular Disease

## 2011-07-31 DIAGNOSIS — Z951 Presence of aortocoronary bypass graft: Secondary | ICD-10-CM

## 2011-07-31 DIAGNOSIS — I252 Old myocardial infarction: Secondary | ICD-10-CM

## 2011-07-31 DIAGNOSIS — Z79899 Other long term (current) drug therapy: Secondary | ICD-10-CM

## 2011-07-31 DIAGNOSIS — F1010 Alcohol abuse, uncomplicated: Secondary | ICD-10-CM | POA: Diagnosis present

## 2011-07-31 DIAGNOSIS — I10 Essential (primary) hypertension: Secondary | ICD-10-CM | POA: Diagnosis present

## 2011-07-31 DIAGNOSIS — I7300 Raynaud's syndrome without gangrene: Secondary | ICD-10-CM | POA: Diagnosis present

## 2011-07-31 DIAGNOSIS — Z9861 Coronary angioplasty status: Secondary | ICD-10-CM

## 2011-07-31 DIAGNOSIS — D509 Iron deficiency anemia, unspecified: Secondary | ICD-10-CM | POA: Diagnosis present

## 2011-07-31 DIAGNOSIS — J449 Chronic obstructive pulmonary disease, unspecified: Secondary | ICD-10-CM | POA: Diagnosis present

## 2011-07-31 DIAGNOSIS — E785 Hyperlipidemia, unspecified: Secondary | ICD-10-CM | POA: Diagnosis present

## 2011-07-31 DIAGNOSIS — Z87891 Personal history of nicotine dependence: Secondary | ICD-10-CM

## 2011-07-31 DIAGNOSIS — J4489 Other specified chronic obstructive pulmonary disease: Secondary | ICD-10-CM | POA: Diagnosis present

## 2011-07-31 DIAGNOSIS — I2510 Atherosclerotic heart disease of native coronary artery without angina pectoris: Secondary | ICD-10-CM | POA: Diagnosis present

## 2011-07-31 DIAGNOSIS — Z7982 Long term (current) use of aspirin: Secondary | ICD-10-CM

## 2011-07-31 DIAGNOSIS — I4891 Unspecified atrial fibrillation: Secondary | ICD-10-CM | POA: Diagnosis present

## 2011-07-31 DIAGNOSIS — Z7901 Long term (current) use of anticoagulants: Secondary | ICD-10-CM

## 2011-07-31 DIAGNOSIS — D45 Polycythemia vera: Secondary | ICD-10-CM | POA: Diagnosis present

## 2011-07-31 DIAGNOSIS — I739 Peripheral vascular disease, unspecified: Secondary | ICD-10-CM | POA: Diagnosis present

## 2011-07-31 DIAGNOSIS — R0789 Other chest pain: Principal | ICD-10-CM | POA: Diagnosis present

## 2011-07-31 LAB — COMPREHENSIVE METABOLIC PANEL
ALT: 15 U/L (ref 0–35)
AST: 16 U/L (ref 0–37)
Alkaline Phosphatase: 91 U/L (ref 39–117)
CO2: 26 mEq/L (ref 19–32)
Chloride: 102 mEq/L (ref 96–112)
Creatinine, Ser: 0.73 mg/dL (ref 0.50–1.10)
GFR calc non Af Amer: >60 mL/min (ref >60)
Potassium: 3.4 mEq/L — ABNORMAL LOW (ref 3.5–5.1)
Sodium: 140 mEq/L (ref 135–145)
Total Bilirubin: 0.3 mg/dL (ref 0.3–1.2)

## 2011-07-31 LAB — DIFFERENTIAL
Eosinophils Absolute: 0 10*3/uL (ref 0.0–0.7)
Lymphs Abs: 1.3 10*3/uL (ref 0.7–4.0)
Monocytes Relative: 5 % (ref 3–12)
Neutrophils Relative %: 75 % (ref 43–77)

## 2011-07-31 LAB — PROTIME-INR: INR: 1.77 — ABNORMAL HIGH (ref 0.00–1.49)

## 2011-07-31 LAB — CBC
MCH: 31.1 pg (ref 26.0–34.0)
MCV: 89 fL (ref 78.0–100.0)
Platelets: 331 10*3/uL (ref 150–400)
RBC: 4.56 MIL/uL (ref 3.87–5.11)

## 2011-08-01 ENCOUNTER — Telehealth: Payer: Self-pay | Admitting: *Deleted

## 2011-08-01 ENCOUNTER — Encounter (INDEPENDENT_AMBULATORY_CARE_PROVIDER_SITE_OTHER): Payer: Medicare Other

## 2011-08-01 DIAGNOSIS — I4891 Unspecified atrial fibrillation: Secondary | ICD-10-CM

## 2011-08-01 DIAGNOSIS — R079 Chest pain, unspecified: Secondary | ICD-10-CM

## 2011-08-01 LAB — CARDIAC PANEL(CRET KIN+CKTOT+MB+TROPI)
CK, MB: 3.3 ng/mL (ref 0.3–4.0)
Relative Index: INVALID (ref 0.0–2.5)
Total CK: 80 U/L (ref 7–177)

## 2011-08-01 LAB — BASIC METABOLIC PANEL
CO2: 26 mEq/L (ref 19–32)
Chloride: 103 mEq/L (ref 96–112)
GFR calc Af Amer: >60 mL/min (ref >60)
Potassium: 3.7 mEq/L (ref 3.5–5.1)
Sodium: 140 mEq/L (ref 135–145)

## 2011-08-01 LAB — TSH: TSH: 6.921 u[IU]/mL — ABNORMAL HIGH (ref 0.350–4.500)

## 2011-08-01 LAB — PROTIME-INR: INR: 1.88 — ABNORMAL HIGH (ref 0.00–1.49)

## 2011-08-01 NOTE — Telephone Encounter (Signed)
Per Dr. Antoine Poche pt will need 21 day event monitor.  Pt to be discharged from hospital today.  Appt made for event monitor to be put on today at 3:30. Lorenda Ishihara, PA aware and will let pt know of appt time today when going over discharge instructions.

## 2011-08-15 LAB — CBC
Hemoglobin: 12.2
MCHC: 33.6
MCHC: 34.4
MCV: 94.4
RBC: 3.52 — ABNORMAL LOW
RBC: 3.75 — ABNORMAL LOW
RBC: 4.87
RDW: 13.9
WBC: 15 — ABNORMAL HIGH
WBC: 8.7
WBC: 9.4

## 2011-08-15 LAB — BASIC METABOLIC PANEL
Calcium: 9.5
Chloride: 106
Creatinine, Ser: 0.72
Creatinine, Ser: 0.81
GFR calc Af Amer: >60
GFR calc Af Amer: >60
Potassium: 3.7
Sodium: 139

## 2011-08-15 LAB — LIPID PANEL
Cholesterol: 261 — ABNORMAL HIGH
LDL Cholesterol: 129 — ABNORMAL HIGH
Triglycerides: 198 — ABNORMAL HIGH
VLDL: 40

## 2011-08-15 LAB — CARDIAC PANEL(CRET KIN+CKTOT+MB+TROPI)
Relative Index: 6.5 — ABNORMAL HIGH
Total CK: 1046 — ABNORMAL HIGH
Troponin I: 18.86
Troponin I: 30.65
Troponin I: 57.43

## 2011-08-15 LAB — DIFFERENTIAL
Basophils Relative: 1
Lymphs Abs: 1.4
Monocytes Relative: 4
Neutro Abs: 12.9 — ABNORMAL HIGH
Neutrophils Relative %: 86 — ABNORMAL HIGH

## 2011-08-15 LAB — COMPREHENSIVE METABOLIC PANEL
ALT: 23
AST: 114 — ABNORMAL HIGH
Albumin: 2.9 — ABNORMAL LOW
Calcium: 8.1 — ABNORMAL LOW
GFR calc Af Amer: >60
Sodium: 137
Total Protein: 5 — ABNORMAL LOW

## 2011-08-15 LAB — URINALYSIS, ROUTINE W REFLEX MICROSCOPIC
Glucose, UA: NEGATIVE
Ketones, ur: NEGATIVE
pH: 6

## 2011-08-15 LAB — APTT: aPTT: 21 — ABNORMAL LOW

## 2011-08-15 LAB — URINE MICROSCOPIC-ADD ON

## 2011-08-15 LAB — TSH: TSH: 5.301 — ABNORMAL HIGH

## 2011-08-21 ENCOUNTER — Encounter: Payer: Medicare Other | Admitting: Cardiovascular Disease

## 2011-08-24 NOTE — Discharge Summary (Signed)
NAME:  [name], DIAS                   ACCOUNT NO.:  [id number]  MEDICAL RECORD NO.:  [id number]  LOCATION:  4707                         FACILITY:  [facility]  PHYSICIAN:  Rollene Rotunda, MD, FACCDATE OF BIRTH:  [birth year]  DATE OF ADMISSION:  07/31/2011 DATE OF DISCHARGE:  08/01/2011                              DISCHARGE SUMMARY   PROCEDURES:  Two-view chest x-ray  PRIMARY FINAL DISCHARGE DIAGNOSIS:  Chest pain, medical therapy for coronary artery disease recommended.  SECONDARY DIAGNOSES: 1. Status post ST-elevation myocardial infarction in November 2009,     with percutaneous transluminal coronary angioplasty. 2. History of tobacco use. 3. History of EtOH abuse. 4. Chronic obstructive pulmonary disease. 5. Hypertension. 6. Polycythemia. 7. History of ocular shingles. 8. History of colitis. 9. Status post appendectomy and hysterectomy, and dilation and     curettage. 10.Status post non-ST segment elevation myocardial infarction in     January 2010, with bypass surgery, left internal mammary artery to     left anterior descending, saphenous vein graft to diagonal,     saphenous vein graft to obtuse marginal 2, saphenous vein graft to     posterior descending artery. 11.Preserved left ventricular function with an ejection fraction of     55% in January prior to bypass surgery. 12.Allergy or intolerance to codeine, Tylenol, Plavix, and statin. 13.History of postherpetic neuralgia. 14.Iron-deficiency anemia. 15.History of peripheral vascular disease. 16.History of Raynaud syndrome. 17.PAF.  TIME AT DISCHARGE:  34 minutes.  HOSPITAL COURSE:  [name] is an 77 year old female with a history of coronary artery disease.  She had chest discomfort and came to the hospital where she was admitted for further evaluation and treatment.  She also describes palpitations which were symptomatic with a lightheaded feeling.  Her cardiac enzymes were cycled and were negative for MI.  She is  on Coumadin for paroxysmal atrial fibrillation and her INR was slightly subtherapeutic at 1.77 and was 1.8 at discharge.  Her TSH was minimally elevated at 6.921.  Her EKG had no acute ST or T-wave changes.  On August 01, 2011, [name] was seen by Dr. Antoine Poche and considered her stable for discharge, to follow up as an outpatient.  DISCHARGE INSTRUCTIONS:  She is encouraged to stick to a low-sodium heart-healthy diet.  She is to increase her activity gradually.  She is to pick up her event monitor today at 3:30 p.m.  She is to follow up with Dr. Clifton Elchonon Maxson on September 04, 2011, at noon and with Dr. Clelia Croft as needed.  She is to get a Coumadin check next week.  DISCHARGE MEDICATIONS: 1. Coumadin 2.5 mg 1-1/2 tablets on Tuesday, Thursday, Saturday and     Sunday, 1 tablet on other days. 2. Fish oil 1200 mg daily. 3. Colace 100 mg daily p.r.n. 4. Amlodipine 5 mg 1-1/2 tablets daily. 5. Lasix 40 mg daily p.r.n. 6. Sublingual nitroglycerin p.r.n. 7. Imdur 30 mg 1-1/2 tablet daily. 8. Aspirin 81 mg a day. 9. Coenzyme Q10 100 mg daily.     Theodore Demark, PA-C   ______________________________ Rollene Rotunda, MD, [facility]    RB/MEDQ  D:  08/01/2011  T:  08/01/2011  Job:  [id number]  cc:   Kari Baars, M.D.  Electronically Signed by Theodore Demark PA-C on 08/21/2011 06:42:48 AM Electronically Signed by Rollene Rotunda MD [facility] on 08/24/2011 01:37:03 PM

## 2011-08-24 NOTE — H&P (Signed)
NAMEGOLDA, [name] NO.:  [id number]  MEDICAL RECORD NO.:  [id number]  LOCATION:  4707                         FACILITY:  [facility]  PHYSICIAN:  Rollene Rotunda, MD, FACCDATE OF BIRTH:  08-Jul-1928  DATE OF ADMISSION:  07/31/2011 DATE OF DISCHARGE:                             HISTORY & PHYSICAL   PRIMARY CARE PHYSICIAN:  Kari Baars, MD.  CARDIOLOGIST:  Verne Carrow, MD  REASON FOR PRESENTATION:  Evaluate patient with chest discomfort.  HISTORY OF PRESENT ILLNESS:  The patient is a lovely 77 year old white female with past history of coronary disease.  She has had PCI as well as CABG.  She has had some recurrent chest discomfort.  She has been admitted couple of times since bypass with chest discomfort.  She says this presentation is similar.  She describes discomfort that happens sporadically.  She said that for the last 3-4 days, she did not feel well.  This was somewhat vague.  She was having some episodic shortness of breath.  However, Saturday, she developed some chest tightness.  She had some band-like left arm discomfort.  She developed some pain across her back.  She described this as a squeezing sensation.  This would happen independently with shortness of breath.  She would have episodes of clamminess.  She also gets some GI complaints and feeling like when this happens, she has to have a bowel movement and urinate.  She episodically has to sleep in a chair, but this has not been new.  She has not really describing classic PND or orthopnea.  She is not sure whether she is having any palpitations with these events, though she feels palpitations independently.  She does get lightheaded, but she had no syncope.  She has been able to do her usual household activities and does climb stairs without being able to bring on any of this.  The pain was most prominent on Saturday, less so yesterday, but came back today, and so she presented to the  emergency room.  Of note during these episodes, she has taken nitroglycerin.  Today, she took 2 nitroglycerin and 2 aspirin, and thought she did have improvement in her symptoms.  In the emergency room, she was not found to have any acute ST-segment changes on her EKG.  First troponin was negative.  PAST MEDICAL HISTORY:  COPD, CAD, PVD (ABI 0.61 on the right and 0.78 on the left), hypertension, hyperlipidemia, paroxysmal atrial fibrillation, Raynaud's, zoster, ischemic colitis, left carotid artery stenosis 40- 59%.  PAST SURGICAL HISTORY:  CABG (LIMA to the LAD, SVG to diagonal, SVG to OM2, SVG to PDA), appendectomy, hysterectomy, knee surgery.  ALLERGIES/INTOLERANCES:  None.  MEDICATIONS:  Colace, Coumadin, Lasix 40 mg daily, nitroglycerin sublingual, fish oil, Coenzyme-Q, aspirin 81 mg daily, amlodipine.  SOCIAL HISTORY:  The patient is widowed.  She had about 100-pack-year smoking history, but quit in 2010.  She has one daughter and stepchildren.  FAMILY HISTORY:  Noncontributory for early coronary artery disease.  REVIEW OF SYSTEMS:  As stated in the HPI.  Negative for all other systems.  PHYSICAL EXAMINATION:  GENERAL:  The patient is pleasant and in no distress.  VITAL SIGNS:  Blood pressure 164/69, heart rate 74 and regular, respiratory rate 16, afebrile. HEENT:  Eyes are unremarkable.  Pupils equal, round, reactive to light. Fundi not visualized.  Oral mucosa unremarkable. NECK:  No jugular distention at 45 degrees, carotid upstroke brisk and symmetrical.  No bruits, no thyromegaly. LYMPHATICS:  No cervical, axillary, or inguinal adenopathy. LUNGS:  Clear to auscultation bilaterally. BACK:  No costovertebral angle tenderness. CHEST:  Well-healed sternotomy scar, tenderness to palpation in multiple areas of her chest. HEART:  PMI not displaced or sustained.  S1 and S2 within normal limits. No S3, S4.  No clicks, rubs or murmurs. ABDOMEN:  Flat, positive bowel sounds,  normal in frequency and pitch, no bruits, rebound, guarding or midline pulsatile mass.  No hepatomegaly, no splenomegaly. SKIN:  No rashes, nodules. EXTREMITIES:  2+ upper pulses.  Palpable femorals.  Absent popliteal, dorsalis pedis and post tibialis on the right.  Diminished dorsalis pedis and post tibialis on the left.  No edema, no cyanosis or clubbing. NEURO:  Oriented to person, place, time.  Cranial nerves II through XII grossly intact, motor grossly intact.  EKG:  Sinus rhythm, rate 86, axis within normal limits, intervals within normal limits, poor anterior R-wave progression, probable anteroseptal infarct, old, no acute ST-T wave changes.  LABORATORY DATA:  Sodium 140, potassium 3.4, BUN 17, creatinine 0.73. WBC 6.8, hemoglobin 14.2, platelets 331.  PT 1.77.  ASSESSMENT/PLAN: 1. Chest discomfort.  Chest discomfort is somewhat atypical, though     there are some typical features.  She has refused stress testing in     the past and still does not want this.  She would want conservative     management.  I will cycle cardiac enzymes and repeat an EKG.  I     will start Imdur to treat possible angina.  Some of her complaint     is GI in nature and wonder if this could be related to intestinal     ischemia, but I will defer to Dr. Clifton Kortney Schoenfelder. 2. Atrial fibrillation.  There is a small possibility that her     symptoms could be related to this rhythm.  I would suggest an     outpatient event monitor for 2 weeks and she would agree to this.     I do note that her Coumadin level is subtherapeutic.  I have asked     pharmacy to dose her with extra mg this evening.  The leery in this     very small elderly woman of treating her currently with Lovenox,     and so we will check an INR in the morning after the higher dose     tonight. 3. Hypertension.  Blood pressure is elevated.  I will start the Imdur.     I do not know if she has been on a beta     blocker in the past which might be  added as well.  We are going to     look at blood pressures and heart rates overnight to determine     whether she would tolerate this. 4. Dyslipidemia.  I do not see a statin, but will defer to Dr.     Clifton Urania Pearlman.     Rollene Rotunda, MD, [facility]     JH/MEDQ  D:  07/31/2011  T:  08/01/2011  Job:  [id number]  cc:   Kari Baars, M.D.  Electronically Signed by Rollene Rotunda MD [facility] on 08/24/2011 01:36:59 PM

## 2011-08-30 LAB — URINALYSIS, ROUTINE W REFLEX MICROSCOPIC
Ketones, ur: 15 — AB
Nitrite: NEGATIVE
pH: 5.5

## 2011-08-30 LAB — CBC
HCT: 37.3
HCT: 37.6
Hemoglobin: 17.4 — ABNORMAL HIGH
MCV: 94.2
MCV: 95.2
Platelets: 215
Platelets: 340
RBC: 3.96
RBC: 4.43
RDW: 13.8
RDW: 13.9
WBC: 12.4 — ABNORMAL HIGH
WBC: 13.4 — ABNORMAL HIGH
WBC: 8.4

## 2011-08-30 LAB — COMPREHENSIVE METABOLIC PANEL
Albumin: 2.6 — ABNORMAL LOW
BUN: 3 — ABNORMAL LOW
Chloride: 111
Creatinine, Ser: 0.72
Total Bilirubin: 0.6

## 2011-08-30 LAB — HEMOGLOBIN AND HEMATOCRIT, BLOOD
HCT: 44.3
Hemoglobin: 15.2 — ABNORMAL HIGH

## 2011-08-30 LAB — DIFFERENTIAL
Basophils Absolute: 0.1
Lymphocytes Relative: 7 — ABNORMAL LOW
Lymphs Abs: 1.5
Monocytes Relative: 7
Neutro Abs: 11 — ABNORMAL HIGH
Neutro Abs: 13.1 — ABNORMAL HIGH
Neutrophils Relative %: 82 — ABNORMAL HIGH
Neutrophils Relative %: 90 — ABNORMAL HIGH

## 2011-08-30 LAB — STOOL CULTURE

## 2011-08-30 LAB — EHEC TOXIN BY EIA, STOOL

## 2011-08-30 LAB — IRON AND TIBC
Saturation Ratios: 16 — ABNORMAL LOW
TIBC: 290
UIBC: 243

## 2011-08-30 LAB — I-STAT 8, (EC8 V) (CONVERTED LAB)
Bicarbonate: 26.4 — ABNORMAL HIGH
HCT: 56 — ABNORMAL HIGH
Hemoglobin: 19 — ABNORMAL HIGH
Operator id: 189501
Sodium: 138
TCO2: 28

## 2011-08-30 LAB — URINE MICROSCOPIC-ADD ON

## 2011-08-30 LAB — FOLATE: Folate: 5.6

## 2011-08-30 LAB — CLOSTRIDIUM DIFFICILE EIA

## 2011-08-30 LAB — PROTIME-INR: Prothrombin Time: 13.5

## 2011-08-30 LAB — POCT I-STAT CREATININE
Creatinine, Ser: 0.8
Operator id: 189501

## 2011-09-04 ENCOUNTER — Ambulatory Visit (INDEPENDENT_AMBULATORY_CARE_PROVIDER_SITE_OTHER): Payer: Medicare Other | Admitting: Cardiovascular Disease

## 2011-09-04 ENCOUNTER — Encounter: Payer: Self-pay | Admitting: Cardiovascular Disease

## 2011-09-04 VITALS — BP 163/73 | HR 81 | Resp 18 | Ht 61.0 in | Wt 104.4 lb

## 2011-09-04 DIAGNOSIS — I2510 Atherosclerotic heart disease of native coronary artery without angina pectoris: Secondary | ICD-10-CM

## 2011-09-04 DIAGNOSIS — I739 Peripheral vascular disease, unspecified: Secondary | ICD-10-CM

## 2011-09-04 NOTE — Assessment & Plan Note (Signed)
Stable No changes 

## 2011-09-04 NOTE — Progress Notes (Signed)
History of Present Illness:77 yo WF with history of PAD, CAD s/p CABG 1/10, HTN, hyperlipidemia, former tobacco abuse and COPD who is here today for PV and cardiac follow up. I initially saw her in June of 2010 for PV workup. Her cardiac issues have been followed by Dr. Juanda Chance in the past. At the first visit, she was complaining of right leg cramping with long periods of walking but had no claudication with walking short distances. We elected to manage her conservatively with a walking program. She has done well with conservative management of her PAD. She has had no cramping in her legs with walking. She has no rest pain, numbness or ulcerations in the right leg or foot. Her bilateral lower ext varicosities are unchanged. She has not tolerated statins in the past. Non-invasive studies April 2012 with ABI of 0.61 on the right and 0.78 on the left. Carotid artery dopplers are stable with 0-39% RICA stenosis and 40-59% LICA stenosis.  She was admitted to [facility] in September 2012 with c/o chest pain. She ruled out for an MI and was discharged home. She wore a 21 day event monitor which showed normal sinus rhythm with no SVT, VT, atrial fibrillation. She has had no further chest pain. She has not had to use the NTG. She has been walking every day and feeling great. Her BP at home has been well controlled.   Past Medical History  Diagnosis Date  . COPD (chronic obstructive pulmonary disease)   . Former smoker   . SOB (shortness of breath)     uncertain etiology  . CAD (coronary artery disease)     status post previous anterior wall myocardial infarction, treated with PTCA dx and subsequent non-ST- elevation myocardial infarction with recent bypass surgery, November 29, 2008.   Marland Kitchen HTN (hypertension)   . Hyperlipidemia   . PVD (peripheral vascular disease)     with claudication  . Arrhythmia 02/2009    Paroxysmal Fibrillation  . Colitis, ischemic     Hx of  . Raynaud phenomenon   . Herpes zoster  ophthalmicus   . Postherpetic neuralgia   . Iron deficiency   . Colon neoplasm     Family history of    Past Surgical History  Procedure Date  . Coronary artery bypass graft     11/27/2008  . Appendectomy     Current Outpatient Prescriptions  Medication Sig Dispense Refill  . amLODipine (NORVASC) 5 MG tablet Take 1 tablet (5 mg total) by mouth 2 (two) times daily.  30 tablet  11  . aspirin 81 MG EC tablet Take 81 mg by mouth daily.        . Coenzyme Q10 100 MG capsule Take 100 mg by mouth daily.        Marland Kitchen docusate sodium (COLACE) 100 MG capsule Take 100 mg by mouth as needed.        . nitroGLYCERIN (NITROSTAT) 0.4 MG SL tablet Place 0.4 mg under the tongue as needed.        . Omega-3 Fatty Acids (FISH OIL) 1200 MG CAPS Take 1 capsule by mouth daily.        Marland Kitchen warfarin (COUMADIN) 2.5 MG tablet Use as directed by the Anticoagulation Clinic.         Allergies  Allergen Reactions  . Beta Adrenergic Blockers   . Cardizem (Diltiazem Hcl)   . Clopidogrel Bisulfate     REACTION: naseau  . Codeine   . Statins  History   Social History  . Marital Status: Widowed    Spouse Name: N/A    Number of Children: N/A  . Years of Education: N/A   Occupational History  . Homemaker    Social History Main Topics  . Smoking status: Former Games developer  . Smokeless tobacco: Not on file   Comment: Tobacco abuse for 65 years, one pack per day, stopped in January 2010.   Marland Kitchen Alcohol Use: Yes     2 alcoholic beverages per day.  . Drug Use: No  . Sexually Active: Not on file   Other Topics Concern  . Not on file   Social History Narrative  . No narrative on file    Family History  Problem Relation Age of Onset  . Colon cancer Father   . Colon cancer Mother     Review of Systems:  As stated in the HPI and otherwise negative.   BP 163/73  Pulse 81  Resp 18  Ht 5\' 1"  (1.549 m)  Wt 104 lb 6.4 oz (47.356 kg)  BMI 19.73 kg/m2  Physical Examination: General: Well developed, well  nourished, NAD HEENT: OP clear, mucus membranes moist SKIN: warm, dry. No rashes. Neuro: No focal deficits Musculoskeletal: Muscle strength 5/5 all ext Psychiatric: Mood and affect normal Neck: No JVD, no carotid bruits, no thyromegaly, no lymphadenopathy. Lungs:Clear bilaterally, no wheezes, rhonci, crackles Cardiovascular: Regular rate and rhythm. No murmurs, gallops or rubs. Abdomen:Soft. Bowel sounds present. Non-tender.  Extremities: No lower extremity edema. Pulses are 2 + in the bilateral DP/PT.  EKG:NSR, rate 65 bpm. Normal EKG  21 Day Event Monitor: 08/21/11: Normal rhythm, no SVT, atrial fib, VT.

## 2011-09-04 NOTE — Assessment & Plan Note (Addendum)
Stable. No changes. Repeat carotids and ABI in 6 months.

## 2011-09-04 NOTE — Patient Instructions (Signed)
Your physician wants you to follow-up in:6 months.  You will receive a reminder letter in the mail two months in advance. If you don't receive a letter, please call our office to schedule the follow-up appointment.  Your physician has requested that you have an ankle brachial index (ABI). During this test an ultrasound and blood pressure cuff are used to evaluate the arteries that supply the arms and legs with blood. Allow thirty minutes for this exam. There are no restrictions or special instructions. To be done in 6 months.    Your physician has requested that you have a carotid duplex. This test is an ultrasound of the carotid arteries in your neck. It looks at blood flow through these arteries that supply the brain with blood. Allow one hour for this exam. There are no restrictions or special instructions. To be done in 6 months.

## 2011-11-22 DIAGNOSIS — Z7901 Long term (current) use of anticoagulants: Secondary | ICD-10-CM | POA: Diagnosis not present

## 2011-11-22 DIAGNOSIS — I4891 Unspecified atrial fibrillation: Secondary | ICD-10-CM | POA: Diagnosis not present

## 2011-12-13 DIAGNOSIS — I4891 Unspecified atrial fibrillation: Secondary | ICD-10-CM | POA: Diagnosis not present

## 2011-12-13 DIAGNOSIS — Z7901 Long term (current) use of anticoagulants: Secondary | ICD-10-CM | POA: Diagnosis not present

## 2011-12-26 DIAGNOSIS — Z7901 Long term (current) use of anticoagulants: Secondary | ICD-10-CM | POA: Diagnosis not present

## 2011-12-26 DIAGNOSIS — I4891 Unspecified atrial fibrillation: Secondary | ICD-10-CM | POA: Diagnosis not present

## 2012-01-23 DIAGNOSIS — I4891 Unspecified atrial fibrillation: Secondary | ICD-10-CM | POA: Diagnosis not present

## 2012-01-23 DIAGNOSIS — Z7901 Long term (current) use of anticoagulants: Secondary | ICD-10-CM | POA: Diagnosis not present

## 2012-02-27 DIAGNOSIS — I4891 Unspecified atrial fibrillation: Secondary | ICD-10-CM | POA: Diagnosis not present

## 2012-02-27 DIAGNOSIS — Z7901 Long term (current) use of anticoagulants: Secondary | ICD-10-CM | POA: Diagnosis not present

## 2012-03-01 ENCOUNTER — Other Ambulatory Visit: Payer: Self-pay | Admitting: *Deleted

## 2012-03-01 DIAGNOSIS — I6529 Occlusion and stenosis of unspecified carotid artery: Secondary | ICD-10-CM

## 2012-03-04 ENCOUNTER — Encounter (INDEPENDENT_AMBULATORY_CARE_PROVIDER_SITE_OTHER): Payer: Medicare Other

## 2012-03-04 DIAGNOSIS — I739 Peripheral vascular disease, unspecified: Secondary | ICD-10-CM | POA: Diagnosis not present

## 2012-03-05 ENCOUNTER — Encounter (INDEPENDENT_AMBULATORY_CARE_PROVIDER_SITE_OTHER): Payer: Medicare Other

## 2012-03-05 ENCOUNTER — Ambulatory Visit (INDEPENDENT_AMBULATORY_CARE_PROVIDER_SITE_OTHER): Payer: Medicare Other | Admitting: Cardiovascular Disease

## 2012-03-05 ENCOUNTER — Ambulatory Visit: Payer: Medicare Other | Admitting: Cardiovascular Disease

## 2012-03-05 ENCOUNTER — Encounter: Payer: Self-pay | Admitting: Cardiovascular Disease

## 2012-03-05 VITALS — BP 147/71 | HR 74 | Ht 63.0 in | Wt 107.0 lb

## 2012-03-05 DIAGNOSIS — I4891 Unspecified atrial fibrillation: Secondary | ICD-10-CM

## 2012-03-05 DIAGNOSIS — I739 Peripheral vascular disease, unspecified: Secondary | ICD-10-CM

## 2012-03-05 DIAGNOSIS — I2510 Atherosclerotic heart disease of native coronary artery without angina pectoris: Secondary | ICD-10-CM | POA: Diagnosis not present

## 2012-03-05 DIAGNOSIS — I779 Disorder of arteries and arterioles, unspecified: Secondary | ICD-10-CM

## 2012-03-05 DIAGNOSIS — I6529 Occlusion and stenosis of unspecified carotid artery: Secondary | ICD-10-CM

## 2012-03-05 NOTE — Assessment & Plan Note (Signed)
Mild to moderate bilateral disease as above. Repeat carotid dopplers in one year.

## 2012-03-05 NOTE — Assessment & Plan Note (Signed)
BP is mildly elevated today but has been mostly controlled per her home log. Will continue amlodipine. No changes today.

## 2012-03-05 NOTE — Progress Notes (Signed)
History of Present Illness: 77 yo WF with history of PAD, CAD s/p CABG 1/10, HTN, hyperlipidemia, former tobacco abuse, paroxysmal atrial fibrillation and COPD who is here today for PV and cardiac follow up. I initially saw her in June of 2010 for PV workup. Her cardiac issues have been followed by Dr. Juanda Chance in the past. At the first visit, she was complaining of right leg cramping with long periods of walking but had no claudication with walking short distances. We elected to manage her conservatively with a walking program. She has done well with conservative management of her PAD. She has done well with this plan. She has not tolerated statins or beta blockers in the past.  She was admitted to [facility] in September 2012 with c/o chest pain. She ruled out for an MI and was discharged home. She wore a 21 day event monitor which showed normal sinus rhythm with no SVT, VT, atrial fibrillation. Non-invasive studies March 04 2012 with ABI of 0.61 on the right and 0.92 on the left. Carotid artery dopplers 03/05/12 are stable with 0-39% RICA stenosis and 40-59% LICA stenosis.  She is doing well. She denies chest pain. She has been walking every day and feeling great. Her BP at home has been well controlled. Her home BP log today shows BP readings mostly under 130 systolically with several outliers. No SOB. No leg pain, rest pain in the legs or LE ulcerations.   Primary Care Physician: Eric Form  Last Lipid Profile:  Followed in primary care.    Past Medical History  Diagnosis Date  . COPD (chronic obstructive pulmonary disease)   . Former smoker   . SOB (shortness of breath)     uncertain etiology  . CAD (coronary artery disease)     status post previous anterior wall myocardial infarction, treated with PTCA dx and subsequent non-ST- elevation myocardial infarction with recent bypass surgery, November 29, 2008.   Marland Kitchen HTN (hypertension)   . Hyperlipidemia   . PVD (peripheral vascular disease)    with claudication  . Arrhythmia 02/2009    Paroxysmal Fibrillation  . Colitis, ischemic     Hx of  . Raynaud phenomenon   . Herpes zoster ophthalmicus   . Postherpetic neuralgia   . Iron deficiency   . Colon neoplasm     Family history of    Past Surgical History  Procedure Date  . Coronary artery bypass graft     11/27/2008  . Appendectomy     Current Outpatient Prescriptions  Medication Sig Dispense Refill  . amLODipine (NORVASC) 5 MG tablet Take 1 tablet (5 mg total) by mouth 2 (two) times daily.  30 tablet  11  . aspirin 81 MG EC tablet Take 81 mg by mouth daily.        . Coenzyme Q10 100 MG capsule Take 100 mg by mouth daily.        Marland Kitchen docusate sodium (COLACE) 100 MG capsule Take 100 mg by mouth as needed.        . nitroGLYCERIN (NITROSTAT) 0.4 MG SL tablet Place 0.4 mg under the tongue as needed.        . Omega-3 Fatty Acids (FISH OIL) 1200 MG CAPS Take 1 capsule by mouth daily.        Marland Kitchen warfarin (COUMADIN) 2.5 MG tablet Use as directed by the Anticoagulation Clinic.         Allergies  Allergen Reactions  . Beta Adrenergic Blockers   .  Cardizem (Diltiazem Hcl)   . Clopidogrel Bisulfate     REACTION: naseau  . Codeine   . Statins     History   Social History  . Marital Status: Widowed    Spouse Name: N/A    Number of Children: N/A  . Years of Education: N/A   Occupational History  . Homemaker    Social History Main Topics  . Smoking status: Former Games developer  . Smokeless tobacco: Not on file   Comment: Tobacco abuse for 65 years, one pack per day, stopped in January 2010.   Marland Kitchen Alcohol Use: Yes     2 alcoholic beverages per day.  . Drug Use: No  . Sexually Active: Not on file   Other Topics Concern  . Not on file   Social History Narrative  . No narrative on file    Family History  Problem Relation Age of Onset  . Colon cancer Father   . Colon cancer Mother     Review of Systems:  As stated in the HPI and otherwise negative.   BP 147/71  Pulse  74  Ht 5\' 3"  (1.6 m)  Wt 107 lb (48.535 kg)  BMI 18.95 kg/m2  Physical Examination: General: Well developed, well nourished, NAD HEENT: OP clear, mucus membranes moist SKIN: warm, dry. No rashes. Neuro: No focal deficits Musculoskeletal: Muscle strength 5/5 all ext Psychiatric: Mood and affect normal Neck: No JVD, no carotid bruits, no thyromegaly, no lymphadenopathy. Lungs:Clear bilaterally, no wheezes, rhonci, crackles Cardiovascular: Regular rate and rhythm. No murmurs, gallops or rubs. Abdomen:Soft. Bowel sounds present. Non-tender.  Extremities: No lower extremity edema. Reticular veins present on both feet and ankles. Pulses are difficult to palpate in the bilateral DP/PT.  Carotid Artery Dopplers: 03/05/12:  0-39% stenosis RICA, 40-59% stenosis LICA  LE arterial dopplers 03/04/12:  Right ABI 0.61, Left ABI 0.92. Stable

## 2012-03-05 NOTE — Patient Instructions (Signed)
Your physician wants you to follow-up in: 12 months.  You will receive a reminder letter in the mail two months in advance. If you don't receive a letter, please call our office to schedule the follow-up appointment.  Your physician has requested that you have an ankle brachial index (ABI). During this test an ultrasound and blood pressure cuff are used to evaluate the arteries that supply the arms and legs with blood. Allow thirty minutes for this exam. There are no restrictions or special instructions. To be done in 12 months.    Your physician has requested that you have a carotid duplex. This test is an ultrasound of the carotid arteries in your neck. It looks at blood flow through these arteries that supply the brain with blood. Allow one hour for this exam. There are no restrictions or special instructions. To be done in 12 months.

## 2012-03-05 NOTE — Assessment & Plan Note (Signed)
Stable. No chest pains. No changes today. She does not tolerate beta blockers or statins.

## 2012-03-05 NOTE — Assessment & Plan Note (Signed)
Her LE dopplers are stable with stable ABI. She has no pain in her legs with walking. No rest pain or ulcerations. Continue walking daily. She will alert [place] if there is a change in her clinical status. Repeat ABI in one year.

## 2012-03-05 NOTE — Assessment & Plan Note (Signed)
Maintaining sinus rhythm. Continue anti-coagulation with coumadin. This is followed in primary care.

## 2012-03-07 ENCOUNTER — Ambulatory Visit: Payer: Medicare Other | Admitting: Cardiovascular Disease

## 2012-03-12 DIAGNOSIS — I509 Heart failure, unspecified: Secondary | ICD-10-CM | POA: Diagnosis not present

## 2012-03-12 DIAGNOSIS — Z7901 Long term (current) use of anticoagulants: Secondary | ICD-10-CM | POA: Diagnosis not present

## 2012-03-12 DIAGNOSIS — M949 Disorder of cartilage, unspecified: Secondary | ICD-10-CM | POA: Diagnosis not present

## 2012-03-12 DIAGNOSIS — R7301 Impaired fasting glucose: Secondary | ICD-10-CM | POA: Diagnosis not present

## 2012-03-19 DIAGNOSIS — Z0000 Encounter for general adult medical examination without abnormal findings: Secondary | ICD-10-CM | POA: Diagnosis not present

## 2012-03-19 DIAGNOSIS — I509 Heart failure, unspecified: Secondary | ICD-10-CM | POA: Diagnosis not present

## 2012-03-19 DIAGNOSIS — E785 Hyperlipidemia, unspecified: Secondary | ICD-10-CM | POA: Diagnosis not present

## 2012-03-19 DIAGNOSIS — R82998 Other abnormal findings in urine: Secondary | ICD-10-CM | POA: Diagnosis not present

## 2012-03-19 DIAGNOSIS — R030 Elevated blood-pressure reading, without diagnosis of hypertension: Secondary | ICD-10-CM | POA: Diagnosis not present

## 2012-03-25 DIAGNOSIS — Z1212 Encounter for screening for malignant neoplasm of rectum: Secondary | ICD-10-CM | POA: Diagnosis not present

## 2012-04-02 DIAGNOSIS — Z7901 Long term (current) use of anticoagulants: Secondary | ICD-10-CM | POA: Diagnosis not present

## 2012-04-02 DIAGNOSIS — I4891 Unspecified atrial fibrillation: Secondary | ICD-10-CM | POA: Diagnosis not present

## 2012-05-07 DIAGNOSIS — Z7901 Long term (current) use of anticoagulants: Secondary | ICD-10-CM | POA: Diagnosis not present

## 2012-05-07 DIAGNOSIS — I4891 Unspecified atrial fibrillation: Secondary | ICD-10-CM | POA: Diagnosis not present

## 2012-05-21 DIAGNOSIS — I4891 Unspecified atrial fibrillation: Secondary | ICD-10-CM | POA: Diagnosis not present

## 2012-05-21 DIAGNOSIS — Z7901 Long term (current) use of anticoagulants: Secondary | ICD-10-CM | POA: Diagnosis not present

## 2012-06-26 DIAGNOSIS — Z7901 Long term (current) use of anticoagulants: Secondary | ICD-10-CM | POA: Diagnosis not present

## 2012-06-26 DIAGNOSIS — I4891 Unspecified atrial fibrillation: Secondary | ICD-10-CM | POA: Diagnosis not present

## 2012-07-09 DIAGNOSIS — S301XXA Contusion of abdominal wall, initial encounter: Secondary | ICD-10-CM | POA: Diagnosis not present

## 2012-07-09 DIAGNOSIS — Z7901 Long term (current) use of anticoagulants: Secondary | ICD-10-CM | POA: Diagnosis not present

## 2012-07-09 DIAGNOSIS — I4891 Unspecified atrial fibrillation: Secondary | ICD-10-CM | POA: Diagnosis not present

## 2012-07-30 DIAGNOSIS — Z7901 Long term (current) use of anticoagulants: Secondary | ICD-10-CM | POA: Diagnosis not present

## 2012-07-30 DIAGNOSIS — I4891 Unspecified atrial fibrillation: Secondary | ICD-10-CM | POA: Diagnosis not present

## 2012-08-29 DIAGNOSIS — Z7901 Long term (current) use of anticoagulants: Secondary | ICD-10-CM | POA: Diagnosis not present

## 2012-08-29 DIAGNOSIS — I4891 Unspecified atrial fibrillation: Secondary | ICD-10-CM | POA: Diagnosis not present

## 2012-10-01 DIAGNOSIS — Z7901 Long term (current) use of anticoagulants: Secondary | ICD-10-CM | POA: Diagnosis not present

## 2012-10-01 DIAGNOSIS — I4891 Unspecified atrial fibrillation: Secondary | ICD-10-CM | POA: Diagnosis not present

## 2012-10-31 DIAGNOSIS — Z7901 Long term (current) use of anticoagulants: Secondary | ICD-10-CM | POA: Diagnosis not present

## 2012-10-31 DIAGNOSIS — I4891 Unspecified atrial fibrillation: Secondary | ICD-10-CM | POA: Diagnosis not present

## 2012-12-03 DIAGNOSIS — Z7901 Long term (current) use of anticoagulants: Secondary | ICD-10-CM | POA: Diagnosis not present

## 2012-12-03 DIAGNOSIS — I4891 Unspecified atrial fibrillation: Secondary | ICD-10-CM | POA: Diagnosis not present

## 2013-01-01 DIAGNOSIS — I4891 Unspecified atrial fibrillation: Secondary | ICD-10-CM | POA: Diagnosis not present

## 2013-01-01 DIAGNOSIS — Z7901 Long term (current) use of anticoagulants: Secondary | ICD-10-CM | POA: Diagnosis not present

## 2013-01-29 DIAGNOSIS — I4891 Unspecified atrial fibrillation: Secondary | ICD-10-CM | POA: Diagnosis not present

## 2013-01-29 DIAGNOSIS — Z7901 Long term (current) use of anticoagulants: Secondary | ICD-10-CM | POA: Diagnosis not present

## 2013-03-04 DIAGNOSIS — Z7901 Long term (current) use of anticoagulants: Secondary | ICD-10-CM | POA: Diagnosis not present

## 2013-03-04 DIAGNOSIS — I4891 Unspecified atrial fibrillation: Secondary | ICD-10-CM | POA: Diagnosis not present

## 2013-03-05 ENCOUNTER — Encounter (INDEPENDENT_AMBULATORY_CARE_PROVIDER_SITE_OTHER): Payer: Medicare Other

## 2013-03-05 DIAGNOSIS — I6529 Occlusion and stenosis of unspecified carotid artery: Secondary | ICD-10-CM

## 2013-03-05 DIAGNOSIS — I2510 Atherosclerotic heart disease of native coronary artery without angina pectoris: Secondary | ICD-10-CM

## 2013-03-05 DIAGNOSIS — I739 Peripheral vascular disease, unspecified: Secondary | ICD-10-CM

## 2013-03-10 ENCOUNTER — Other Ambulatory Visit: Payer: Self-pay | Admitting: Internal Medicine

## 2013-03-10 DIAGNOSIS — E041 Nontoxic single thyroid nodule: Secondary | ICD-10-CM

## 2013-03-11 ENCOUNTER — Encounter: Payer: Self-pay | Admitting: Cardiovascular Disease

## 2013-03-11 ENCOUNTER — Ambulatory Visit (INDEPENDENT_AMBULATORY_CARE_PROVIDER_SITE_OTHER): Payer: Medicare Other | Admitting: Cardiovascular Disease

## 2013-03-11 ENCOUNTER — Ambulatory Visit (INDEPENDENT_AMBULATORY_CARE_PROVIDER_SITE_OTHER): Payer: Medicare Other | Admitting: Cardiology

## 2013-03-11 VITALS — BP 152/78 | HR 69 | Ht 62.5 in | Wt 113.8 lb

## 2013-03-11 DIAGNOSIS — I2510 Atherosclerotic heart disease of native coronary artery without angina pectoris: Secondary | ICD-10-CM

## 2013-03-11 DIAGNOSIS — I739 Peripheral vascular disease, unspecified: Secondary | ICD-10-CM

## 2013-03-11 DIAGNOSIS — I779 Disorder of arteries and arterioles, unspecified: Secondary | ICD-10-CM

## 2013-03-11 DIAGNOSIS — I10 Essential (primary) hypertension: Secondary | ICD-10-CM

## 2013-03-11 DIAGNOSIS — I4891 Unspecified atrial fibrillation: Secondary | ICD-10-CM

## 2013-03-11 NOTE — Progress Notes (Signed)
History of Present Illness: 79 yo WF with history of PAD, CAD s/p CABG 1/10, HTN, hyperlipidemia, former tobacco abuse, paroxysmal atrial fibrillation and COPD who is here today for PV and cardiac follow up. I initially saw her in June of 2010 for PV workup. Her cardiac issues have been followed by Dr. Juanda Chance in the past. At the first visit, she was complaining of right leg cramping with long periods of walking but had no claudication with walking short distances. We elected to manage her conservatively with a walking program. She has done well with conservative management of her PAD. She has not tolerated statins or beta blockers in the past. She was admitted to [facility] in September 2012 with c/o chest pain. She ruled out for an MI and was discharged home. She wore a 21 day event monitor which showed normal sinus rhythm with no SVT, VT, atrial fibrillation. Non-invasive studies March 04 2012 with ABI of 0.61 on the right and 0.92 on the left. Carotid artery dopplers 03/05/13 with 0-39% RICA stenosis and mild progression of LICA stenosis, now 60-79%. ABI stable at 0.69 on right and 0.92 on left. Incidental finding of echogenic vascularized structures in the bilateral thyroid. (structure in right measured 1.0 x 1.4 cm transverse, structure in left measured 1.3 x 1.4 cm and 2.7 cm long).   She is doing well. She denies chest pain. She has been walking every day and feeling great. Her BP at home has been well controlled.  No SOB. No leg pain, rest pain in the legs or LE ulcerations. Her BP at home is 120/60s.   Primary Care Physician: Eric Form   Last Lipid Profile: Followed in primary care.   Past Medical History  Diagnosis Date  . COPD (chronic obstructive pulmonary disease)   . Former smoker   . SOB (shortness of breath)     uncertain etiology  . CAD (coronary artery disease)     status post previous anterior wall myocardial infarction, treated with PTCA dx and subsequent non-ST- elevation  myocardial infarction with recent bypass surgery, November 29, 2008.   Marland Kitchen HTN (hypertension)   . Hyperlipidemia   . PVD (peripheral vascular disease)     with claudication  . Arrhythmia 02/2009    Paroxysmal Fibrillation  . Colitis, ischemic     Hx of  . Raynaud phenomenon   . Herpes zoster ophthalmicus   . Postherpetic neuralgia   . Iron deficiency   . Colon neoplasm     Family history of    Past Surgical History  Procedure Laterality Date  . Coronary artery bypass graft      11/27/2008  . Appendectomy      Current Outpatient Prescriptions  Medication Sig Dispense Refill  . ALPRAZolam (XANAX) 0.25 MG tablet Take 0.25 mg by mouth every 8 (eight) hours as needed for sleep.      Marland Kitchen aspirin 81 MG EC tablet Take 81 mg by mouth daily.        . Coenzyme Q10 100 MG capsule Take 100 mg by mouth daily.        Marland Kitchen docusate sodium (COLACE) 100 MG capsule Take 100 mg by mouth as needed.        . Fish Oil-Cholecalciferol 1200-1000 MG-UNIT CAPS Take by mouth.      . nitroGLYCERIN (NITROSTAT) 0.4 MG SL tablet Place 0.4 mg under the tongue as needed.        . vitamin B-12 (CYANOCOBALAMIN) 500 MCG tablet Take 500  mcg by mouth daily.      Marland Kitchen warfarin (COUMADIN) 2.5 MG tablet Use as directed by the Anticoagulation Clinic.       Marland Kitchen amLODipine (NORVASC) 5 MG tablet Take 1 tablet (5 mg total) by mouth 2 (two) times daily.  30 tablet  11   No current facility-administered medications for this visit.    Allergies  Allergen Reactions  . Beta Adrenergic Blockers   . Cardizem (Diltiazem Hcl)   . Clopidogrel Bisulfate     REACTION: naseau  . Codeine   . Statins     History   Social History  . Marital Status: Widowed    Spouse Name: N/A    Number of Children: N/A  . Years of Education: N/A   Occupational History  . Homemaker    Social History Main Topics  . Smoking status: Former Games developer  . Smokeless tobacco: Not on file     Comment: Tobacco abuse for 65 years, one pack per day, stopped in  January 2010.   Marland Kitchen Alcohol Use: Yes     Comment: 2 alcoholic beverages per day.  . Drug Use: No  . Sexually Active: Not on file   Other Topics Concern  . Not on file   Social History Narrative  . No narrative on file    Family History  Problem Relation Age of Onset  . Colon cancer Father   . Colon cancer Mother     Review of Systems:  As stated in the HPI and otherwise negative.   BP 152/78  Pulse 69  Ht 5' 2.5" (1.588 m)  Wt 113 lb 12.8 oz (51.619 kg)  BMI 20.47 kg/m2  Physical Examination: General: Well developed, well nourished, NAD HEENT: OP clear, mucus membranes moist SKIN: warm, dry. No rashes. Neuro: No focal deficits Musculoskeletal: Muscle strength 5/5 all ext Psychiatric: Mood and affect normal Neck: No JVD, no carotid bruits, no thyromegaly, no lymphadenopathy. Lungs:Clear bilaterally, no wheezes, rhonci, crackles Cardiovascular: Regular rate and rhythm. No murmurs, gallops or rubs. Abdomen:Soft. Bowel sounds present. Non-tender.  Extremities: No lower extremity edema. Pulses are 2 + in the bilateral DP/PT.  EKG: NSR, rate 69 bpm  Assessment and Plan:   1. PAD:  Her LE dopplers are stable with stable ABI. She has no pain in her legs with walking. No rest pain or ulcerations. Continue walking daily. She will alert [place] if there is a change in her clinical status. Repeat ABI in one year.     2. HYPERTENSION:  BP is mildly elevated today but has been mostly controlled per her home log. Will continue amlodipine. No changes today.     3. CAD:  Stable. No chest pains. No changes today. She does not tolerate beta blockers or statins.     4. CAROTID ARTERY DISEASE:  Mild to moderate bilateral disease as above. Repeat carotid dopplers in six months.      5. ATRIAL FIBRILLATION: Maintaining sinus rhythm. Continue anti-coagulation with coumadin. This is followed in primary care.      6. Thyroid mass: Dr. Clelia Croft has planned dedicated thyroid u/s tomorrow.

## 2013-03-11 NOTE — Patient Instructions (Signed)
Your physician wants you to follow-up in: 6 months. You will receive a reminder letter in the mail two months in advance. If you don't receive a letter, please call our office to schedule the follow-up appointment.  Your physician has requested that you have a carotid duplex. This test is an ultrasound of the carotid arteries in your neck. It looks at blood flow through these arteries that supply the brain with blood. Allow one hour for this exam. There are no restrictions or special instructions. To be done at the end of October 2014

## 2013-03-12 ENCOUNTER — Ambulatory Visit
Admission: RE | Admit: 2013-03-12 | Discharge: 2013-03-12 | Disposition: A | Discharge status: Home / Self Care | Payer: Medicare Other | Source: Ambulatory Visit | Attending: Internal Medicine | Admitting: Internal Medicine

## 2013-03-12 DIAGNOSIS — E041 Nontoxic single thyroid nodule: Secondary | ICD-10-CM

## 2013-03-12 DIAGNOSIS — E042 Nontoxic multinodular goiter: Secondary | ICD-10-CM | POA: Diagnosis not present

## 2013-03-26 ENCOUNTER — Encounter: Payer: Self-pay | Admitting: Cardiovascular Disease

## 2013-03-26 DIAGNOSIS — E041 Nontoxic single thyroid nodule: Secondary | ICD-10-CM | POA: Diagnosis not present

## 2013-03-26 DIAGNOSIS — I2510 Atherosclerotic heart disease of native coronary artery without angina pectoris: Secondary | ICD-10-CM | POA: Diagnosis not present

## 2013-03-26 DIAGNOSIS — E785 Hyperlipidemia, unspecified: Secondary | ICD-10-CM | POA: Diagnosis not present

## 2013-03-26 DIAGNOSIS — R82998 Other abnormal findings in urine: Secondary | ICD-10-CM | POA: Diagnosis not present

## 2013-03-26 DIAGNOSIS — M949 Disorder of cartilage, unspecified: Secondary | ICD-10-CM | POA: Diagnosis not present

## 2013-04-02 DIAGNOSIS — I4891 Unspecified atrial fibrillation: Secondary | ICD-10-CM | POA: Diagnosis not present

## 2013-04-02 DIAGNOSIS — I2510 Atherosclerotic heart disease of native coronary artery without angina pectoris: Secondary | ICD-10-CM | POA: Diagnosis not present

## 2013-04-02 DIAGNOSIS — Z951 Presence of aortocoronary bypass graft: Secondary | ICD-10-CM | POA: Diagnosis not present

## 2013-04-02 DIAGNOSIS — Z7901 Long term (current) use of anticoagulants: Secondary | ICD-10-CM | POA: Diagnosis not present

## 2013-04-02 DIAGNOSIS — I7389 Other specified peripheral vascular diseases: Secondary | ICD-10-CM | POA: Diagnosis not present

## 2013-04-02 DIAGNOSIS — Z1331 Encounter for screening for depression: Secondary | ICD-10-CM | POA: Diagnosis not present

## 2013-04-02 DIAGNOSIS — Z0000 Encounter for general adult medical examination without abnormal findings: Secondary | ICD-10-CM | POA: Diagnosis not present

## 2013-04-02 DIAGNOSIS — R030 Elevated blood-pressure reading, without diagnosis of hypertension: Secondary | ICD-10-CM | POA: Diagnosis not present

## 2013-04-02 DIAGNOSIS — I509 Heart failure, unspecified: Secondary | ICD-10-CM | POA: Diagnosis not present

## 2013-04-02 DIAGNOSIS — IMO0002 Reserved for concepts with insufficient information to code with codable children: Secondary | ICD-10-CM | POA: Diagnosis not present

## 2013-04-09 DIAGNOSIS — Z1212 Encounter for screening for malignant neoplasm of rectum: Secondary | ICD-10-CM | POA: Diagnosis not present

## 2013-04-17 DIAGNOSIS — Z7901 Long term (current) use of anticoagulants: Secondary | ICD-10-CM | POA: Diagnosis not present

## 2013-04-17 DIAGNOSIS — I4891 Unspecified atrial fibrillation: Secondary | ICD-10-CM | POA: Diagnosis not present

## 2013-04-28 NOTE — Progress Notes (Signed)
Orders only encounter

## 2013-04-30 DIAGNOSIS — I4891 Unspecified atrial fibrillation: Secondary | ICD-10-CM | POA: Diagnosis not present

## 2013-04-30 DIAGNOSIS — Z7901 Long term (current) use of anticoagulants: Secondary | ICD-10-CM | POA: Diagnosis not present

## 2013-06-04 DIAGNOSIS — I4891 Unspecified atrial fibrillation: Secondary | ICD-10-CM | POA: Diagnosis not present

## 2013-06-04 DIAGNOSIS — Z7901 Long term (current) use of anticoagulants: Secondary | ICD-10-CM | POA: Diagnosis not present

## 2013-06-13 ENCOUNTER — Other Ambulatory Visit: Payer: Self-pay | Admitting: Internal Medicine

## 2013-06-13 DIAGNOSIS — E041 Nontoxic single thyroid nodule: Secondary | ICD-10-CM

## 2013-06-16 ENCOUNTER — Other Ambulatory Visit: Payer: Medicare Other

## 2013-07-08 DIAGNOSIS — Z7901 Long term (current) use of anticoagulants: Secondary | ICD-10-CM | POA: Diagnosis not present

## 2013-07-08 DIAGNOSIS — I4891 Unspecified atrial fibrillation: Secondary | ICD-10-CM | POA: Diagnosis not present

## 2013-08-12 DIAGNOSIS — Z7901 Long term (current) use of anticoagulants: Secondary | ICD-10-CM | POA: Diagnosis not present

## 2013-08-12 DIAGNOSIS — I4891 Unspecified atrial fibrillation: Secondary | ICD-10-CM | POA: Diagnosis not present

## 2013-09-09 ENCOUNTER — Encounter (INDEPENDENT_AMBULATORY_CARE_PROVIDER_SITE_OTHER): Payer: Self-pay

## 2013-09-09 ENCOUNTER — Ambulatory Visit (INDEPENDENT_AMBULATORY_CARE_PROVIDER_SITE_OTHER): Payer: Medicare Other | Admitting: Cardiovascular Disease

## 2013-09-09 ENCOUNTER — Ambulatory Visit (HOSPITAL_COMMUNITY): Payer: Medicare Other | Attending: Cardiology

## 2013-09-09 ENCOUNTER — Encounter: Payer: Self-pay | Admitting: Cardiovascular Disease

## 2013-09-09 VITALS — BP 182/82 | HR 70 | Ht 62.5 in | Wt 115.0 lb

## 2013-09-09 DIAGNOSIS — Z87891 Personal history of nicotine dependence: Secondary | ICD-10-CM | POA: Insufficient documentation

## 2013-09-09 DIAGNOSIS — I2510 Atherosclerotic heart disease of native coronary artery without angina pectoris: Secondary | ICD-10-CM

## 2013-09-09 DIAGNOSIS — I739 Peripheral vascular disease, unspecified: Secondary | ICD-10-CM | POA: Diagnosis not present

## 2013-09-09 DIAGNOSIS — I658 Occlusion and stenosis of other precerebral arteries: Secondary | ICD-10-CM | POA: Insufficient documentation

## 2013-09-09 DIAGNOSIS — E785 Hyperlipidemia, unspecified: Secondary | ICD-10-CM | POA: Diagnosis not present

## 2013-09-09 DIAGNOSIS — I10 Essential (primary) hypertension: Secondary | ICD-10-CM | POA: Insufficient documentation

## 2013-09-09 DIAGNOSIS — J449 Chronic obstructive pulmonary disease, unspecified: Secondary | ICD-10-CM | POA: Insufficient documentation

## 2013-09-09 DIAGNOSIS — I6529 Occlusion and stenosis of unspecified carotid artery: Secondary | ICD-10-CM | POA: Diagnosis not present

## 2013-09-09 DIAGNOSIS — Z951 Presence of aortocoronary bypass graft: Secondary | ICD-10-CM | POA: Insufficient documentation

## 2013-09-09 DIAGNOSIS — I779 Disorder of arteries and arterioles, unspecified: Secondary | ICD-10-CM

## 2013-09-09 DIAGNOSIS — J4489 Other specified chronic obstructive pulmonary disease: Secondary | ICD-10-CM | POA: Insufficient documentation

## 2013-09-09 DIAGNOSIS — I4891 Unspecified atrial fibrillation: Secondary | ICD-10-CM

## 2013-09-09 NOTE — Progress Notes (Signed)
History of Present Illness: 79 yo WF with history of PAD, CAD s/p CABG 1/10, HTN, hyperlipidemia, former tobacco abuse, paroxysmal atrial fibrillation and COPD who is here today for PV and cardiac follow up. I initially saw her in June of 2010 for PV workup. Her cardiac issues have been followed by Dr. Juanda Chance in the past. She has done well with conservative management of her PAD. ABI April 2014 stable at 0.69 on right and 0.92 on left. She has not tolerated statins or beta blockers in the past. She was admitted to [facility] in September 2012 with c/o chest pain. She ruled out for an MI and was discharged home. She wore a 21 day event monitor which showed normal sinus rhythm with no SVT, VT, atrial fibrillation.  Carotid artery dopplers 09/09/13 with 0-39% RICA stenosis and 60-79% LICA stenosis.  Incidental finding of echogenic vascularized structures in the bilateral thyroid. This has been addressed by Dr. Clelia Croft in primary care. She has refused biopsy of her thyroid.   She is doing well. She has had recent episodes of chest pain, mostly at rest but some with exertion. Described as pressure. Her BP at home has been well controlled.  No SOB. No leg pain, rest pain in the legs or LE ulcerations. Her BP at home is 120/60s. She does have a red, scaly rash on arms, legs, shoulders.   Primary Care Physician: Eric Form   Last Lipid Profile: Followed in primary care.   Past Medical History  Diagnosis Date  . COPD (chronic obstructive pulmonary disease)   . Former smoker   . SOB (shortness of breath)     uncertain etiology  . CAD (coronary artery disease)     status post previous anterior wall myocardial infarction, treated with PTCA dx and subsequent non-ST- elevation myocardial infarction with recent bypass surgery, November 29, 2008.   Marland Kitchen HTN (hypertension)   . Hyperlipidemia   . PVD (peripheral vascular disease)     with claudication  . Arrhythmia 02/2009    Paroxysmal Fibrillation  . Colitis,  ischemic     Hx of  . Raynaud phenomenon   . Herpes zoster ophthalmicus   . Postherpetic neuralgia   . Iron deficiency   . Colon neoplasm     Family history of    Past Surgical History  Procedure Laterality Date  . Coronary artery bypass graft      11/27/2008  . Appendectomy      Current Outpatient Prescriptions  Medication Sig Dispense Refill  . amLODipine (NORVASC) 5 MG tablet Take 1 tablet (5 mg total) by mouth 2 (two) times daily.  30 tablet  11  . aspirin 81 MG EC tablet Take 81 mg by mouth daily.        . Coenzyme Q10 100 MG capsule Take 100 mg by mouth as needed.       . docusate sodium (COLACE) 100 MG capsule Take 100 mg by mouth as needed.       . Fish Oil-Cholecalciferol 1200-1000 MG-UNIT CAPS Take by mouth as needed.       . nitroGLYCERIN (NITROSTAT) 0.4 MG SL tablet Place 0.4 mg under the tongue as needed.        . vitamin B-12 (CYANOCOBALAMIN) 500 MCG tablet Take 500 mcg by mouth as needed.       . warfarin (COUMADIN) 2.5 MG tablet Use as directed by the Anticoagulation Clinic.       Marland Kitchen ALPRAZolam (XANAX) 0.25 MG tablet  Take 0.25 mg by mouth every 8 (eight) hours as needed for sleep.       No current facility-administered medications for this visit.    Allergies  Allergen Reactions  . Beta Adrenergic Blockers   . Cardizem [Diltiazem Hcl]   . Clopidogrel Bisulfate     REACTION: naseau  . Codeine   . Statins     History   Social History  . Marital Status: Widowed    Spouse Name: N/A    Number of Children: N/A  . Years of Education: N/A   Occupational History  . Homemaker    Social History Main Topics  . Smoking status: Former Games developer  . Smokeless tobacco: Not on file     Comment: Tobacco abuse for 65 years, one pack per day, stopped in January 2010.   Marland Kitchen Alcohol Use: Yes     Comment: 2 alcoholic beverages per day.  . Drug Use: No  . Sexual Activity: Not on file   Other Topics Concern  . Not on file   Social History Narrative  . No narrative on  file    Family History  Problem Relation Age of Onset  . Colon cancer Father   . Colon cancer Mother     Review of Systems:  As stated in the HPI and otherwise negative.   BP 182/82  Pulse 70  Ht 5' 2.5" (1.588 m)  Wt 115 lb (52.164 kg)  BMI 20.69 kg/m2  SpO2 97%  Physical Examination: General: Well developed, well nourished, NAD HEENT: OP clear, mucus membranes moist SKIN: warm, dry. No rashes. Neuro: No focal deficits Musculoskeletal: Muscle strength 5/5 all ext Psychiatric: Mood and affect normal Neck: No JVD, no carotid bruits, no thyromegaly, no lymphadenopathy. Lungs:Clear bilaterally, no wheezes, rhonci, crackles Cardiovascular: Regular rate and rhythm. No murmurs, gallops or rubs. Abdomen:Soft. Bowel sounds present. Non-tender.  Extremities: No lower extremity edema. Pulses are trace to 1 + in the bilateral DP/PT.  Assessment and Plan:   1. PAD:  Her LE dopplers are stable with stable ABI in April 2014. She has no pain in her legs with walking. No rest pain or ulcerations. Continue walking daily. She will alert [place] if there is a change in her clinical status. Repeat ABI April 2015.  2. HYPERTENSION:  BP is elevated today but has been controlled per her home log. Will continue amlodipine. No changes today.     3. CAD:  Recent chest pressure. No ischemic testing since her bypass in 2010. Will arrange exercise stress myoview to exclude ischemia. She does not tolerate beta blockers or statins.     4. CAROTID ARTERY DISEASE:  Mild to moderate bilateral disease by dopplers today as above. Repeat carotid dopplers in six months.      5. ATRIAL FIBRILLATION: Maintaining sinus rhythm. Continue anti-coagulation with coumadin. This is followed in primary care.      6. Thyroid mass: Workup per Dr. Clelia Croft. I have encourage her to consider proceeding with the biopsy as recommended by Dr. Clelia Croft.   7. Rash: scaly plaques. Does not appear consistent with drug reaction or contact  dermatitis. No recent changes in meds. Follow up in primary care.

## 2013-09-09 NOTE — Patient Instructions (Signed)
Your physician wants you to follow-up in:  6 months.  You will receive a reminder letter in the mail two months in advance. If you don't receive a letter, please call our office to schedule the follow-up appointment.  Your physician has requested that you have an exercise stress myoview. For further information please visit [email or web address]. Please follow instruction sheet, as given.  

## 2013-09-10 DIAGNOSIS — R21 Rash and other nonspecific skin eruption: Secondary | ICD-10-CM | POA: Diagnosis not present

## 2013-09-10 DIAGNOSIS — Z7901 Long term (current) use of anticoagulants: Secondary | ICD-10-CM | POA: Diagnosis not present

## 2013-09-10 DIAGNOSIS — I4891 Unspecified atrial fibrillation: Secondary | ICD-10-CM | POA: Diagnosis not present

## 2013-09-17 ENCOUNTER — Telehealth: Payer: Self-pay | Admitting: Cardiovascular Disease

## 2013-09-17 ENCOUNTER — Telehealth: Payer: Self-pay | Admitting: *Deleted

## 2013-09-17 NOTE — Telephone Encounter (Signed)
Left message for patient to call and schedule stress test. °

## 2013-09-17 NOTE — Telephone Encounter (Signed)
Left message on pt's identified voicemail to call office if she would like to schedule stress test.

## 2013-09-17 NOTE — Addendum Note (Signed)
Addended by: Dossie Arbour on: 09/17/2013 03:53 PM   Modules accepted: Orders

## 2013-09-17 NOTE — Telephone Encounter (Signed)
Follow Up   Pt states that she is feeling better and does not need this test.. ( Stress test)

## 2013-11-11 DIAGNOSIS — Z7901 Long term (current) use of anticoagulants: Secondary | ICD-10-CM | POA: Diagnosis not present

## 2013-11-11 DIAGNOSIS — I4891 Unspecified atrial fibrillation: Secondary | ICD-10-CM | POA: Diagnosis not present

## 2013-11-16 ENCOUNTER — Telehealth: Payer: Self-pay | Admitting: Internal Medicine

## 2013-11-16 ENCOUNTER — Emergency Department (HOSPITAL_COMMUNITY)
Admission: EM | Admit: 2013-11-16 | Discharge: 2013-11-16 | Disposition: A | Discharge status: Home / Self Care | Payer: Medicare Other | Attending: Emergency Medicine | Admitting: Emergency Medicine

## 2013-11-16 ENCOUNTER — Encounter (HOSPITAL_COMMUNITY): Payer: Self-pay | Admitting: Emergency Medicine

## 2013-11-16 DIAGNOSIS — Z7901 Long term (current) use of anticoagulants: Secondary | ICD-10-CM | POA: Insufficient documentation

## 2013-11-16 DIAGNOSIS — I2510 Atherosclerotic heart disease of native coronary artery without angina pectoris: Secondary | ICD-10-CM | POA: Diagnosis not present

## 2013-11-16 DIAGNOSIS — Z8619 Personal history of other infectious and parasitic diseases: Secondary | ICD-10-CM | POA: Insufficient documentation

## 2013-11-16 DIAGNOSIS — Y92009 Unspecified place in unspecified non-institutional (private) residence as the place of occurrence of the external cause: Secondary | ICD-10-CM | POA: Insufficient documentation

## 2013-11-16 DIAGNOSIS — Z7982 Long term (current) use of aspirin: Secondary | ICD-10-CM | POA: Insufficient documentation

## 2013-11-16 DIAGNOSIS — T18108A Unspecified foreign body in esophagus causing other injury, initial encounter: Secondary | ICD-10-CM

## 2013-11-16 DIAGNOSIS — Z862 Personal history of diseases of the blood and blood-forming organs and certain disorders involving the immune mechanism: Secondary | ICD-10-CM | POA: Insufficient documentation

## 2013-11-16 DIAGNOSIS — J449 Chronic obstructive pulmonary disease, unspecified: Secondary | ICD-10-CM | POA: Insufficient documentation

## 2013-11-16 DIAGNOSIS — Z87891 Personal history of nicotine dependence: Secondary | ICD-10-CM | POA: Diagnosis not present

## 2013-11-16 DIAGNOSIS — Z79899 Other long term (current) drug therapy: Secondary | ICD-10-CM | POA: Insufficient documentation

## 2013-11-16 DIAGNOSIS — I10 Essential (primary) hypertension: Secondary | ICD-10-CM | POA: Insufficient documentation

## 2013-11-16 DIAGNOSIS — Z8719 Personal history of other diseases of the digestive system: Secondary | ICD-10-CM | POA: Insufficient documentation

## 2013-11-16 DIAGNOSIS — R6889 Other general symptoms and signs: Secondary | ICD-10-CM | POA: Diagnosis not present

## 2013-11-16 DIAGNOSIS — Z8639 Personal history of other endocrine, nutritional and metabolic disease: Secondary | ICD-10-CM | POA: Insufficient documentation

## 2013-11-16 DIAGNOSIS — R0789 Other chest pain: Secondary | ICD-10-CM | POA: Diagnosis not present

## 2013-11-16 DIAGNOSIS — IMO0002 Reserved for concepts with insufficient information to code with codable children: Secondary | ICD-10-CM | POA: Insufficient documentation

## 2013-11-16 DIAGNOSIS — Y9389 Activity, other specified: Secondary | ICD-10-CM | POA: Insufficient documentation

## 2013-11-16 DIAGNOSIS — Z951 Presence of aortocoronary bypass graft: Secondary | ICD-10-CM | POA: Diagnosis not present

## 2013-11-16 DIAGNOSIS — J4489 Other specified chronic obstructive pulmonary disease: Secondary | ICD-10-CM | POA: Insufficient documentation

## 2013-11-16 NOTE — ED Notes (Addendum)
Per EMS: pt from home c/o feeling like food is stuck in throat since eating dinner of [place], potato and rice last nigh; fluids passing but unable to eat food; pt sts hx of same 3 days ago that resolved without intervention

## 2013-11-16 NOTE — ED Provider Notes (Addendum)
CSN: [id number]     Arrival date & time 11/16/13  1309 History   First MD Initiated Contact with Patient 11/16/13 1323     Chief Complaint  Patient presents with  . Foreign Body   (Consider location/radiation/quality/duration/timing/severity/associated sxs/prior Treatment) HPI Comments: Patient presents with the sensation of food stuck in her throat. She states she was eating some [place] and brussel sprouts last night and felt like it got stuck in her esophagus at that point. She was feeling a bit better this morning although she felt like it never really cleared and about noon she ate some scrambled eggs and toast and since that time hasn't been able swallow anything down. She denies any shortness of breath. She does feel some tightness in the center for chest taht radiates across since this is been going on. She says anytime she swallows anything it comes right back out. She had a history of this once in the past and she's unsure but resolved spontaneously.  Patient is a 80 y.o. female presenting with foreign body.  Foreign Body Associated symptoms: vomiting   Associated symptoms: no abdominal pain, no congestion, no cough, no nausea and no rhinorrhea     Past Medical History  Diagnosis Date  . COPD (chronic obstructive pulmonary disease)   . Former smoker   . SOB (shortness of breath)     uncertain etiology  . CAD (coronary artery disease)     status post previous anterior wall myocardial infarction, treated with PTCA dx and subsequent non-ST- elevation myocardial infarction with recent bypass surgery, November 29, 2008.   Marland Kitchen HTN (hypertension)   . Hyperlipidemia   . PVD (peripheral vascular disease)     with claudication  . Arrhythmia 02/2009    Paroxysmal Fibrillation  . Colitis, ischemic     Hx of  . Raynaud phenomenon   . Herpes zoster ophthalmicus   . Postherpetic neuralgia   . Iron deficiency   . Colon neoplasm     Family history of   Past Surgical History  Procedure  Laterality Date  . Coronary artery bypass graft      11/27/2008  . Appendectomy     Family History  Problem Relation Age of Onset  . Colon cancer Father   . Colon cancer Mother    History  Substance Use Topics  . Smoking status: Former Research scientist (life sciences)  . Smokeless tobacco: Not on file     Comment: Tobacco abuse for 65 years, one pack per day, stopped in January 2010.   Marland Kitchen Alcohol Use: Yes     Comment: 2 alcoholic beverages per day.   OB History   Grav Para Term Preterm Abortions TAB SAB Ect Mult Living                 Review of Systems  Constitutional: Negative for fever, chills, diaphoresis and fatigue.  HENT: Negative for congestion, rhinorrhea and sneezing.   Eyes: Negative.   Respiratory: Positive for chest tightness. Negative for cough and shortness of breath.   Cardiovascular: Negative for chest pain and leg swelling.  Gastrointestinal: Positive for vomiting. Negative for nausea, abdominal pain, diarrhea and blood in stool.  Genitourinary: Negative for frequency, hematuria, flank pain and difficulty urinating.  Musculoskeletal: Negative for arthralgias and back pain.  Skin: Negative for rash.  Neurological: Negative for dizziness, speech difficulty, weakness, numbness and headaches.    Allergies  Beta adrenergic blockers; Cardizem; Clopidogrel bisulfate; Codeine; and Statins  Home Medications   Current Outpatient Rx  Name  Route  Sig  Dispense  Refill  . ALPRAZolam (XANAX) 0.25 MG tablet   Oral   Take 0.25 mg by mouth every 8 (eight) hours as needed for sleep.         Marland Kitchen amLODipine (NORVASC) 5 MG tablet   Oral   Take 1 tablet (5 mg total) by mouth 2 (two) times daily.   30 tablet   11   . aspirin 81 MG EC tablet   Oral   Take 81 mg by mouth daily.           Marland Kitchen docusate sodium (COLACE) 100 MG capsule   Oral   Take 100 mg by mouth as needed.          . nitroGLYCERIN (NITROSTAT) 0.4 MG SL tablet   Sublingual   Place 0.4 mg under the tongue as needed.            . warfarin (COUMADIN) 2.5 MG tablet   Oral   Take 2.5-3.75 mg by mouth See admin instructions. Monday, Wednesday, Friday and Sunday take 1 1/2 tablets. On Tuesday Thursday and Saturday take 1 tablet          BP 159/58  Pulse 70  Resp 19  SpO2 96% Physical Exam  Constitutional: She is oriented to person, place, and time. She appears well-developed and well-nourished.  HENT:  Head: Normocephalic and atraumatic.  Eyes: Pupils are equal, round, and reactive to light.  Neck: Normal range of motion. Neck supple.  Cardiovascular: Normal rate, regular rhythm and normal heart sounds.   Pulmonary/Chest: Effort normal and breath sounds normal. No respiratory distress. She has no wheezes. She has no rales. She exhibits no tenderness.  Talking in complete sentences  Abdominal: Soft. Bowel sounds are normal. There is no tenderness. There is no rebound and no guarding.  Musculoskeletal: Normal range of motion. She exhibits no edema.  Lymphadenopathy:    She has no cervical adenopathy.  Neurological: She is alert and oriented to person, place, and time.  Skin: Skin is warm and dry. No rash noted.  Psychiatric: She has a normal mood and affect.    ED Course  Procedures (including critical care time) Labs Review Labs Reviewed  PROTIME-INR   Imaging Review No results found.  EKG Interpretation    Date/Time:  Sunday November 16 2013 13:23:53 EST Ventricular Rate:  72 PR Interval:  169 QRS Duration: 86 QT Interval:  408 QTC Calculation: 446 R Axis:   86 Text Interpretation:  Sinus rhythm Borderline right axis deviation since last tracing no significant change Confirmed by Delva Derden  MD, Rayelynn Loyal ([id number]) on 11/16/2013 2:07:35 PM            MDM   1. Esophageal foreign body, initial encounter    Patient try to drink some water and it came right back up. I spoke with Dr. Carlean Purl who is going to take the patient to the endoscopy suite.  14:20 patient was able to pass the foreign  body spontaneously. She was able to drink water after this was feeling much better. She denies any ongoing chest tightness. Her EKG did not show any ischemic changes. She was discharged home and will followup this week with her gastroenterologist.  Malvin Johns, MD 11/16/13 [place], MD 11/16/13 1420

## 2013-11-16 NOTE — Discharge Instructions (Signed)
Return to the emergency department if you have any ongoing trouble swallowing or vomiting. Followup with your gastroenterologist.

## 2013-11-16 NOTE — ED Notes (Signed)
Patient has passed the food. She is no longer spitting out saliva, she is talking normally, and she is able to drink water without difficulty. Family has been texting with GI, MD and they are ready to take her home and follow up as outpatient. Dr. Tamera Punt aware.

## 2013-11-16 NOTE — Telephone Encounter (Signed)
Patient's daughter called - mom had food impaction with toast and eggs after having difficulty eating meat  Yesterday.  Food passed today after about 2 hours.She was in ED then. Able to take water.  Advised liquid and soft diet and we would call to arrange f/u through office.

## 2013-11-17 NOTE — Telephone Encounter (Signed)
Patient is scheduled for office visit with Tye Savoy RNP on 11/20/13 10:00

## 2013-11-20 ENCOUNTER — Encounter: Payer: Self-pay | Admitting: Nurse Practitioner

## 2013-11-20 ENCOUNTER — Ambulatory Visit (INDEPENDENT_AMBULATORY_CARE_PROVIDER_SITE_OTHER): Payer: Medicare Other | Admitting: Nurse Practitioner

## 2013-11-20 VITALS — BP 160/88 | HR 78 | Ht 62.5 in | Wt 115.0 lb

## 2013-11-20 DIAGNOSIS — R1319 Other dysphagia: Secondary | ICD-10-CM | POA: Diagnosis not present

## 2013-11-20 NOTE — Patient Instructions (Signed)
Take OTC Prilosec 20 mg daily 20-30 minutes before breakfast. We have given you GERD information. Take small bites, eat slowly, fluids between bites to prevent food impaction. Call in 10 days to report on symptoms.  If no better you will be considered for barium swallow. CC:  Lutricia Feil MD

## 2013-11-21 ENCOUNTER — Encounter: Payer: Self-pay | Admitting: Nurse Practitioner

## 2013-11-21 DIAGNOSIS — R1319 Other dysphagia: Secondary | ICD-10-CM | POA: Insufficient documentation

## 2013-11-21 NOTE — Progress Notes (Signed)
HPI :  Patient is an 80 year old female known remotely to Dr. Olevia Perches. Patient is here with her daughter for evaluation of dysphagia. On1/3/14 patient ate some chopped up pecan just before going to bed. That night her throat / chest didn't feel just right. Patient has CAD/ history of CABG and gets ocassional chest discomfort for which she takes NTG. This didn't feel like her typical chest symptoms. Per daughter, she was having some gurgling in her chest as well. The next morning patient made toast and eggs for breakfast. It was at this point she knew that food was getting stuck in her throat. She began foaming at the mouth, still with gurgling sounds in chest. Patient went to ED for evaluation. Dr. Carlean Purl, our on call MD was called by ED staff. Thankfully EGD wasn't necessary as food bolus passed after patient drank some water.   Ms. Wachter does describe recent reflux, especially at night. She thought reflux was secondary to coumadin so changed coumadin dose to morning and reflux has improved.   Past Medical History  Diagnosis Date  . COPD (chronic obstructive pulmonary disease)   . Former smoker   . CAD (coronary artery disease)     status post previous anterior wall myocardial infarction, treated with PTCA dx and subsequent non-ST- elevation myocardial infarction with recent bypass surgery, November 29, 2008.   Marland Kitchen HTN (hypertension)   . Hyperlipidemia   . PVD (peripheral vascular disease)     with claudication  . Arrhythmia 02/2009    Paroxysmal Fibrillation  . Colitis, ischemic     Hx of  . Raynaud phenomenon   . Herpes zoster ophthalmicus   . Postherpetic neuralgia   . Iron deficiency   . Colon neoplasm     Family history of   Family History  Problem Relation Age of Onset  . Colon cancer Father   . Colon cancer Mother    History  Substance Use Topics  . Smoking status: Former Research scientist (life sciences)  . Smokeless tobacco: Never Used     Comment: Tobacco abuse for 65 years, one pack per day,  stopped in January 2010.   Marland Kitchen Alcohol Use: Yes     Comment: 2 alcoholic beverages per day.   Current Outpatient Prescriptions  Medication Sig Dispense Refill  . ALPRAZolam (XANAX) 0.25 MG tablet Take 0.25 mg by mouth every 8 (eight) hours as needed for sleep.      Marland Kitchen amLODipine (NORVASC) 5 MG tablet Take 1 tablet (5 mg total) by mouth 2 (two) times daily.  30 tablet  11  . aspirin 81 MG EC tablet Take 81 mg by mouth daily.        Marland Kitchen docusate sodium (COLACE) 100 MG capsule Take 100 mg by mouth as needed.       . nitroGLYCERIN (NITROSTAT) 0.4 MG SL tablet Place 0.4 mg under the tongue as needed.        . warfarin (COUMADIN) 2.5 MG tablet Take 2.5-3.75 mg by mouth See admin instructions. Monday, Wednesday, Friday and Sunday take 1 1/2 tablets. On Tuesday Thursday and Saturday take 1 tablet       No current facility-administered medications for this visit.   Allergies  Allergen Reactions  . Beta Adrenergic Blockers   . Cardizem [Diltiazem Hcl]   . Clopidogrel Bisulfate     REACTION: naseau  . Codeine   . Statins     Review of Systems: All systems reviewed and negative except where noted in HPI.  Physical Exam: BP 160/88  Pulse 78  Ht 5' 2.5" (1.588 m)  Wt 115 lb (52.164 kg)  BMI 20.69 kg/m2 Constitutional: Pleasant,well-developed, white female in no acute distress. HEENT: Normocephalic and atraumatic. Conjunctivae are normal. No scleral icterus.No oral candidiasis Neck supple.  Cardiovascular: Normal rate, regular rhythm.  Pulmonary/chest: Effort normal and breath sounds normal. No wheezing, rales or rhonchi. Abdominal: Soft, nondistended, nontender. Bowel sounds active throughout. There are no masses palpable. No hepatomegaly. Extremities: no edema Lymphadenopathy: No cervical adenopathy noted. Neurological: Alert and oriented to person place and time. Skin: Skin is warm and dry. No rashes noted. Psychiatric: Normal mood and affect. Behavior is normal.   ASSESSMENT AND  PLAN:  80 year old female with recent onset of dysphagia. She has been having reflux symptoms at night. Dysphagia may be secondary to esophagitis. Stricture not excluded. No recent antibiotics or steroids to suggest  candida. We discussed evaluation and treatment options. Patient would like to try Omeprazole 20mg  daily for now. If no improvement in a couple of weeks she is agreeable to barium swallow. EGD only for abnormal barium given her advanced age and chronic coumadin use. Patient or daughter will call us with condition update. In the interim patient will follow dysphagia precautions such as eating slowly, taking small bites of food, chewing well and consuming adequate amounts of fluid in between bites to avoid food impaction.

## 2013-11-22 NOTE — Progress Notes (Signed)
Reviewed, long time smoker, hx ischemic colitis 2008, GERD,suspect stricture . She will follow up with me.

## 2013-12-01 ENCOUNTER — Telehealth: Payer: Self-pay | Admitting: *Deleted

## 2013-12-01 NOTE — Telephone Encounter (Signed)
Message copied by Hulan Saas on Mon Dec 01, 2013 12:26 PM ------      Message from: Willia Craze      Created: Mon Dec 01, 2013 12:07 PM       [name], can you please call and see how patient is doing? Thanks.  ------

## 2013-12-01 NOTE — Telephone Encounter (Signed)
Left a message for patient to call me with update and how she is doing.

## 2013-12-02 NOTE — Telephone Encounter (Signed)
Left a message for patient to call me. 

## 2013-12-03 NOTE — Telephone Encounter (Signed)
Spoke with patient and she is doing fine. No problems.

## 2013-12-11 DIAGNOSIS — I4891 Unspecified atrial fibrillation: Secondary | ICD-10-CM | POA: Diagnosis not present

## 2013-12-11 DIAGNOSIS — Z7901 Long term (current) use of anticoagulants: Secondary | ICD-10-CM | POA: Diagnosis not present

## 2013-12-25 DIAGNOSIS — Z7901 Long term (current) use of anticoagulants: Secondary | ICD-10-CM | POA: Diagnosis not present

## 2013-12-25 DIAGNOSIS — I4891 Unspecified atrial fibrillation: Secondary | ICD-10-CM | POA: Diagnosis not present

## 2013-12-26 ENCOUNTER — Telehealth: Payer: Self-pay | Admitting: Cardiovascular Disease

## 2013-12-26 DIAGNOSIS — I739 Peripheral vascular disease, unspecified: Secondary | ICD-10-CM

## 2013-12-26 DIAGNOSIS — I779 Disorder of arteries and arterioles, unspecified: Secondary | ICD-10-CM

## 2013-12-26 NOTE — Telephone Encounter (Signed)
Pt due for carotid doppler end of April 2015 and abi's end of April 2015.  I spoke with pt and told her I would have [employer] department contact her to schedule these.

## 2013-12-26 NOTE — Telephone Encounter (Signed)
New Problem:  Pt is calling to schedule a carotid and lower venous doppler... Pt has no orders for either. Pt would like a call back from the nurse.

## 2014-01-16 NOTE — Telephone Encounter (Signed)
Carotid doppler and OV have been scheduled for 03/16/14.  Message has been left to call office to schedule abi's

## 2014-01-22 DIAGNOSIS — Z7901 Long term (current) use of anticoagulants: Secondary | ICD-10-CM | POA: Diagnosis not present

## 2014-01-22 DIAGNOSIS — I4891 Unspecified atrial fibrillation: Secondary | ICD-10-CM | POA: Diagnosis not present

## 2014-02-24 DIAGNOSIS — Z7901 Long term (current) use of anticoagulants: Secondary | ICD-10-CM | POA: Diagnosis not present

## 2014-02-24 DIAGNOSIS — I4891 Unspecified atrial fibrillation: Secondary | ICD-10-CM | POA: Diagnosis not present

## 2014-03-13 ENCOUNTER — Encounter: Payer: Self-pay | Admitting: Podiatrist

## 2014-03-13 ENCOUNTER — Ambulatory Visit (INDEPENDENT_AMBULATORY_CARE_PROVIDER_SITE_OTHER): Payer: Medicare Other | Admitting: Podiatrist

## 2014-03-13 ENCOUNTER — Ambulatory Visit (INDEPENDENT_AMBULATORY_CARE_PROVIDER_SITE_OTHER): Payer: Medicare Other

## 2014-03-13 VITALS — BP 164/85 | HR 69 | Resp 18

## 2014-03-13 DIAGNOSIS — M722 Plantar fascial fibromatosis: Secondary | ICD-10-CM

## 2014-03-13 DIAGNOSIS — I6529 Occlusion and stenosis of unspecified carotid artery: Secondary | ICD-10-CM | POA: Diagnosis not present

## 2014-03-13 MED ORDER — DICLOFENAC SODIUM 1 % TD GEL: 2.0000 g | Freq: Four times a day (QID) | TRANSDERMAL | Status: DC

## 2014-03-13 NOTE — Patient Instructions (Signed)

## 2014-03-13 NOTE — Progress Notes (Signed)
   Subjective:    Patient ID: [name], female    DOB: 1928/04/26, 80 y.o.   MRN: [id number]  HPI my left heel has been hurting since christmas and I had it before 7 years ago and painful now and sore and tender and hurts on bottom and hurts first thing in the morning and has tried over the counter stuff    Review of Systems  All other systems reviewed and are negative.      Objective:   Physical Exam GENERAL APPEARANCE: Alert, conversant. Appropriately groomed. No acute distress.  VASCULAR: purple appearance the bilateral heels is present.  Pedal pulses palpable at 2/4 DP and PT bilateral.  Capillary refill time is immediate to all digits,  Proximal to distal cooling it warm to warm.   NEUROLOGIC: sensation is intact epicritically and protectively to 5.07 monofilament at 5/5 sites bilateral.  Light touch is intact bilateral, vibratory sensation intact bilateral, achilles tendon reflex is intact bilateral.  MUSCULOSKELETAL: pain on palpation central and outside of the left heel.  acceptable muscle strength, tone and stability bilateral.  Intrinsic muscluature intact bilateral.    DERMATOLOGIC: skin color, texture, and turger are within normal limits.  No preulcerative lesions are seen, no interdigital maceration noted.  No open lesions present.  Digital nails are symptomatic 1,2 right and 1 left.     Assessment & Plan:  Bursitis, plantar fasciitis Plan:  Recommended an injection however she would like to get it OK'd with Dr. Arelia Sneddon prior to proceeding.  Recommended shoe gear changes, padding, stretches, voltaren gel.  Will see her back as needed or if she would like to get an injection.  Also trimmed her toenails 1,2 right 1 left

## 2014-03-16 ENCOUNTER — Ambulatory Visit (HOSPITAL_COMMUNITY): Payer: Medicare Other | Attending: Cardiovascular Disease | Admitting: Cardiology

## 2014-03-16 ENCOUNTER — Ambulatory Visit (INDEPENDENT_AMBULATORY_CARE_PROVIDER_SITE_OTHER): Payer: Medicare Other | Admitting: Cardiology

## 2014-03-16 ENCOUNTER — Ambulatory Visit (INDEPENDENT_AMBULATORY_CARE_PROVIDER_SITE_OTHER): Payer: Medicare Other | Admitting: Cardiovascular Disease

## 2014-03-16 ENCOUNTER — Encounter: Payer: Self-pay | Admitting: Cardiovascular Disease

## 2014-03-16 VITALS — BP 162/60 | HR 64 | Ht 62.5 in | Wt 110.8 lb

## 2014-03-16 DIAGNOSIS — J449 Chronic obstructive pulmonary disease, unspecified: Secondary | ICD-10-CM | POA: Diagnosis not present

## 2014-03-16 DIAGNOSIS — I739 Peripheral vascular disease, unspecified: Secondary | ICD-10-CM | POA: Insufficient documentation

## 2014-03-16 DIAGNOSIS — I10 Essential (primary) hypertension: Secondary | ICD-10-CM | POA: Insufficient documentation

## 2014-03-16 DIAGNOSIS — I4891 Unspecified atrial fibrillation: Secondary | ICD-10-CM

## 2014-03-16 DIAGNOSIS — I6529 Occlusion and stenosis of unspecified carotid artery: Secondary | ICD-10-CM | POA: Insufficient documentation

## 2014-03-16 DIAGNOSIS — I779 Disorder of arteries and arterioles, unspecified: Secondary | ICD-10-CM

## 2014-03-16 DIAGNOSIS — Z87891 Personal history of nicotine dependence: Secondary | ICD-10-CM | POA: Diagnosis not present

## 2014-03-16 DIAGNOSIS — I2510 Atherosclerotic heart disease of native coronary artery without angina pectoris: Secondary | ICD-10-CM | POA: Diagnosis not present

## 2014-03-16 DIAGNOSIS — I658 Occlusion and stenosis of other precerebral arteries: Secondary | ICD-10-CM | POA: Diagnosis not present

## 2014-03-16 DIAGNOSIS — E785 Hyperlipidemia, unspecified: Secondary | ICD-10-CM | POA: Diagnosis not present

## 2014-03-16 DIAGNOSIS — Z951 Presence of aortocoronary bypass graft: Secondary | ICD-10-CM | POA: Insufficient documentation

## 2014-03-16 DIAGNOSIS — J4489 Other specified chronic obstructive pulmonary disease: Secondary | ICD-10-CM | POA: Insufficient documentation

## 2014-03-16 NOTE — Progress Notes (Signed)
Carotid duplex complete 

## 2014-03-16 NOTE — Progress Notes (Signed)
ABI complete 

## 2014-03-16 NOTE — Progress Notes (Signed)
History of Present Illness: 80 yo WF with history of PAD, CAD s/p CABG 1/10, HTN, hyperlipidemia, former tobacco abuse, paroxysmal atrial fibrillation and COPD who is here today for PV and cardiac follow up. I initially saw her in June of 2010 for PV workup. Her cardiac issues have been followed by Dr. Olevia Perches in the past. She has done well with conservative management of her PAD. ABI today (Mar 16, 2014) stable at 0.65 on right and 0.94 on left. She has not tolerated statins or beta blockers in the past. She was admitted to [facility] in September 2012 with c/o chest pain. She ruled out for an MI and was discharged home. She wore a 21 day event monitor which showed normal sinus rhythm with no SVT, VT, atrial fibrillation.  Carotid artery dopplers today (Mar 16, 2014) with 0-39% RICA stenosis and 32-95% LICA stenosis, stable.  Incidental finding of echogenic vascularized structures in the bilateral thyroid in 2014. This has been addressed by Dr. Brigitte Pulse in primary care. She has refused biopsy of her thyroid. I saw her in October 2014 and she was c/o chest pains. I arranged a stress myoview but she cancelled the test.   She is here today for follow up. She has had no chest pains. No SOB. No leg pain, rest pain in the legs or LE ulcerations.   Primary Care Physician: Lang Snow   Last Lipid Profile: Followed in primary care.   Past Medical History  Diagnosis Date  . COPD (chronic obstructive pulmonary disease)   . Former smoker   . SOB (shortness of breath)     uncertain etiology  . CAD (coronary artery disease)     status post previous anterior wall myocardial infarction, treated with PTCA dx and subsequent non-ST- elevation myocardial infarction with recent bypass surgery, November 29, 2008.   Marland Kitchen HTN (hypertension)   . Hyperlipidemia   . PVD (peripheral vascular disease)     with claudication  . Arrhythmia 02/2009    Paroxysmal Fibrillation  . Colitis, ischemic     Hx of  . Raynaud phenomenon     . Herpes zoster ophthalmicus   . Postherpetic neuralgia   . Iron deficiency   . Colon neoplasm     Family history of    Past Surgical History  Procedure Laterality Date  . Coronary artery bypass graft      11/27/2008  . Appendectomy      Current Outpatient Prescriptions  Medication Sig Dispense Refill  . ALPRAZolam (XANAX) 0.25 MG tablet Take 0.25 mg by mouth every 8 (eight) hours as needed for sleep.      Marland Kitchen amLODipine (NORVASC) 5 MG tablet Take 1 tablet (5 mg total) by mouth 2 (two) times daily.  30 tablet  11  . aspirin 81 MG EC tablet Take 81 mg by mouth daily.        . diclofenac sodium (VOLTAREN) 1 % GEL Apply 2 g topically 4 (four) times daily. Rub into affected area of foot 2 to 4 times daily  100 g  2  . docusate sodium (COLACE) 100 MG capsule Take 100 mg by mouth as needed.       . nitroGLYCERIN (NITROSTAT) 0.4 MG SL tablet Place 0.4 mg under the tongue as needed.        . warfarin (COUMADIN) 2.5 MG tablet Take 2.5-3.75 mg by mouth See admin instructions. Monday, Wednesday, Friday and Sunday take 1 1/2 tablets. On Tuesday Thursday and Saturday  take 1 tablet       No current facility-administered medications for this visit.    Allergies  Allergen Reactions  . Beta Adrenergic Blockers   . Cardizem [Diltiazem Hcl]   . Clopidogrel Bisulfate     REACTION: naseau  . Codeine   . Statins     History   Social History  . Marital Status: Widowed    Spouse Name: N/A    Number of Children: N/A  . Years of Education: N/A   Occupational History  . Homemaker    Social History Main Topics  . Smoking status: Former Research scientist (life sciences)  . Smokeless tobacco: Never Used     Comment: Tobacco abuse for 65 years, one pack per day, stopped in January 2010.   Marland Kitchen Alcohol Use: Yes     Comment: 2 alcoholic beverages per day.  . Drug Use: No  . Sexual Activity: Not on file   Other Topics Concern  . Not on file   Social History Narrative  . No narrative on file    Family History   Problem Relation Age of Onset  . Colon cancer Father   . Colon cancer Mother     Review of Systems:  As stated in the HPI and otherwise negative.   BP 162/60  Pulse 64  Ht 5' 2.5" (1.588 m)  Wt 110 lb 12.8 oz (50.259 kg)  BMI 19.93 kg/m2  Physical Examination: General: Well developed, well nourished, NAD HEENT: OP clear, mucus membranes moist SKIN: warm, dry. No rashes. Neuro: No focal deficits Musculoskeletal: Muscle strength 5/5 all ext Psychiatric: Mood and affect normal Neck: No JVD, no carotid bruits, no thyromegaly, no lymphadenopathy. Lungs:Clear bilaterally, no wheezes, rhonci, crackles Cardiovascular: Regular rate and rhythm. No murmurs, gallops or rubs. Abdomen:Soft. Bowel sounds present. Non-tender.  Extremities: No lower extremity edema. Pulses are trace to 1 + in the bilateral DP/PT.  Assessment and Plan:   1. PAD:  Her LE dopplers are stable today. She has no pain in her legs with walking. No rest pain or ulcerations. Continue walking daily. She will alert [place] if there is a change in her clinical status. Repeat ABI May 2016  2. HYPERTENSION:  BP is elevated today but has been controlled per her home log. Will continue amlodipine. No changes today.     3. CAD:  Stable. She does not tolerate beta blockers or statins.     4. CAROTID ARTERY DISEASE:  Mild to moderate bilateral disease by dopplers today as above. Repeat carotid dopplers in one year.      5. ATRIAL FIBRILLATION: Maintaining sinus rhythm. Continue anti-coagulation with coumadin. This is followed in primary care.

## 2014-03-16 NOTE — Patient Instructions (Signed)
Your physician wants you to follow-up in:  12 months.  You will receive a reminder letter in the mail two months in advance. If you don't receive a letter, please call our office to schedule the follow-up appointment.   

## 2014-03-31 ENCOUNTER — Encounter: Payer: Self-pay | Admitting: Cardiovascular Disease

## 2014-03-31 DIAGNOSIS — E041 Nontoxic single thyroid nodule: Secondary | ICD-10-CM | POA: Diagnosis not present

## 2014-03-31 DIAGNOSIS — E785 Hyperlipidemia, unspecified: Secondary | ICD-10-CM | POA: Diagnosis not present

## 2014-03-31 DIAGNOSIS — R7301 Impaired fasting glucose: Secondary | ICD-10-CM | POA: Diagnosis not present

## 2014-03-31 DIAGNOSIS — M949 Disorder of cartilage, unspecified: Secondary | ICD-10-CM | POA: Diagnosis not present

## 2014-03-31 DIAGNOSIS — Z7901 Long term (current) use of anticoagulants: Secondary | ICD-10-CM | POA: Diagnosis not present

## 2014-03-31 DIAGNOSIS — M899 Disorder of bone, unspecified: Secondary | ICD-10-CM | POA: Diagnosis not present

## 2014-03-31 DIAGNOSIS — I509 Heart failure, unspecified: Secondary | ICD-10-CM | POA: Diagnosis not present

## 2014-03-31 DIAGNOSIS — R809 Proteinuria, unspecified: Secondary | ICD-10-CM | POA: Diagnosis not present

## 2014-03-31 DIAGNOSIS — R030 Elevated blood-pressure reading, without diagnosis of hypertension: Secondary | ICD-10-CM | POA: Diagnosis not present

## 2014-04-07 DIAGNOSIS — R030 Elevated blood-pressure reading, without diagnosis of hypertension: Secondary | ICD-10-CM | POA: Diagnosis not present

## 2014-04-07 DIAGNOSIS — I7389 Other specified peripheral vascular diseases: Secondary | ICD-10-CM | POA: Diagnosis not present

## 2014-04-07 DIAGNOSIS — I4891 Unspecified atrial fibrillation: Secondary | ICD-10-CM | POA: Diagnosis not present

## 2014-04-07 DIAGNOSIS — Z1331 Encounter for screening for depression: Secondary | ICD-10-CM | POA: Diagnosis not present

## 2014-04-07 DIAGNOSIS — Z7901 Long term (current) use of anticoagulants: Secondary | ICD-10-CM | POA: Diagnosis not present

## 2014-04-07 DIAGNOSIS — Z1212 Encounter for screening for malignant neoplasm of rectum: Secondary | ICD-10-CM | POA: Diagnosis not present

## 2014-04-07 DIAGNOSIS — I509 Heart failure, unspecified: Secondary | ICD-10-CM | POA: Diagnosis not present

## 2014-04-07 DIAGNOSIS — Z0000 Encounter for general adult medical examination without abnormal findings: Secondary | ICD-10-CM | POA: Diagnosis not present

## 2014-04-07 DIAGNOSIS — I2510 Atherosclerotic heart disease of native coronary artery without angina pectoris: Secondary | ICD-10-CM | POA: Diagnosis not present

## 2014-04-07 DIAGNOSIS — E785 Hyperlipidemia, unspecified: Secondary | ICD-10-CM | POA: Diagnosis not present

## 2014-04-14 DIAGNOSIS — I4891 Unspecified atrial fibrillation: Secondary | ICD-10-CM | POA: Diagnosis not present

## 2014-04-14 DIAGNOSIS — Z7901 Long term (current) use of anticoagulants: Secondary | ICD-10-CM | POA: Diagnosis not present

## 2014-05-14 DIAGNOSIS — Z7901 Long term (current) use of anticoagulants: Secondary | ICD-10-CM | POA: Diagnosis not present

## 2014-05-14 DIAGNOSIS — I4891 Unspecified atrial fibrillation: Secondary | ICD-10-CM | POA: Diagnosis not present

## 2014-06-18 DIAGNOSIS — I4891 Unspecified atrial fibrillation: Secondary | ICD-10-CM | POA: Diagnosis not present

## 2014-06-18 DIAGNOSIS — Z7901 Long term (current) use of anticoagulants: Secondary | ICD-10-CM | POA: Diagnosis not present

## 2014-08-04 DIAGNOSIS — Z7901 Long term (current) use of anticoagulants: Secondary | ICD-10-CM | POA: Diagnosis not present

## 2014-08-04 DIAGNOSIS — I4891 Unspecified atrial fibrillation: Secondary | ICD-10-CM | POA: Diagnosis not present

## 2014-09-09 DIAGNOSIS — I480 Paroxysmal atrial fibrillation: Secondary | ICD-10-CM | POA: Diagnosis not present

## 2014-09-09 DIAGNOSIS — Z7901 Long term (current) use of anticoagulants: Secondary | ICD-10-CM | POA: Diagnosis not present

## 2014-10-15 DIAGNOSIS — Z7901 Long term (current) use of anticoagulants: Secondary | ICD-10-CM | POA: Diagnosis not present

## 2014-10-15 DIAGNOSIS — I480 Paroxysmal atrial fibrillation: Secondary | ICD-10-CM | POA: Diagnosis not present

## 2014-10-26 IMAGING — US US SOFT TISSUE HEAD/NECK
1 series · 14 of 25 positions shown · non-contrast
Comparison: No comparison studies available.

CLINICAL DATA: Thyroid nodule

THYROID ULTRASOUND
TECHNIQUE: Ultrasound examination of the thyroid gland and
adjacent soft tissues was performed.

[Series 1: us soft tissue head/neck · 0.08mm/px · 14 of 55 slices shown]
[im 1/55]
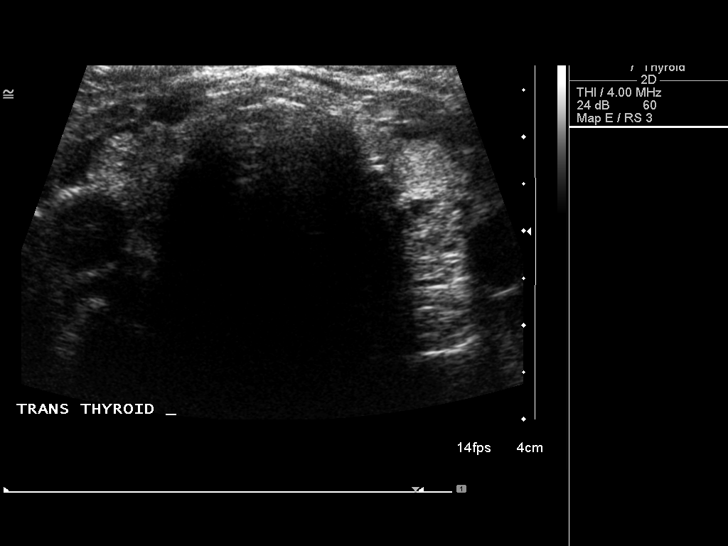
[im 5/55]
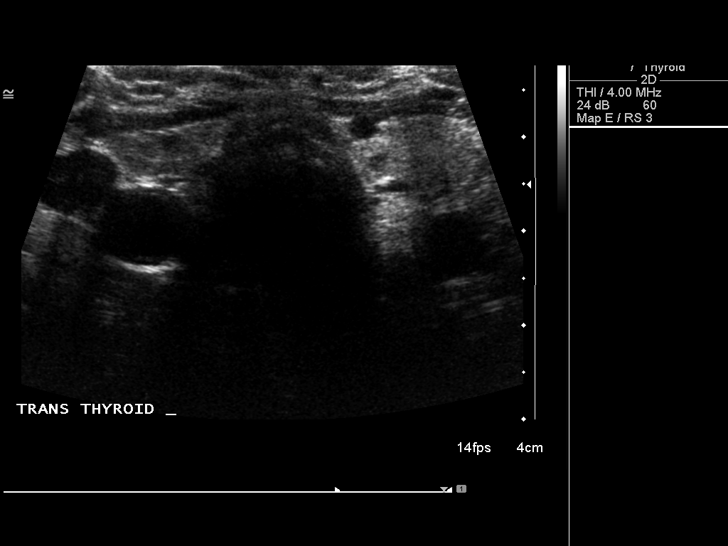
[im 10/55]
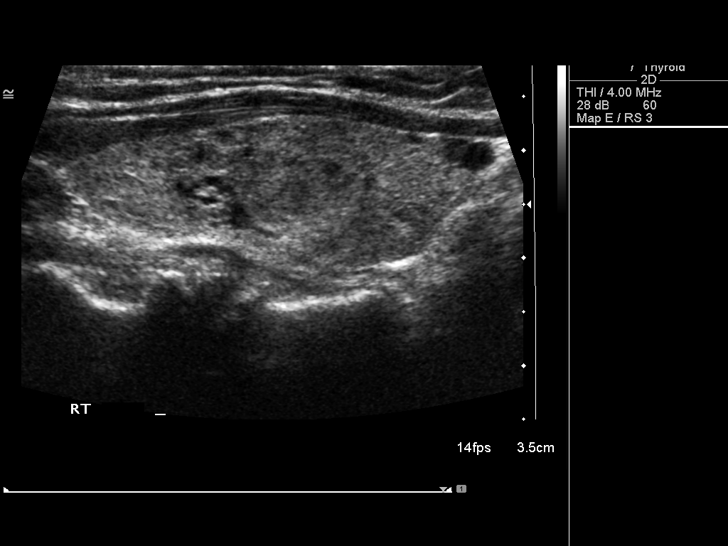
[im 14/55]
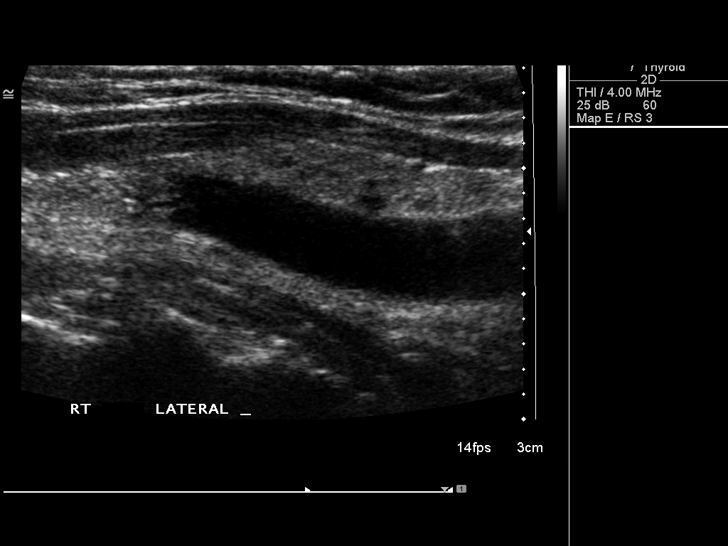
[im 19/55]
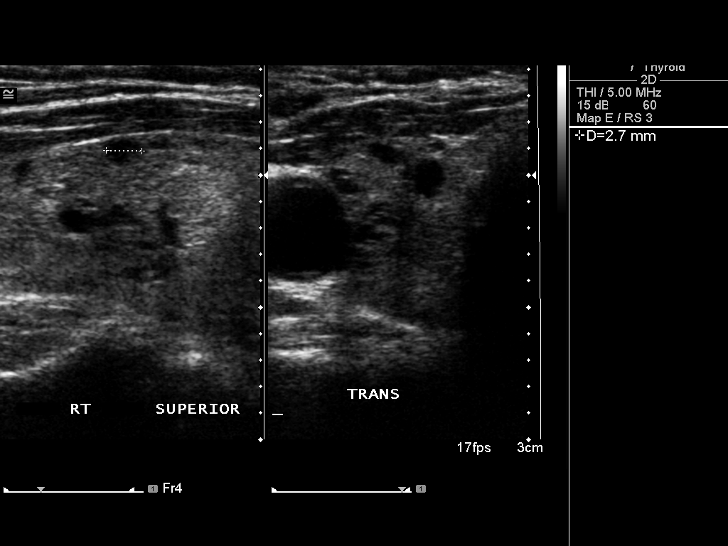
[im 21/55]
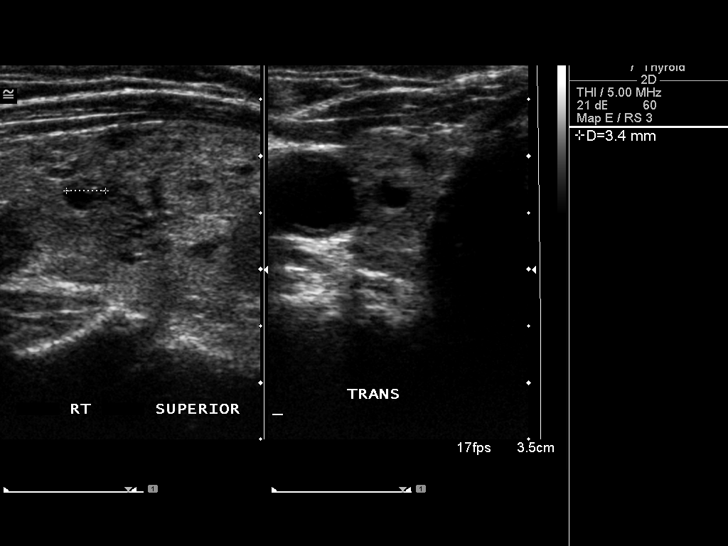
[im 25/55]
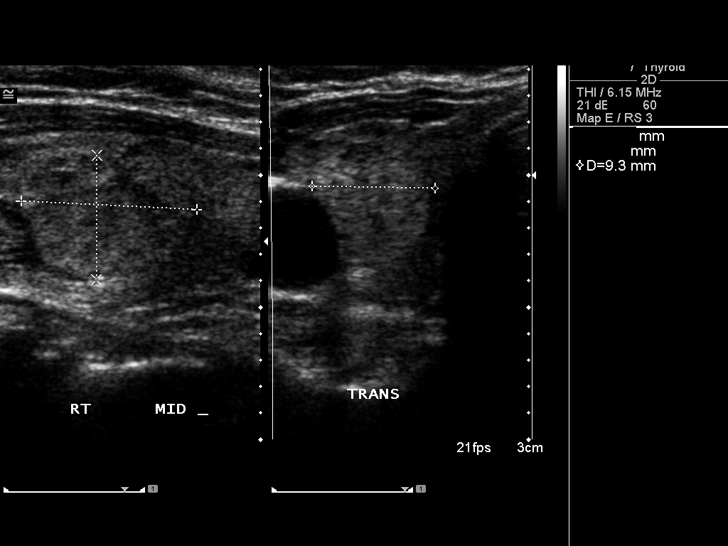
[im 30/55]
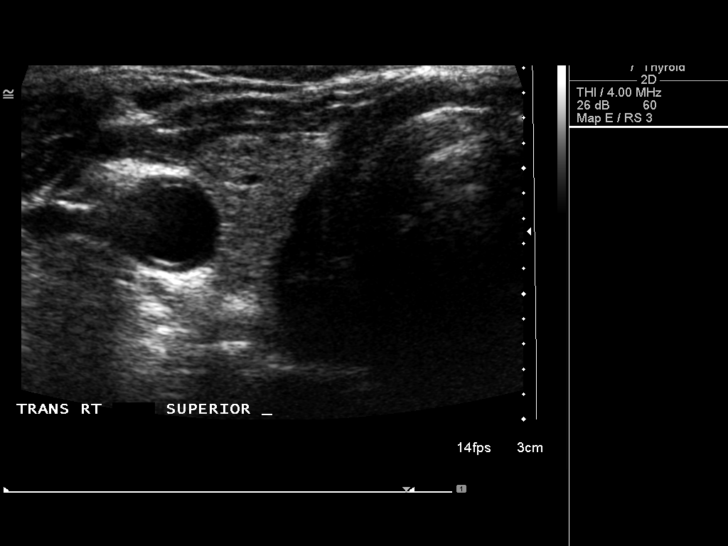
[im 34/55]
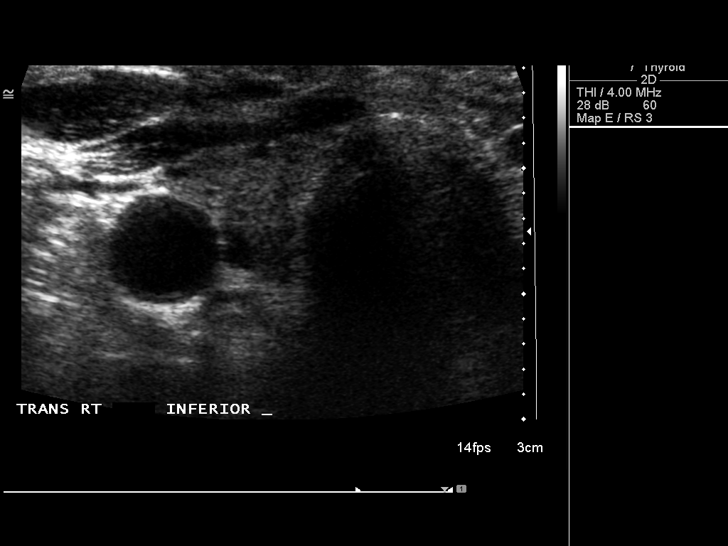
[im 37/55]
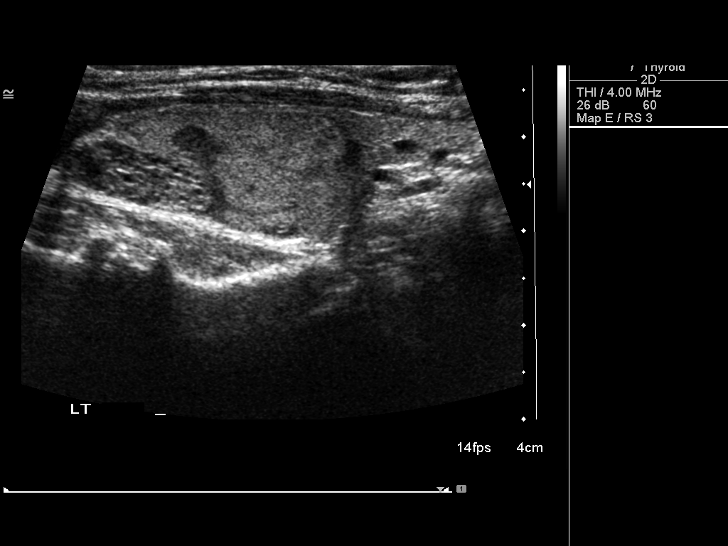
[im 41/55]
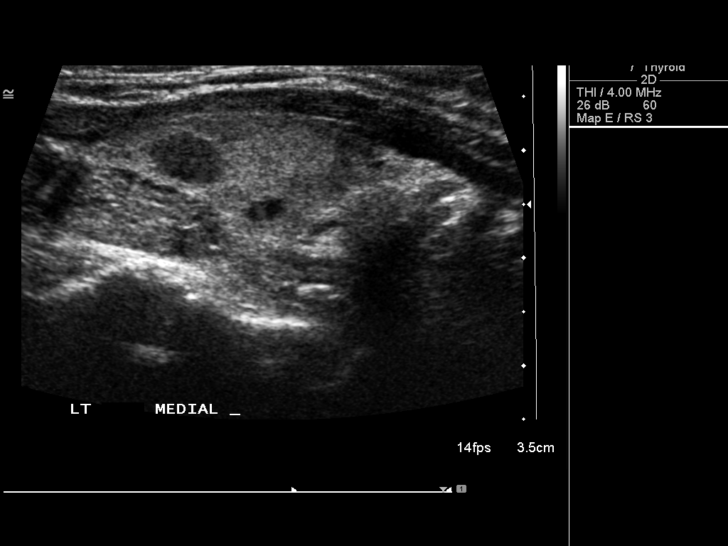
[im 46/55]
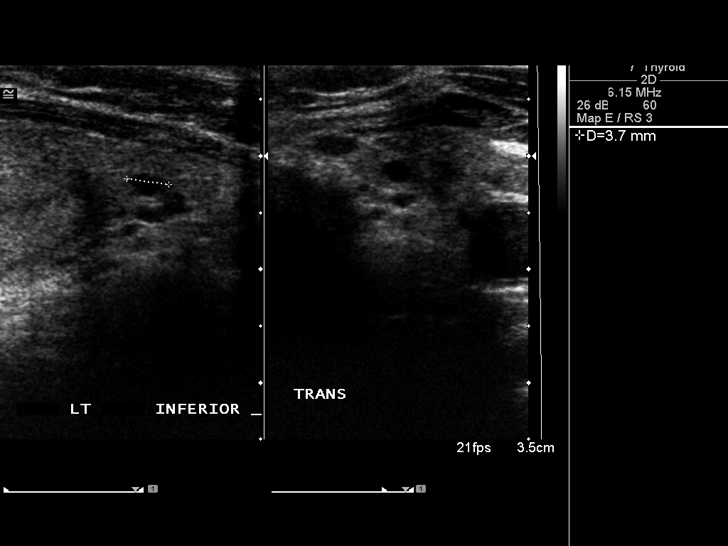
[im 50/55]
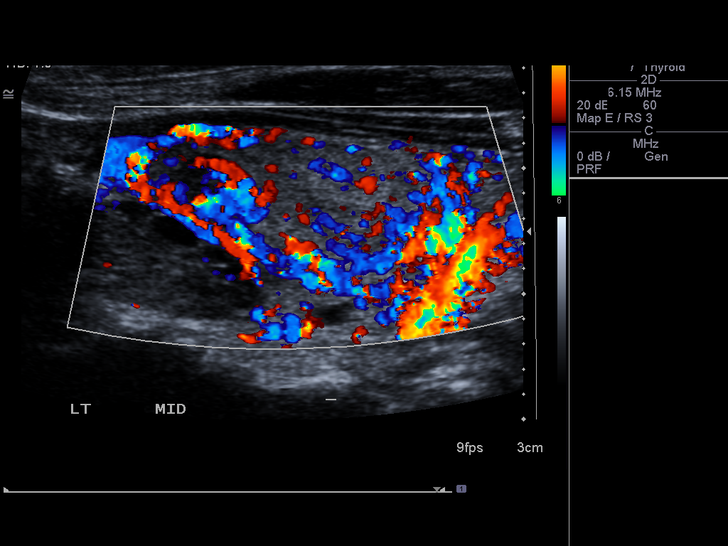
[im 55/55]
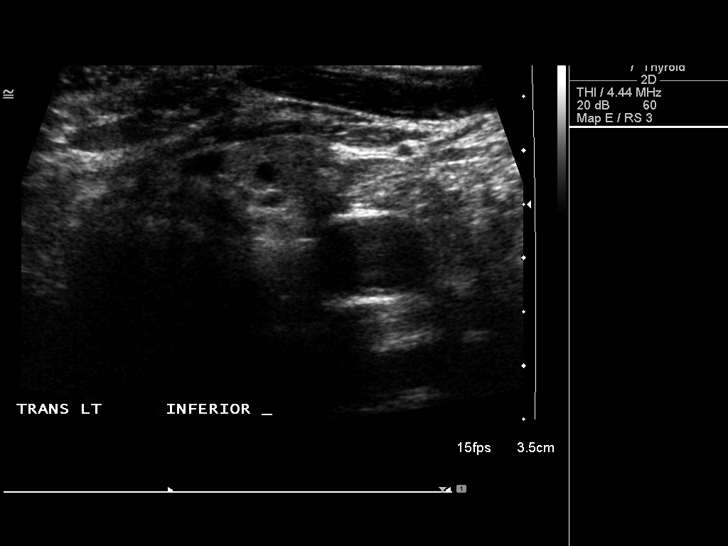

[14 of 25 positions shown; findings below may reference images not displayed]

FINDINGS: The right thyroid lobe measures 3.9 x 1.4 x 1.4 cm.  The left lobe
measures 4.5 x 1.6 x 1.9 cm.  The isthmus is 1 mm in thickness.

Thyroid parenchyma is heterogeneous.  Multiple nodules are
identified in each lobe of the thyroid gland.  Most of these are
small, measuring in the 5-6 mm size range.  A dominant nodule in
the mid right lobe measures 1.3 x 0.9 x 0.9 cm has solid features
without associated microcalcification or hypoechoic halo.

On the left, the dominant 2.8 x 1.5 x 1.7 cm solid nodule is
identified.  No internal microcalcification.  A smaller 1.6 cm
mixed cystic and solid nodule is identified in the posterior aspect
of the upper pole
IMPRESSION: Dominant left-sided solid thyroid nodules meet criteria for
recommendation of tissue sampling. This recommendation follows the
consensus statement:  Management of Thyroid Nodules Detected as US:
Society of Radiologists in Ultrasound Consensus Conference

## 2014-11-25 DIAGNOSIS — Z7901 Long term (current) use of anticoagulants: Secondary | ICD-10-CM | POA: Diagnosis not present

## 2014-11-25 DIAGNOSIS — I480 Paroxysmal atrial fibrillation: Secondary | ICD-10-CM | POA: Diagnosis not present

## 2014-12-17 ENCOUNTER — Other Ambulatory Visit: Payer: Self-pay | Admitting: Radiology

## 2014-12-17 DIAGNOSIS — I6523 Occlusion and stenosis of bilateral carotid arteries: Secondary | ICD-10-CM

## 2014-12-30 DIAGNOSIS — Z7901 Long term (current) use of anticoagulants: Secondary | ICD-10-CM | POA: Diagnosis not present

## 2014-12-30 DIAGNOSIS — I480 Paroxysmal atrial fibrillation: Secondary | ICD-10-CM | POA: Diagnosis not present

## 2015-01-28 DIAGNOSIS — Z7901 Long term (current) use of anticoagulants: Secondary | ICD-10-CM | POA: Diagnosis not present

## 2015-01-28 DIAGNOSIS — I480 Paroxysmal atrial fibrillation: Secondary | ICD-10-CM | POA: Diagnosis not present

## 2015-02-02 ENCOUNTER — Other Ambulatory Visit: Payer: Self-pay | Admitting: Radiology

## 2015-02-02 DIAGNOSIS — I739 Peripheral vascular disease, unspecified: Secondary | ICD-10-CM

## 2015-03-09 DIAGNOSIS — Z7901 Long term (current) use of anticoagulants: Secondary | ICD-10-CM | POA: Diagnosis not present

## 2015-03-09 DIAGNOSIS — I480 Paroxysmal atrial fibrillation: Secondary | ICD-10-CM | POA: Diagnosis not present

## 2015-03-23 ENCOUNTER — Ambulatory Visit (INDEPENDENT_AMBULATORY_CARE_PROVIDER_SITE_OTHER): Payer: Medicare Other | Admitting: Cardiovascular Disease

## 2015-03-23 ENCOUNTER — Ambulatory Visit (HOSPITAL_BASED_OUTPATIENT_CLINIC_OR_DEPARTMENT_OTHER): Payer: Medicare Other

## 2015-03-23 ENCOUNTER — Encounter: Payer: Self-pay | Admitting: Cardiovascular Disease

## 2015-03-23 ENCOUNTER — Ambulatory Visit (HOSPITAL_COMMUNITY): Payer: Medicare Other | Attending: Cardiovascular Disease

## 2015-03-23 VITALS — BP 156/70 | HR 68 | Ht 63.0 in | Wt 112.8 lb

## 2015-03-23 DIAGNOSIS — I480 Paroxysmal atrial fibrillation: Secondary | ICD-10-CM | POA: Diagnosis not present

## 2015-03-23 DIAGNOSIS — J449 Chronic obstructive pulmonary disease, unspecified: Secondary | ICD-10-CM | POA: Insufficient documentation

## 2015-03-23 DIAGNOSIS — I10 Essential (primary) hypertension: Secondary | ICD-10-CM | POA: Insufficient documentation

## 2015-03-23 DIAGNOSIS — I2510 Atherosclerotic heart disease of native coronary artery without angina pectoris: Secondary | ICD-10-CM | POA: Insufficient documentation

## 2015-03-23 DIAGNOSIS — Z951 Presence of aortocoronary bypass graft: Secondary | ICD-10-CM | POA: Insufficient documentation

## 2015-03-23 DIAGNOSIS — I779 Disorder of arteries and arterioles, unspecified: Secondary | ICD-10-CM | POA: Diagnosis not present

## 2015-03-23 DIAGNOSIS — I6523 Occlusion and stenosis of bilateral carotid arteries: Secondary | ICD-10-CM

## 2015-03-23 DIAGNOSIS — I739 Peripheral vascular disease, unspecified: Secondary | ICD-10-CM | POA: Diagnosis not present

## 2015-03-23 DIAGNOSIS — E785 Hyperlipidemia, unspecified: Secondary | ICD-10-CM | POA: Diagnosis not present

## 2015-03-23 DIAGNOSIS — Z87891 Personal history of nicotine dependence: Secondary | ICD-10-CM | POA: Insufficient documentation

## 2015-03-23 NOTE — Patient Instructions (Signed)
Medication Instructions:  Your physician recommends that you continue on your current medications as directed. Please refer to the Current Medication list given to you today.   Labwork: None  Testing/Procedures: Your physician has requested that you have a carotid duplex. This test is an ultrasound of the carotid arteries in your neck. It looks at blood flow through these arteries that supply the brain with blood. Allow one hour for this exam. There are no restrictions or special instructions. To be done in 12 months  Your physician has requested that you have an ankle brachial index (ABI). During this test an ultrasound and blood pressure cuff are used to evaluate the arteries that supply the arms and legs with blood. Allow thirty minutes for this exam. There are no restrictions or special instructions. To be done in 12 months.     Follow-Up:  Your physician wants you to follow-up in: 12 months.  You will receive a reminder letter in the mail two months in advance. If you don't receive a letter, please call our office to schedule the follow-up appointment.

## 2015-03-23 NOTE — Progress Notes (Signed)
Chief Complaint  Patient presents with  . Chest Pain    History of Present Illness: 81 yo WF with history of PAD, CAD s/p CABG 1/10, HTN, hyperlipidemia, former tobacco abuse, paroxysmal atrial fibrillation and COPD who is here today for PV and cardiac follow up. I initially saw her in June of 2010 for PV workup. Her cardiac issues have been followed by Dr. Olevia Perches in the past. She has done well with conservative management of her PAD. She has not tolerated statins or beta blockers in the past. She was admitted to [facility] in September 2012 with c/o chest pain. She ruled out for an MI and was discharged home. She wore a 21 day event monitor which showed normal sinus rhythm with no SVT, VT, atrial fibrillation.    Incidental finding of echogenic vascularized structures in the bilateral thyroid in 2014. This has been addressed by Dr. Brigitte Pulse in primary care. She has refused biopsy of her thyroid. I saw her in October 2014 and she was c/o chest pains. I arranged a stress myoview but she cancelled the test. ABI today 0.52 on the right and 0.91 on the left. Carotid artery dopplers today with 0-39% RICA stenosis and 25-95% LICA stenosis, stable.  She is here today for follow up. She has had no exertional chest pains. No SOB. No leg pain, rest pain in the legs or LE ulcerations. She did fall one month ago, tripping on a gumball. She cut her hands and hit her head. Her chest wall has been sore since. Worse with movement.   Primary Care Physician: Lang Snow   Last Lipid Profile: Followed in primary care.   Past Medical History  Diagnosis Date  . COPD (chronic obstructive pulmonary disease)   . Former smoker   . SOB (shortness of breath)     uncertain etiology  . CAD (coronary artery disease)     status post previous anterior wall myocardial infarction, treated with PTCA dx and subsequent non-ST- elevation myocardial infarction with recent bypass surgery, November 29, 2008.   Marland Kitchen HTN (hypertension)   .  Hyperlipidemia   . PVD (peripheral vascular disease)     with claudication  . Arrhythmia 02/2009    Paroxysmal Fibrillation  . Colitis, ischemic     Hx of  . Raynaud phenomenon   . Herpes zoster ophthalmicus   . Postherpetic neuralgia   . Iron deficiency   . Colon neoplasm     Family history of    Past Surgical History  Procedure Laterality Date  . Coronary artery bypass graft      11/27/2008  . Appendectomy      Current Outpatient Prescriptions  Medication Sig Dispense Refill  . ALPRAZolam (XANAX) 0.25 MG tablet Take 0.25 mg by mouth every 8 (eight) hours as needed for sleep.    Marland Kitchen amLODipine (NORVASC) 5 MG tablet Take 1 tablet (5 mg total) by mouth 2 (two) times daily. 30 tablet 11  . aspirin 81 MG EC tablet Take 81 mg by mouth daily.      . diclofenac sodium (VOLTAREN) 1 % GEL Apply 2 g topically 4 (four) times daily. Rub into affected area of foot 2 to 4 times daily 100 g 2  . docusate sodium (COLACE) 100 MG capsule Take 100 mg by mouth as needed (for constipation).     . nitroGLYCERIN (NITROSTAT) 0.4 MG SL tablet Place 0.4 mg under the tongue as needed (for chest pain).     Marland Kitchen warfarin (  COUMADIN) 2.5 MG tablet Take 2.5-3.75 mg by mouth See admin instructions. Monday, Wednesday, Friday and Sunday take 1 1/2 tablets. On Tuesday Thursday and Saturday take 1 tablet     No current facility-administered medications for this visit.    Allergies  Allergen Reactions  . Beta Adrenergic Blockers   . Cardizem [Diltiazem Hcl]   . Clopidogrel Bisulfate     REACTION: naseau  . Codeine   . Statins     History   Social History  . Marital Status: Widowed    Spouse Name: N/A  . Number of Children: N/A  . Years of Education: N/A   Occupational History  . Homemaker    Social History Main Topics  . Smoking status: Former Research scientist (life sciences)  . Smokeless tobacco: Never Used     Comment: Tobacco abuse for 65 years, one pack per day, stopped in January 2010.   Marland Kitchen Alcohol Use: Yes     Comment:  2 alcoholic beverages per day.  . Drug Use: No  . Sexual Activity: Not on file   Other Topics Concern  . Not on file   Social History Narrative    Family History  Problem Relation Age of Onset  . Colon cancer Father   . Colon cancer Mother     Review of Systems:  As stated in the HPI and otherwise negative.   BP 156/70 mmHg  Pulse 68  Ht 5\' 3"  (1.6 m)  Wt 112 lb 12.8 oz (51.166 kg)  BMI 19.99 kg/m2  Physical Examination: General: Well developed, well nourished, NAD HEENT: OP clear, mucus membranes moist SKIN: warm, dry. No rashes. Neuro: No focal deficits Musculoskeletal: Muscle strength 5/5 all ext Psychiatric: Mood and affect normal Neck: No JVD, no carotid bruits, no thyromegaly, no lymphadenopathy. Lungs:Clear bilaterally, no wheezes, rhonci, crackles Cardiovascular: Regular rate and rhythm. No murmurs, gallops or rubs. Abdomen:Soft. Bowel sounds present. Non-tender.  Extremities: No lower extremity edema. Pulses are trace to 1 + in the bilateral DP/PT.  EKG:  EKG is ordered today. The ekg ordered today demonstrates NSR, rate 68 bpm. Lateral Q waves.   Recent Labs: No results found for requested labs within last 365 days.   Lipid Panel    Component Value Date/Time   CHOL * 08/23/2010 0420    230        ATP III CLASSIFICATION:  <200     mg/dL   Desirable  200-239  mg/dL   Borderline High  >=240    mg/dL   High          TRIG 71 08/23/2010 0420   HDL 90 08/23/2010 0420   CHOLHDL 2.6 08/23/2010 0420   VLDL 14 08/23/2010 0420   LDLCALC * 08/23/2010 0420    126        Total Cholesterol/HDL:CHD Risk Coronary Heart Disease Risk Table                     Men   Women  1/2 Average Risk   3.4   3.3  Average Risk       5.0   4.4  2 X Average Risk   9.6   7.1  3 X Average Risk  23.4   11.0        Use the calculated Patient Ratio above and the CHD Risk Table to determine the patient's CHD Risk.        ATP III CLASSIFICATION (LDL):  <100     mg/dL  Optimal  100-129  mg/dL   Near or Above                    Optimal  130-159  mg/dL   Borderline  160-189  mg/dL   High  >190     mg/dL   Very High     Wt Readings from Last 3 Encounters:  03/23/15 112 lb 12.8 oz (51.166 kg)  03/16/14 110 lb 12.8 oz (50.259 kg)  11/20/13 115 lb (52.164 kg)     Other studies Reviewed: Additional studies/ records that were reviewed today include: . Review of the above records demonstrates:    Assessment and Plan:   1. PAD:  Her LE dopplers are stable today. She has no pain in her legs with walking. No rest pain or ulcerations. Continue walking daily. She will alert [place] if there is a change in her clinical status. Repeat ABI May 2017  2. HYPERTENSION:  BP is elevated today but has been controlled per her home log. Will continue amlodipine. No changes today.     3. CAD:  Stable. She does not tolerate beta blockers or statins.     4. CAROTID ARTERY DISEASE:  Mild to moderate bilateral disease by carotid dopplers today. Repeat carotid dopplers May 2017.   5. ATRIAL FIBRILLATION: Maintaining sinus rhythm. Continue anti-coagulation with coumadin. This is followed in primary care.   Current medicines are reviewed at length with the patient today.  The patient does not have concerns regarding medicines.  The following changes have been made:  no change  Labs/ tests ordered today include:   Orders Placed This Encounter  Procedures  . EKG 12-Lead    Disposition:   FU with me in 12  months  Signed, Lauree Chandler, MD 03/23/2015 2:26 PM    [address] [place], [place],   [address] Phone: [phone]; Fax: [phone]

## 2015-04-02 DIAGNOSIS — Z0000 Encounter for general adult medical examination without abnormal findings: Secondary | ICD-10-CM | POA: Diagnosis not present

## 2015-04-02 DIAGNOSIS — E785 Hyperlipidemia, unspecified: Secondary | ICD-10-CM | POA: Diagnosis not present

## 2015-04-02 DIAGNOSIS — E041 Nontoxic single thyroid nodule: Secondary | ICD-10-CM | POA: Diagnosis not present

## 2015-04-02 DIAGNOSIS — R7301 Impaired fasting glucose: Secondary | ICD-10-CM | POA: Diagnosis not present

## 2015-04-09 DIAGNOSIS — R030 Elevated blood-pressure reading, without diagnosis of hypertension: Secondary | ICD-10-CM | POA: Diagnosis not present

## 2015-04-09 DIAGNOSIS — Z7901 Long term (current) use of anticoagulants: Secondary | ICD-10-CM | POA: Diagnosis not present

## 2015-04-09 DIAGNOSIS — Z1389 Encounter for screening for other disorder: Secondary | ICD-10-CM | POA: Diagnosis not present

## 2015-04-09 DIAGNOSIS — Z955 Presence of coronary angioplasty implant and graft: Secondary | ICD-10-CM | POA: Diagnosis not present

## 2015-04-09 DIAGNOSIS — Z0000 Encounter for general adult medical examination without abnormal findings: Secondary | ICD-10-CM | POA: Diagnosis not present

## 2015-04-09 DIAGNOSIS — E785 Hyperlipidemia, unspecified: Secondary | ICD-10-CM | POA: Diagnosis not present

## 2015-04-09 DIAGNOSIS — I480 Paroxysmal atrial fibrillation: Secondary | ICD-10-CM | POA: Diagnosis not present

## 2015-04-09 DIAGNOSIS — I25810 Atherosclerosis of coronary artery bypass graft(s) without angina pectoris: Secondary | ICD-10-CM | POA: Diagnosis not present

## 2015-04-09 DIAGNOSIS — I509 Heart failure, unspecified: Secondary | ICD-10-CM | POA: Diagnosis not present

## 2015-04-15 ENCOUNTER — Encounter: Payer: Self-pay | Admitting: Cardiovascular Disease

## 2015-05-06 DIAGNOSIS — Z7901 Long term (current) use of anticoagulants: Secondary | ICD-10-CM | POA: Diagnosis not present

## 2015-05-06 DIAGNOSIS — I480 Paroxysmal atrial fibrillation: Secondary | ICD-10-CM | POA: Diagnosis not present

## 2015-06-09 DIAGNOSIS — I480 Paroxysmal atrial fibrillation: Secondary | ICD-10-CM | POA: Diagnosis not present

## 2015-06-09 DIAGNOSIS — Z7901 Long term (current) use of anticoagulants: Secondary | ICD-10-CM | POA: Diagnosis not present

## 2015-07-20 DIAGNOSIS — Z7901 Long term (current) use of anticoagulants: Secondary | ICD-10-CM | POA: Diagnosis not present

## 2015-07-20 DIAGNOSIS — I480 Paroxysmal atrial fibrillation: Secondary | ICD-10-CM | POA: Diagnosis not present

## 2015-08-19 DIAGNOSIS — I480 Paroxysmal atrial fibrillation: Secondary | ICD-10-CM | POA: Diagnosis not present

## 2015-08-19 DIAGNOSIS — Z7901 Long term (current) use of anticoagulants: Secondary | ICD-10-CM | POA: Diagnosis not present

## 2015-09-23 DIAGNOSIS — I480 Paroxysmal atrial fibrillation: Secondary | ICD-10-CM | POA: Diagnosis not present

## 2015-09-23 DIAGNOSIS — Z7901 Long term (current) use of anticoagulants: Secondary | ICD-10-CM | POA: Diagnosis not present

## 2015-10-15 DIAGNOSIS — R030 Elevated blood-pressure reading, without diagnosis of hypertension: Secondary | ICD-10-CM | POA: Diagnosis not present

## 2015-10-15 DIAGNOSIS — Z6820 Body mass index (BMI) 20.0-20.9, adult: Secondary | ICD-10-CM | POA: Diagnosis not present

## 2015-10-15 DIAGNOSIS — R7301 Impaired fasting glucose: Secondary | ICD-10-CM | POA: Diagnosis not present

## 2015-10-15 DIAGNOSIS — Z7901 Long term (current) use of anticoagulants: Secondary | ICD-10-CM | POA: Diagnosis not present

## 2015-10-15 DIAGNOSIS — I480 Paroxysmal atrial fibrillation: Secondary | ICD-10-CM | POA: Diagnosis not present

## 2015-10-15 DIAGNOSIS — E784 Other hyperlipidemia: Secondary | ICD-10-CM | POA: Diagnosis not present

## 2015-10-15 DIAGNOSIS — I25810 Atherosclerosis of coronary artery bypass graft(s) without angina pectoris: Secondary | ICD-10-CM | POA: Diagnosis not present

## 2015-10-21 DIAGNOSIS — Z7901 Long term (current) use of anticoagulants: Secondary | ICD-10-CM | POA: Diagnosis not present

## 2015-10-21 DIAGNOSIS — I480 Paroxysmal atrial fibrillation: Secondary | ICD-10-CM | POA: Diagnosis not present

## 2015-11-24 DIAGNOSIS — Z7901 Long term (current) use of anticoagulants: Secondary | ICD-10-CM | POA: Diagnosis not present

## 2015-11-24 DIAGNOSIS — I480 Paroxysmal atrial fibrillation: Secondary | ICD-10-CM | POA: Diagnosis not present

## 2015-12-28 DIAGNOSIS — I480 Paroxysmal atrial fibrillation: Secondary | ICD-10-CM | POA: Diagnosis not present

## 2015-12-28 DIAGNOSIS — Z7901 Long term (current) use of anticoagulants: Secondary | ICD-10-CM | POA: Diagnosis not present

## 2016-02-01 DIAGNOSIS — I480 Paroxysmal atrial fibrillation: Secondary | ICD-10-CM | POA: Diagnosis not present

## 2016-02-01 DIAGNOSIS — Z7901 Long term (current) use of anticoagulants: Secondary | ICD-10-CM | POA: Diagnosis not present

## 2016-02-01 DIAGNOSIS — M7731 Calcaneal spur, right foot: Secondary | ICD-10-CM | POA: Diagnosis not present

## 2016-02-01 DIAGNOSIS — S92341A Displaced fracture of fourth metatarsal bone, right foot, initial encounter for closed fracture: Secondary | ICD-10-CM | POA: Diagnosis not present

## 2016-02-01 DIAGNOSIS — M7989 Other specified soft tissue disorders: Secondary | ICD-10-CM | POA: Diagnosis not present

## 2016-02-01 DIAGNOSIS — S92331A Displaced fracture of third metatarsal bone, right foot, initial encounter for closed fracture: Secondary | ICD-10-CM | POA: Diagnosis not present

## 2016-02-07 DIAGNOSIS — S92341A Displaced fracture of fourth metatarsal bone, right foot, initial encounter for closed fracture: Secondary | ICD-10-CM | POA: Diagnosis not present

## 2016-02-07 DIAGNOSIS — B351 Tinea unguium: Secondary | ICD-10-CM | POA: Diagnosis not present

## 2016-02-07 DIAGNOSIS — S92331A Displaced fracture of third metatarsal bone, right foot, initial encounter for closed fracture: Secondary | ICD-10-CM | POA: Diagnosis not present

## 2016-02-07 DIAGNOSIS — S92324A Nondisplaced fracture of second metatarsal bone, right foot, initial encounter for closed fracture: Secondary | ICD-10-CM | POA: Diagnosis not present

## 2016-02-10 ENCOUNTER — Encounter: Payer: Self-pay | Admitting: Nurse Practitioner

## 2016-02-10 DIAGNOSIS — R069 Unspecified abnormalities of breathing: Secondary | ICD-10-CM | POA: Diagnosis not present

## 2016-02-16 ENCOUNTER — Ambulatory Visit (INDEPENDENT_AMBULATORY_CARE_PROVIDER_SITE_OTHER): Payer: Medicare Other | Admitting: Nurse Practitioner

## 2016-02-16 ENCOUNTER — Encounter: Payer: Self-pay | Admitting: Nurse Practitioner

## 2016-02-16 VITALS — BP 170/76 | HR 68 | Ht 62.5 in | Wt 110.0 lb

## 2016-02-16 DIAGNOSIS — I480 Paroxysmal atrial fibrillation: Secondary | ICD-10-CM | POA: Diagnosis not present

## 2016-02-16 DIAGNOSIS — I2510 Atherosclerotic heart disease of native coronary artery without angina pectoris: Secondary | ICD-10-CM | POA: Diagnosis not present

## 2016-02-16 DIAGNOSIS — I10 Essential (primary) hypertension: Secondary | ICD-10-CM

## 2016-02-16 DIAGNOSIS — I739 Peripheral vascular disease, unspecified: Secondary | ICD-10-CM

## 2016-02-16 DIAGNOSIS — I779 Disorder of arteries and arterioles, unspecified: Secondary | ICD-10-CM | POA: Diagnosis not present

## 2016-02-16 DIAGNOSIS — Z7901 Long term (current) use of anticoagulants: Secondary | ICD-10-CM

## 2016-02-16 NOTE — Patient Instructions (Addendum)
We will be checking the following labs today - NONE   Medication Instructions:    Continue with your current medicines.     Testing/Procedures To Be Arranged:  Carotid dopplers  Lower extremity arterial dopplers  Follow-Up:   See Dr. Angelena Form back for discussion    Other Special Instructions:   N/A    If you need a refill on your cardiac medications before your next appointment, please call your pharmacy.   Call the [place] office at [phone] if you have any questions, problems or concerns.

## 2016-02-16 NOTE — Addendum Note (Signed)
Addended by: Burtis Junes on: 02/16/2016 09:32 AM   Modules accepted: Miquel Dunn

## 2016-02-16 NOTE — Progress Notes (Addendum)
CARDIOLOGY OFFICE NOTE  Date:  02/16/2016    [name] Date of Birth: 1928-07-24 Medical Record [id number]  PCP:  Marton Redwood, MD  Cardiologist:  [facility]    Chief Complaint  Patient presents with  . Coronary Artery Disease  . Hypertension  . Atrial Fibrillation  . PAD    1 year check - seen for Dr. Angelena Form    History of Present Illness: [name] is a 82 y.o. female who presents today for an almost one year check. Seen for Dr. Angelena Form.   She has a history of PAD, CAD s/p CABG 1/10 with Dr. Cyndia Bent, HTN, hyperlipidemia, former tobacco abuse, paroxysmal atrial fibrillation and COPD. She remains on chronic anticoagulation with coumadin. She has done well with conservative management of her PAD. She has not tolerated statins or beta blockers in the past. She was admitted to [facility] in September 2012 with c/o chest pain. She ruled out for an MI and was discharged home. She wore a 21 day event monitor which showed normal sinus rhythm with no SVT, VT, atrial fibrillation. Incidental finding of echogenic vascularized structures in the bilateral thyroid in 2014. This has been addressed by Dr. Brigitte Pulse in primary care. She has refused biopsy of her thyroid. Seen back in October 2014 with c/o chest pains. Had arranged a stress myoview but she cancelled the test.   Last seen last May - felt to be doing ok.  Comes back today. Here alone. She is carrying her cane. Says she feels "ridiculous" for being here. She called EMS last month due to "buldging diaphragm that was beating hard". She was going to turn off the TV and got abruptly short of breath - her spell was over by the time they got to her house. She stayed at home. Has not had any recurrence. She says EMS told her to follow up here. She fell 4 weeks ago while in a parking garage in [place] - fractured some toes and hit her head - she did not seek any initial evaluation until she got home - only went to orthopedics - given the cane  that "she can't operate" and was put in a boot - that she has stopped wearing. She continues to have over sensitive left sided chest pain. No exertional symptoms. Not dizzy or lightheaded. No claudication. No ulcers in her legs. Asking about her doppler studies and when she will see Dr. Angelena Form again.   Past Medical History  Diagnosis Date  . COPD (chronic obstructive pulmonary disease) ([place])   . Former smoker   . SOB (shortness of breath)     uncertain etiology  . CAD (coronary artery disease)     status post previous anterior wall myocardial infarction, treated with PTCA dx and subsequent non-ST- elevation myocardial infarction with recent bypass surgery, November 29, 2008.   Marland Kitchen HTN (hypertension)   . Hyperlipidemia   . PVD (peripheral vascular disease) ([place])     with claudication  . Arrhythmia 02/2009    Paroxysmal Fibrillation  . Colitis, ischemic (HCC)     Hx of  . Raynaud phenomenon   . Herpes zoster ophthalmicus   . Postherpetic neuralgia   . Iron deficiency   . Colon neoplasm     Family history of    Past Surgical History  Procedure Laterality Date  . Coronary artery bypass graft      11/27/2008  . Appendectomy       Medications: Current Outpatient Prescriptions  Medication  Sig Dispense Refill  . ALPRAZolam (XANAX) 0.25 MG tablet Take 0.25 mg by mouth every 8 (eight) hours as needed for sleep.    Marland Kitchen amLODipine (NORVASC) 5 MG tablet Take 1 tablet (5 mg total) by mouth 2 (two) times daily. 30 tablet 11  . aspirin 81 MG EC tablet Take 81 mg by mouth daily.      . diclofenac sodium (VOLTAREN) 1 % GEL Apply 2 g topically 4 (four) times daily. Rub into affected area of foot 2 to 4 times daily 100 g 2  . docusate sodium (COLACE) 100 MG capsule Take 100 mg by mouth as needed (for constipation).     . nitroGLYCERIN (NITROSTAT) 0.4 MG SL tablet Place 0.4 mg under the tongue as needed (for chest pain).     Marland Kitchen warfarin (COUMADIN) 2.5 MG tablet Take 2.5-3.75 mg by mouth See admin  instructions. Monday, Wednesday, Friday and Sunday take 1 1/2 tablets. On Tuesday Thursday and Saturday take 1 tablet     No current facility-administered medications for this visit.    Allergies: Allergies  Allergen Reactions  . Beta Adrenergic Blockers   . Cardizem [Diltiazem Hcl]   . Clopidogrel Bisulfate     REACTION: naseau  . Codeine   . Statins     Social History: The patient  reports that she has quit smoking. She has never used smokeless tobacco. She reports that she drinks alcohol. She reports that she does not use illicit drugs.   Family History: The patient's family history includes Colon cancer in her father and mother.   Review of Systems: Please see the history of present illness.   Otherwise, the review of systems is positive for none.   All other systems are reviewed and negative.   Physical Exam: VS:  BP 170/76 mmHg  Pulse 68  Ht 5' 2.5" (1.588 m)  Wt 110 lb (49.896 kg)  BMI 19.79 kg/m2 .  BMI Body mass index is 19.79 kg/(m^2).  Wt Readings from Last 3 Encounters:  02/16/16 110 lb (49.896 kg)  03/23/15 112 lb 12.8 oz (51.166 kg)  03/16/14 110 lb 12.8 oz (50.259 kg)    General: Pleasant. Elderly female who is alert in no acute distress.  HEENT: Normal. Neck: Supple, no JVD, carotid bruits, or masses noted.  Cardiac:Regular rate and rhythm. No murmurs, rubs, or gallops. No edema.  Respiratory:  Lungs are clear to auscultation bilaterally with normal work of breathing.  GI: Soft and nontender.  MS: No deformity or atrophy. Gait and ROM intact. Skin: Warm and dry. Color is normal.  Neuro:  Strength and sensation are intact and no gross focal deficits noted.  Psych: Alert, appropriate and with normal affect.   LABORATORY DATA:  EKG:  EKG is not ordered today. The strip she brought from EMS shows sinus rhythm.  Lab Results  Component Value Date   WBC 6.8 07/31/2011   HGB 14.2 07/31/2011   HCT 40.6 07/31/2011   PLT 331 07/31/2011   GLUCOSE 92  08/01/2011   CHOL * 08/23/2010    230        ATP III CLASSIFICATION:  <200     mg/dL   Desirable  200-239  mg/dL   Borderline High  >=240    mg/dL   High          TRIG 71 08/23/2010   HDL 90 08/23/2010   LDLCALC * 08/23/2010    126        Total Cholesterol/HDL:CHD Risk Coronary  Heart Disease Risk Table                     Men   Women  1/2 Average Risk   3.4   3.3  Average Risk       5.0   4.4  2 X Average Risk   9.6   7.1  3 X Average Risk  23.4   11.0        Use the calculated Patient Ratio above and the CHD Risk Table to determine the patient's CHD Risk.        ATP III CLASSIFICATION (LDL):  <100     mg/dL   Optimal  100-129  mg/dL   Near or Above                    Optimal  130-159  mg/dL   Borderline  160-189  mg/dL   High  >190     mg/dL   Very High   ALT 15 07/31/2011   AST 16 07/31/2011   NA 140 08/01/2011   K 3.7 08/01/2011   CL 103 08/01/2011   CREATININE 0.74 08/01/2011   BUN 18 08/01/2011   CO2 26 08/01/2011   TSH 6.921* 08/01/2011   INR 1.88* 08/01/2011    BNP (last 3 results) No results for input(s): BNP in the last 8760 hours.  ProBNP (last 3 results) No results for input(s): PROBNP in the last 8760 hours.   Other Studies Reviewed Today:  ABI from 03/2015 0.52 on the right and 0.91 on the left.   Carotid artery dopplers from 03/2015 with 0-39% RICA stenosis and 123456 LICA stenosis, stable  Assessment/Plan: 1. PAD: She has no pain in her legs with walking. No rest pain or ulcerations. Continue walking daily. She will alert [place] if there is a change in her clinical status. Repeat ABI.  2. HYPERTENSION: BP is elevated today but remains controlled per her home log. Will continue amlodipine. No changes today.   3. CAD: Stable. No active symptoms. She does not tolerate beta blockers or statins.   4. CAROTID ARTERY DISEASE: Mild to moderate bilateral disease by carotid dopplers. Repeat carotid dopplers arranged.   5. ATRIAL FIBRILLATION:  Maintaining sinus rhythm. Continue anti-coagulation with coumadin. This is followed in primary care.   6. Chronic anticoagulation - explained that if she continues to fall she will need to have her coumadin discontinued. Explained that whenever she hits her head - she needs to get immediate medical attention.   Current medicines are reviewed with the patient today.  The patient does not have concerns regarding medicines other than what has been noted above.  The following changes have been made:  See above.  Labs/ tests ordered today include:   No orders of the defined types were placed in this encounter.     Disposition:   FU with Dr. Angelena Form prn.   Patient is agreeable to this plan and will call if any problems develop in the interim.   Signed: Burtis Junes, RN, ANP-C 02/16/2016 9:28 AM  [facility] [address] [place], [place]  [address] Phone: [phone] Fax: [phone]

## 2016-02-17 ENCOUNTER — Other Ambulatory Visit: Payer: Self-pay | Admitting: Nurse Practitioner

## 2016-02-17 DIAGNOSIS — I10 Essential (primary) hypertension: Secondary | ICD-10-CM

## 2016-02-17 DIAGNOSIS — I739 Peripheral vascular disease, unspecified: Secondary | ICD-10-CM

## 2016-02-23 ENCOUNTER — Ambulatory Visit (HOSPITAL_COMMUNITY)
Admission: RE | Admit: 2016-02-23 | Discharge: 2016-02-23 | Disposition: A | Discharge status: Home / Self Care | Payer: Medicare Other | Source: Ambulatory Visit | Attending: Nurse Practitioner | Admitting: Nurse Practitioner

## 2016-02-23 ENCOUNTER — Ambulatory Visit (HOSPITAL_COMMUNITY)
Admission: RE | Admit: 2016-02-23 | Discharge: 2016-02-23 | Disposition: A | Discharge status: Home / Self Care | Payer: Medicare Other | Source: Ambulatory Visit | Attending: Cardiology | Admitting: Cardiology

## 2016-02-23 DIAGNOSIS — I480 Paroxysmal atrial fibrillation: Secondary | ICD-10-CM | POA: Insufficient documentation

## 2016-02-23 DIAGNOSIS — I2510 Atherosclerotic heart disease of native coronary artery without angina pectoris: Secondary | ICD-10-CM | POA: Diagnosis not present

## 2016-02-23 DIAGNOSIS — I10 Essential (primary) hypertension: Secondary | ICD-10-CM

## 2016-02-23 DIAGNOSIS — E785 Hyperlipidemia, unspecified: Secondary | ICD-10-CM | POA: Diagnosis not present

## 2016-02-23 DIAGNOSIS — I739 Peripheral vascular disease, unspecified: Secondary | ICD-10-CM | POA: Insufficient documentation

## 2016-02-23 DIAGNOSIS — I6523 Occlusion and stenosis of bilateral carotid arteries: Secondary | ICD-10-CM | POA: Insufficient documentation

## 2016-02-23 DIAGNOSIS — I779 Disorder of arteries and arterioles, unspecified: Secondary | ICD-10-CM

## 2016-02-23 DIAGNOSIS — Z7901 Long term (current) use of anticoagulants: Secondary | ICD-10-CM | POA: Insufficient documentation

## 2016-02-23 DIAGNOSIS — Z87891 Personal history of nicotine dependence: Secondary | ICD-10-CM | POA: Insufficient documentation

## 2016-02-23 DIAGNOSIS — R938 Abnormal findings on diagnostic imaging of other specified body structures: Secondary | ICD-10-CM | POA: Diagnosis not present

## 2016-02-28 DIAGNOSIS — S92331D Displaced fracture of third metatarsal bone, right foot, subsequent encounter for fracture with routine healing: Secondary | ICD-10-CM | POA: Diagnosis not present

## 2016-02-28 DIAGNOSIS — S92324D Nondisplaced fracture of second metatarsal bone, right foot, subsequent encounter for fracture with routine healing: Secondary | ICD-10-CM | POA: Diagnosis not present

## 2016-02-28 DIAGNOSIS — S92341D Displaced fracture of fourth metatarsal bone, right foot, subsequent encounter for fracture with routine healing: Secondary | ICD-10-CM | POA: Diagnosis not present

## 2016-02-28 DIAGNOSIS — B351 Tinea unguium: Secondary | ICD-10-CM | POA: Diagnosis not present

## 2016-03-02 DIAGNOSIS — I480 Paroxysmal atrial fibrillation: Secondary | ICD-10-CM | POA: Diagnosis not present

## 2016-03-02 DIAGNOSIS — Z7901 Long term (current) use of anticoagulants: Secondary | ICD-10-CM | POA: Diagnosis not present

## 2016-04-11 DIAGNOSIS — Z7901 Long term (current) use of anticoagulants: Secondary | ICD-10-CM | POA: Diagnosis not present

## 2016-04-11 DIAGNOSIS — I480 Paroxysmal atrial fibrillation: Secondary | ICD-10-CM | POA: Diagnosis not present

## 2016-04-18 ENCOUNTER — Ambulatory Visit (INDEPENDENT_AMBULATORY_CARE_PROVIDER_SITE_OTHER): Payer: Medicare Other | Admitting: Cardiovascular Disease

## 2016-04-18 ENCOUNTER — Encounter: Payer: Self-pay | Admitting: Cardiovascular Disease

## 2016-04-18 VITALS — BP 134/68 | HR 62 | Ht 62.5 in | Wt 112.8 lb

## 2016-04-18 DIAGNOSIS — I2510 Atherosclerotic heart disease of native coronary artery without angina pectoris: Secondary | ICD-10-CM | POA: Diagnosis not present

## 2016-04-18 DIAGNOSIS — I10 Essential (primary) hypertension: Secondary | ICD-10-CM | POA: Diagnosis not present

## 2016-04-18 DIAGNOSIS — I779 Disorder of arteries and arterioles, unspecified: Secondary | ICD-10-CM

## 2016-04-18 DIAGNOSIS — I739 Peripheral vascular disease, unspecified: Secondary | ICD-10-CM

## 2016-04-18 DIAGNOSIS — I480 Paroxysmal atrial fibrillation: Secondary | ICD-10-CM

## 2016-04-18 NOTE — Patient Instructions (Signed)

## 2016-04-18 NOTE — Progress Notes (Signed)
Chief Complaint  Patient presents with  . Foot Pain    History of Present Illness: 82 yo WF with history of PAD, CAD s/p CABG 1/10, HTN, hyperlipidemia, former tobacco abuse, paroxysmal atrial fibrillation and COPD who is here today for PV and cardiac follow up. I initially saw her in June of 2010 for PV workup. Her cardiac issues had been followed by Dr. Olevia Perches in the past. She has done well with conservative management of her PAD. She has not tolerated statins or beta blockers in the past. She was admitted to [facility] in September 2012 with c/o chest pain. She ruled out for an MI and was discharged home. She wore a 21 day event monitor which showed normal sinus rhythm with no SVT, VT, atrial fibrillation.    Incidental finding of echogenic vascularized structures in the bilateral thyroid in 2014. This has been addressed by Dr. Brigitte Pulse in primary care. She has refused biopsy of her thyroid. I saw her in October 2014 and she was c/o chest pains. I arranged a stress myoview but she cancelled the test. She had a bad fall in March 2017 in [place] and broke several bones in her right foot and several toes. ABI April 2017 0.66 on the right and 0.96 on the left. Carotid artery dopplers April 2017 with 0-39% RICA stenosis and 123456 LICA stenosis, stable.   She is here today for follow up. She has had no exertional chest pains. No SOB. No leg pain, rest pain in the legs or LE ulcerations. Her right foot is still painful. She is known to have broken toes after her fall. She nicked her 2nd toe with the nail clippers. The wound has healed but the second toe is still painful.   Primary Care Physician: Marton Redwood, MD  Last Lipid Profile: Followed in primary care.   Past Medical History  Diagnosis Date  . COPD (chronic obstructive pulmonary disease) ([place])   . Former smoker   . SOB (shortness of breath)     uncertain etiology  . CAD (coronary artery disease)     status post previous anterior wall  myocardial infarction, treated with PTCA dx and subsequent non-ST- elevation myocardial infarction with recent bypass surgery, November 29, 2008.   Marland Kitchen HTN (hypertension)   . Hyperlipidemia   . PVD (peripheral vascular disease) ([place])     with claudication  . Arrhythmia 02/2009    Paroxysmal Fibrillation  . Colitis, ischemic (HCC)     Hx of  . Raynaud phenomenon   . Herpes zoster ophthalmicus   . Postherpetic neuralgia   . Iron deficiency   . Colon neoplasm     Family history of    Past Surgical History  Procedure Laterality Date  . Coronary artery bypass graft      11/27/2008  . Appendectomy      Current Outpatient Prescriptions  Medication Sig Dispense Refill  . ALPRAZolam (XANAX) 0.25 MG tablet Take 0.25 mg by mouth every 8 (eight) hours as needed for sleep.    Marland Kitchen amLODipine (NORVASC) 5 MG tablet Take 1 tablet (5 mg total) by mouth 2 (two) times daily. 30 tablet 11  . aspirin 81 MG EC tablet Take 81 mg by mouth daily.      Marland Kitchen docusate sodium (COLACE) 100 MG capsule Take 100 mg by mouth as needed (for constipation).     . nitroGLYCERIN (NITROSTAT) 0.4 MG SL tablet Place 0.4 mg under the tongue as needed (for chest pain).     Marland Kitchen  Vitamin D, Cholecalciferol, 1000 units CAPS Take 1,000 Units by mouth daily.    Marland Kitchen warfarin (COUMADIN) 2.5 MG tablet Take 2.5-3.75 mg by mouth See admin instructions. Monday, Wednesday, Friday and Sunday take 1 1/2 tablets. On Tuesday Thursday and Saturday take 1 tablet     No current facility-administered medications for this visit.    Allergies  Allergen Reactions  . Beta Adrenergic Blockers   . Cardizem [Diltiazem Hcl]   . Clopidogrel Bisulfate     REACTION: naseau  . Codeine   . Statins   . Tylenol [Acetaminophen] Other (See Comments)    Pt stated this made her sick    Social History   Social History  . Marital Status: Widowed    Spouse Name: N/A  . Number of Children: N/A  . Years of Education: N/A   Occupational History  . Homemaker     Social History Main Topics  . Smoking status: Former Research scientist (life sciences)  . Smokeless tobacco: Never Used     Comment: Tobacco abuse for 65 years, one pack per day, stopped in January 2010.   Marland Kitchen Alcohol Use: Yes     Comment: 2 alcoholic beverages per day.  . Drug Use: No  . Sexual Activity: Not on file   Other Topics Concern  . Not on file   Social History Narrative    Family History  Problem Relation Age of Onset  . Colon cancer Father   . Colon cancer Mother     Review of Systems:  As stated in the HPI and otherwise negative.   BP 134/68 mmHg  Pulse 62  Ht 5' 2.5" (1.588 m)  Wt 112 lb 12.8 oz (51.166 kg)  BMI 20.29 kg/m2  Physical Examination: General: Well developed, well nourished, NAD HEENT: OP clear, mucus membranes moist SKIN: warm, dry. No rashes. Neuro: No focal deficits Musculoskeletal: Muscle strength 5/5 all ext Psychiatric: Mood and affect normal Neck: No JVD, no carotid bruits, no thyromegaly, no lymphadenopathy. Lungs:Clear bilaterally, no wheezes, rhonci, crackles Cardiovascular: Regular rate and rhythm. No murmurs, gallops or rubs. Abdomen:Soft. Bowel sounds present. Non-tender.  Extremities: No lower extremity edema. Pulses are trace to 1 + in the bilateral DP/PT.  EKG:  EKG is ordered today. The ekg ordered today demonstrates NSR, rate 62 bpm.   Recent Labs: No results found for requested labs within last 365 days.   Lipid Panel Followed in primary care    Wt Readings from Last 3 Encounters:  04/18/16 112 lb 12.8 oz (51.166 kg)  02/16/16 110 lb (49.896 kg)  03/23/15 112 lb 12.8 oz (51.166 kg)     Other studies Reviewed: Additional studies/ records that were reviewed today include: . Review of the above records demonstrates:    Assessment and Plan:   1. PAD:  Her LE dopplers are stable April 2017. She has no pain in her legs with walking. No rest pain or ulcerations. Continue walking daily. She will alert [place] if there is a change in her clinical  status. Repeat ABI May 2017.   2. HYPERTENSION:  BP is controlled today. Will continue amlodipine. No changes today.     3. CAD:  Stable. She does not tolerate beta blockers or statins.     4. CAROTID ARTERY DISEASE:  Mild to moderate bilateral disease by carotid dopplers April 2017. Repeat April 2018.  5. ATRIAL FIBRILLATION: Maintaining sinus rhythm. Continue anti-coagulation with coumadin. This is followed in primary care.   6. Right foot pain: Likely due to broken  toes sustained 2 months ago. She will alert [place] if the second toe begins to change color or if there is evidence of non-healing or fevers.   Current medicines are reviewed at length with the patient today.  The patient does not have concerns regarding medicines.  The following changes have been made:  no change  Labs/ tests ordered today include:   Orders Placed This Encounter  Procedures  . EKG 12-Lead    Disposition:   FU with me in 6  months  Signed, Lauree Chandler, MD 04/18/2016 2:40 PM    [facility] [place], [place], [place]  [address] Phone: [phone]; Fax: [phone]

## 2016-04-25 ENCOUNTER — Ambulatory Visit (INDEPENDENT_AMBULATORY_CARE_PROVIDER_SITE_OTHER): Payer: Medicare Other

## 2016-04-25 ENCOUNTER — Ambulatory Visit (INDEPENDENT_AMBULATORY_CARE_PROVIDER_SITE_OTHER): Payer: Medicare Other | Admitting: Podiatry

## 2016-04-25 ENCOUNTER — Encounter: Payer: Self-pay | Admitting: Podiatry

## 2016-04-25 DIAGNOSIS — I2510 Atherosclerotic heart disease of native coronary artery without angina pectoris: Secondary | ICD-10-CM | POA: Diagnosis not present

## 2016-04-25 DIAGNOSIS — M79676 Pain in unspecified toe(s): Secondary | ICD-10-CM

## 2016-04-25 DIAGNOSIS — S92911A Unspecified fracture of right toe(s), initial encounter for closed fracture: Secondary | ICD-10-CM | POA: Diagnosis not present

## 2016-04-25 DIAGNOSIS — Q828 Other specified congenital malformations of skin: Secondary | ICD-10-CM | POA: Diagnosis not present

## 2016-04-25 DIAGNOSIS — B351 Tinea unguium: Secondary | ICD-10-CM | POA: Diagnosis not present

## 2016-04-25 DIAGNOSIS — S99921A Unspecified injury of right foot, initial encounter: Secondary | ICD-10-CM

## 2016-04-25 NOTE — Progress Notes (Signed)
She presents today with a chief complaint of painful elongated toenails as well as trauma to the right foot from a fall that she had taken in March while in [place].  Objective: Vital signs are stable alert and oriented 3 pulses are palpable. Less palpable on the right foot than the left however capillary fill time is immediate bilateral. Neurologic sensorium is intact. Deep tendon reflexes are intact. Muscle strength is normal bilateral. Orthopedic evaluation and x-rays rigid hammertoe deformities bilateral pain on palpation to the DIPJ of the second digit and the third and fourth MTPJ right foot. Cutaneous evaluation demonstrates supple well-hydrated cutis toenails are thick yellow dystrophic onychomycotic with a distal clavus to the second digit of the right foot.  Assessment: Distal clavus second digit right foot. Hammertoe deformities bilateral. Pain in limb secondary to onychomycosis. Fracture DIPJ second digit right foot fracture metatarsal heads healing normally third and fourth right foot.  Plan: Debridement of toenails 1 through 5 bilateral. Debridement bariatric hyperkeratosis. Follow up with her in 2 months.

## 2016-04-27 DIAGNOSIS — I480 Paroxysmal atrial fibrillation: Secondary | ICD-10-CM | POA: Diagnosis not present

## 2016-04-27 DIAGNOSIS — Z7901 Long term (current) use of anticoagulants: Secondary | ICD-10-CM | POA: Diagnosis not present

## 2016-05-09 DIAGNOSIS — E784 Other hyperlipidemia: Secondary | ICD-10-CM | POA: Diagnosis not present

## 2016-05-09 DIAGNOSIS — M859 Disorder of bone density and structure, unspecified: Secondary | ICD-10-CM | POA: Diagnosis not present

## 2016-05-09 DIAGNOSIS — E041 Nontoxic single thyroid nodule: Secondary | ICD-10-CM | POA: Diagnosis not present

## 2016-05-09 DIAGNOSIS — Z7901 Long term (current) use of anticoagulants: Secondary | ICD-10-CM | POA: Diagnosis not present

## 2016-05-09 DIAGNOSIS — I480 Paroxysmal atrial fibrillation: Secondary | ICD-10-CM | POA: Diagnosis not present

## 2016-05-19 DIAGNOSIS — I480 Paroxysmal atrial fibrillation: Secondary | ICD-10-CM | POA: Diagnosis not present

## 2016-05-19 DIAGNOSIS — Z1389 Encounter for screening for other disorder: Secondary | ICD-10-CM | POA: Diagnosis not present

## 2016-05-19 DIAGNOSIS — Z0000 Encounter for general adult medical examination without abnormal findings: Secondary | ICD-10-CM | POA: Diagnosis not present

## 2016-05-19 DIAGNOSIS — I25810 Atherosclerosis of coronary artery bypass graft(s) without angina pectoris: Secondary | ICD-10-CM | POA: Diagnosis not present

## 2016-05-19 DIAGNOSIS — R030 Elevated blood-pressure reading, without diagnosis of hypertension: Secondary | ICD-10-CM | POA: Diagnosis not present

## 2016-05-19 DIAGNOSIS — I509 Heart failure, unspecified: Secondary | ICD-10-CM | POA: Diagnosis not present

## 2016-05-19 DIAGNOSIS — M79621 Pain in right upper arm: Secondary | ICD-10-CM | POA: Diagnosis not present

## 2016-05-19 DIAGNOSIS — E784 Other hyperlipidemia: Secondary | ICD-10-CM | POA: Diagnosis not present

## 2016-05-19 DIAGNOSIS — Z7901 Long term (current) use of anticoagulants: Secondary | ICD-10-CM | POA: Diagnosis not present

## 2016-05-19 DIAGNOSIS — R7301 Impaired fasting glucose: Secondary | ICD-10-CM | POA: Diagnosis not present

## 2016-06-07 DIAGNOSIS — I480 Paroxysmal atrial fibrillation: Secondary | ICD-10-CM | POA: Diagnosis not present

## 2016-06-07 DIAGNOSIS — Z7901 Long term (current) use of anticoagulants: Secondary | ICD-10-CM | POA: Diagnosis not present

## 2016-07-04 ENCOUNTER — Ambulatory Visit (INDEPENDENT_AMBULATORY_CARE_PROVIDER_SITE_OTHER): Payer: Medicare Other

## 2016-07-04 ENCOUNTER — Encounter: Payer: Self-pay | Admitting: Podiatry

## 2016-07-04 ENCOUNTER — Ambulatory Visit (INDEPENDENT_AMBULATORY_CARE_PROVIDER_SITE_OTHER): Payer: Medicare Other | Admitting: Podiatry

## 2016-07-04 DIAGNOSIS — Q828 Other specified congenital malformations of skin: Secondary | ICD-10-CM

## 2016-07-04 DIAGNOSIS — B351 Tinea unguium: Secondary | ICD-10-CM

## 2016-07-04 DIAGNOSIS — M79676 Pain in unspecified toe(s): Secondary | ICD-10-CM | POA: Diagnosis not present

## 2016-07-04 DIAGNOSIS — S92911D Unspecified fracture of right toe(s), subsequent encounter for fracture with routine healing: Secondary | ICD-10-CM

## 2016-07-04 NOTE — Progress Notes (Signed)
She presents a chief complaint of painful elongated toenails and it core into the distal aspect of the second digit right foot.  Objective: Vital signs are stable she is alert and oriented 3. Pulses are palpable. Thick yellow dystrophic mycotic nail sharply incurvated nail margins and a distal clavus second digit right foot no signs of skin breakdown.  Assessment: Hammertoe deformities distal clavus second digit right foot pain limb secondary onychomycosis.  Plan: Debridement of all reactive hyperkeratosis debridement of toenails 1 through 5 bilateral. Follow up with her in 2 months

## 2016-07-11 DIAGNOSIS — I480 Paroxysmal atrial fibrillation: Secondary | ICD-10-CM | POA: Diagnosis not present

## 2016-07-11 DIAGNOSIS — Z7901 Long term (current) use of anticoagulants: Secondary | ICD-10-CM | POA: Diagnosis not present

## 2016-08-10 DIAGNOSIS — Z7901 Long term (current) use of anticoagulants: Secondary | ICD-10-CM | POA: Diagnosis not present

## 2016-08-10 DIAGNOSIS — I480 Paroxysmal atrial fibrillation: Secondary | ICD-10-CM | POA: Diagnosis not present

## 2016-09-05 ENCOUNTER — Encounter: Payer: Self-pay | Admitting: Podiatry

## 2016-09-05 ENCOUNTER — Ambulatory Visit (INDEPENDENT_AMBULATORY_CARE_PROVIDER_SITE_OTHER): Payer: Medicare Other | Admitting: Podiatry

## 2016-09-05 DIAGNOSIS — M79676 Pain in unspecified toe(s): Secondary | ICD-10-CM | POA: Diagnosis not present

## 2016-09-05 DIAGNOSIS — B351 Tinea unguium: Secondary | ICD-10-CM | POA: Diagnosis not present

## 2016-09-05 DIAGNOSIS — Q828 Other specified congenital malformations of skin: Secondary | ICD-10-CM

## 2016-09-05 NOTE — Progress Notes (Signed)
She presents today with a chief complaint of painful elongated toenails and multiple calluses to the plantar aspect of the bilateral foot.  Objective: Vital signs are stable alert and oriented 3. Pulses are palpable. Toenails are thick yellow dystrophic mycotic painful on palpation. Multiple porokeratotic lesions plantar aspect of the foot and distal toes are present. No open lesions or wounds are noted.  Assessment: Pain limb secondary to onychomycosis with multiple porokeratotic lesions plantar aspect bilateral foot.  Plan: Debridement of toenails and calluses bilateral.

## 2016-09-06 DIAGNOSIS — I480 Paroxysmal atrial fibrillation: Secondary | ICD-10-CM | POA: Diagnosis not present

## 2016-09-06 DIAGNOSIS — Z7901 Long term (current) use of anticoagulants: Secondary | ICD-10-CM | POA: Diagnosis not present

## 2016-09-25 ENCOUNTER — Telehealth: Payer: Self-pay | Admitting: Cardiovascular Disease

## 2016-09-25 DIAGNOSIS — I739 Peripheral vascular disease, unspecified: Secondary | ICD-10-CM

## 2016-09-25 DIAGNOSIS — I779 Disorder of arteries and arterioles, unspecified: Secondary | ICD-10-CM

## 2016-09-25 NOTE — Telephone Encounter (Signed)
I spoke to patient and advised ABI's due 03/2017, Carotid due 02/2017, 6 mo f/u due 10/2016. She voiced understanding.  Please schedule the above procedures and 6 mo f/u with Dr. Angelena Form.  Thank you!

## 2016-09-25 NOTE — Telephone Encounter (Signed)
Pt thought she was to have an ov, recall shows Dec, a Carotid, recall show April, and a doppler of her leg/legs, do not see that, she wants to make sure the Carotid not due until April, and if she doesn't need her legs scanned as well--pls call

## 2016-09-26 NOTE — Telephone Encounter (Signed)
Patient is schedule for 12-13-16 with Dr. Angelena Form.     Carotid 02-21-17 and ABI 03-13-17.   Appointments info mailed to patient.

## 2016-10-04 DIAGNOSIS — Z7901 Long term (current) use of anticoagulants: Secondary | ICD-10-CM | POA: Diagnosis not present

## 2016-10-04 DIAGNOSIS — I480 Paroxysmal atrial fibrillation: Secondary | ICD-10-CM | POA: Diagnosis not present

## 2016-11-09 ENCOUNTER — Encounter: Payer: Self-pay | Admitting: Podiatry

## 2016-11-09 ENCOUNTER — Ambulatory Visit (INDEPENDENT_AMBULATORY_CARE_PROVIDER_SITE_OTHER): Payer: Medicare Other | Admitting: Podiatry

## 2016-11-09 DIAGNOSIS — Q828 Other specified congenital malformations of skin: Secondary | ICD-10-CM

## 2016-11-09 DIAGNOSIS — B351 Tinea unguium: Secondary | ICD-10-CM

## 2016-11-09 DIAGNOSIS — M79676 Pain in unspecified toe(s): Secondary | ICD-10-CM

## 2016-11-09 NOTE — Progress Notes (Signed)
She presents today for chief complaint of painful elongated toenails and calluses.  Objective: Vital signs are stable she is alert and oriented 3 pulses are palpable. She has pain on palpation of the porokeratosis plantar aspect of the foot and the second toe distally she also has thick yellow dystrophic onychomycotic nails. No open lesions or wounds.  Assessment: Hammertoe deformities with osteoarthritic changes distal clavi including plantar aspect of the forefoot. Patient lives in her onychomycosis.  Plan: Debridement of all reactive hyperkeratotic tissue and debridement of toenails 1 through 5 bilateral. Follow up with her in 3 months

## 2016-11-14 DIAGNOSIS — I480 Paroxysmal atrial fibrillation: Secondary | ICD-10-CM | POA: Diagnosis not present

## 2016-11-14 DIAGNOSIS — Z7901 Long term (current) use of anticoagulants: Secondary | ICD-10-CM | POA: Diagnosis not present

## 2016-11-17 DIAGNOSIS — I6523 Occlusion and stenosis of bilateral carotid arteries: Secondary | ICD-10-CM | POA: Diagnosis not present

## 2016-11-17 DIAGNOSIS — I7389 Other specified peripheral vascular diseases: Secondary | ICD-10-CM | POA: Diagnosis not present

## 2016-11-17 DIAGNOSIS — S99191S Other physeal fracture of right metatarsal, sequela: Secondary | ICD-10-CM | POA: Diagnosis not present

## 2016-11-17 DIAGNOSIS — R030 Elevated blood-pressure reading, without diagnosis of hypertension: Secondary | ICD-10-CM | POA: Diagnosis not present

## 2016-11-17 DIAGNOSIS — R7301 Impaired fasting glucose: Secondary | ICD-10-CM | POA: Diagnosis not present

## 2016-11-17 DIAGNOSIS — M79671 Pain in right foot: Secondary | ICD-10-CM | POA: Diagnosis not present

## 2016-11-17 DIAGNOSIS — I25810 Atherosclerosis of coronary artery bypass graft(s) without angina pectoris: Secondary | ICD-10-CM | POA: Diagnosis not present

## 2016-11-17 DIAGNOSIS — Z955 Presence of coronary angioplasty implant and graft: Secondary | ICD-10-CM | POA: Diagnosis not present

## 2016-11-17 DIAGNOSIS — Z6821 Body mass index (BMI) 21.0-21.9, adult: Secondary | ICD-10-CM | POA: Diagnosis not present

## 2016-12-13 ENCOUNTER — Ambulatory Visit (INDEPENDENT_AMBULATORY_CARE_PROVIDER_SITE_OTHER): Payer: Medicare Other | Admitting: Cardiovascular Disease

## 2016-12-13 VITALS — BP 164/70 | HR 70 | Ht 63.0 in | Wt 112.4 lb

## 2016-12-13 DIAGNOSIS — I739 Peripheral vascular disease, unspecified: Secondary | ICD-10-CM | POA: Diagnosis not present

## 2016-12-13 DIAGNOSIS — I779 Disorder of arteries and arterioles, unspecified: Secondary | ICD-10-CM

## 2016-12-13 DIAGNOSIS — I2510 Atherosclerotic heart disease of native coronary artery without angina pectoris: Secondary | ICD-10-CM | POA: Diagnosis not present

## 2016-12-13 DIAGNOSIS — I10 Essential (primary) hypertension: Secondary | ICD-10-CM

## 2016-12-13 DIAGNOSIS — I480 Paroxysmal atrial fibrillation: Secondary | ICD-10-CM

## 2016-12-13 NOTE — Progress Notes (Signed)
Chief Complaint  Patient presents with  . Bilateral carotid artery disease    6 month follow up    History of Present Illness: 83 yo WF with history of PAD, CAD s/p CABG 1/10, HTN, hyperlipidemia, former tobacco abuse, paroxysmal atrial fibrillation and COPD who is here today for PV and cardiac follow up. I initially saw her in June of 2010 for PV workup. Her cardiac issues had been followed by Dr. Olevia Perches in the past. She has done well with conservative management of her PAD.  She is known to have CAD and had CABG in 2010 per Dr. Cyndia Bent (LIMA to LAD, SVG to Diagonal, SVG to OM2, SVG to PDA). She was admitted to [facility] in September 2012 with c/o chest pain. She ruled out for an MI and was discharged home. She wore a 21 day event monitor which showed normal sinus rhythm with no SVT, VT, atrial fibrillation.    Incidental finding of echogenic vascularized structures in the bilateral thyroid in 2014. This has been addressed by Dr. Brigitte Pulse in primary care. She has refused biopsy of her thyroid. I saw her in October 2014 and she was c/o chest pains. I arranged a stress myoview but she cancelled the test. She had a bad fall in March 2017 in [place] and broke several bones in her right foot and several toes. ABI April 2017 0.66 on the right and 0.96 on the left. Carotid artery dopplers April 2017 with 0-39% RICA stenosis and 123456 LICA stenosis, stable. She has not tolerated statins or beta blockers in the past.  She is here today for follow up. She has had no exertional chest pains. No SOB. She is still having issues with her right foot, excessive pain in the toes. She is being seen in podiatry. Question or removing the distal segment of her second toe. She has no claudication.    Primary Care Physician: Marton Redwood, MD  Past Medical History:  Diagnosis Date  . Arrhythmia 02/2009   Paroxysmal Fibrillation  . CAD (coronary artery disease)    status post previous anterior wall myocardial  infarction, treated with PTCA dx and subsequent non-ST- elevation myocardial infarction with recent bypass surgery, November 29, 2008.   Marland Kitchen Colitis, ischemic (HCC)    Hx of  . Colon neoplasm    Family history of  . COPD (chronic obstructive pulmonary disease) ([place])   . Former smoker   . Herpes zoster ophthalmicus   . HTN (hypertension)   . Hyperlipidemia   . Iron deficiency   . Postherpetic neuralgia   . PVD (peripheral vascular disease) ([place])    with claudication  . Raynaud phenomenon   . SOB (shortness of breath)    uncertain etiology    Past Surgical History:  Procedure Laterality Date  . APPENDECTOMY    . CORONARY ARTERY BYPASS GRAFT     11/27/2008    Current Outpatient Prescriptions  Medication Sig Dispense Refill  . ALPRAZolam (XANAX) 0.25 MG tablet Take 0.25 mg by mouth every 8 (eight) hours as needed for sleep.    Marland Kitchen amLODipine (NORVASC) 5 MG tablet Take 1 tablet (5 mg total) by mouth 2 (two) times daily. 30 tablet 11  . aspirin 81 MG EC tablet Take 81 mg by mouth daily.      . diclofenac sodium (VOLTAREN) 1 % GEL Apply 2 g topically daily.    Marland Kitchen docusate sodium (COLACE) 100 MG capsule Take 100 mg by mouth as needed (for constipation).     Marland Kitchen  nitroGLYCERIN (NITROSTAT) 0.4 MG SL tablet Place 0.4 mg under the tongue as needed (for chest pain).     . vitamin B-12 (CYANOCOBALAMIN) 1000 MCG tablet Take 1,000 mcg by mouth daily.    . Vitamin D, Cholecalciferol, 1000 units CAPS Take 1,000 Units by mouth daily.    Marland Kitchen warfarin (COUMADIN) 2.5 MG tablet Take 2.5-3.75 mg by mouth See admin instructions. Monday, Wednesday, Friday and Sunday take 1 1/2 tablets. On Tuesday Thursday and Saturday take 1 tablet     No current facility-administered medications for this visit.     Allergies  Allergen Reactions  . Beta Adrenergic Blockers     unknown  . Cardizem [Diltiazem Hcl]     Doesn't recall  . Clopidogrel Bisulfate     REACTION: [place]  . Codeine     Doesn't recall  . Statins     . Tylenol [Acetaminophen] Other (See Comments)    Pt stated this made her sick    Social History   Social History  . Marital status: Widowed    Spouse name: N/A  . Number of children: N/A  . Years of education: N/A   Occupational History  . Homemaker    Social History Main Topics  . Smoking status: Former Research scientist (life sciences)  . Smokeless tobacco: Never Used     Comment: Tobacco abuse for 65 years, one pack per day, stopped in January 2010.   Marland Kitchen Alcohol use Yes     Comment: 2 alcoholic beverages per day.  . Drug use: No  . Sexual activity: Not on file   Other Topics Concern  . Not on file   Social History Narrative  . No narrative on file    Family History  Problem Relation Age of Onset  . Colon cancer Father   . Colon cancer Mother     Review of Systems:  As stated in the HPI and otherwise negative.   BP (!) 164/70   Pulse 70   Ht 5\' 3"  (1.6 m)   Wt 112 lb 6.4 oz (51 kg)   BMI 19.91 kg/m   Physical Examination: General: Well developed, well nourished, NAD  HEENT: OP clear, mucus membranes moist  SKIN: warm, dry. No rashes. Neuro: No focal deficits  Musculoskeletal: Muscle strength 5/5 all ext  Psychiatric: Mood and affect normal  Neck: No JVD, no carotid bruits, no thyromegaly, no lymphadenopathy.  Lungs:Clear bilaterally, no wheezes, rhonci, crackles Cardiovascular: Regular rate and rhythm. No murmurs, gallops or rubs. Abdomen:Soft. Bowel sounds present. Non-tender.  Extremities: No lower extremity edema. Pulses are trace to in the bilateral DP/PT. Right great toe and second toe without erythema or edema. Painful to touch. Diffuse reticular veins over both feet and ankles.   EKG:  EKG is not ordered today. The ekg ordered today demonstrates   Recent Labs: No results found for requested labs within last 8760 hours.   Lipid Panel Followed in primary care    Wt Readings from Last 3 Encounters:  12/13/16 112 lb 6.4 oz (51 kg)  04/18/16 112 lb 12.8 oz (51.2 kg)   02/16/16 110 lb (49.9 kg)     Other studies Reviewed: Additional studies/ records that were reviewed today include: . Review of the above records demonstrates:    Assessment and Plan:   1. PAD:  Her LE dopplers are stable April 2017. She has no pain in her legs with walking. No rest pain or ulcerations. She has pain in her right foot following her fall and injury  last year. I would not consider surgery on the toes without a lower ext angiogram first to define her arterial disease. She is not sure she would even allow surgery on her toes. I will move up her ABI to next week. She will call if she decided to have the toe surgery.    2. HYPERTENSION:  BP has been controlled at home. She is upset today due to lack of parking spaces and traffic. Will continue amlodipine. No changes today.     3. CAD:  No chest pain suggestive of angina. She does not tolerate beta blockers or statins. We discussed an echo today to assess LV function but she does not wish to have this right now. EKG in June 2017 with normal sinus rhythm.   4. CAROTID ARTERY DISEASE:  Mild to moderate bilateral disease by carotid dopplers April 2017. Repeat April 2018.  5. ATRIAL FIBRILLATION: Appears to be in sinus by exam today. Continue anti-coagulation with coumadin. This is followed in primary care.   Current medicines are reviewed at length with the patient today.  The patient does not have concerns regarding medicines.  The following changes have been made:  no change  Labs/ tests ordered today include:   No orders of the defined types were placed in this encounter.   Disposition:   FU with me in 6  months  Signed, Lauree Chandler, MD 12/13/2016 3:52 PM    [facility] [place], [place], [place]  [address] Phone: [phone]; Fax: [phone]

## 2016-12-13 NOTE — Patient Instructions (Addendum)
Medication Instructions:  Your physician recommends that you continue on your current medications as directed. Please refer to the Current Medication list given to you today.   Labwork: none  Testing/Procedures:  Please move ABI's to earlier date (currently scheduled in May)  Follow-Up: Your physician recommends that you schedule a follow-up appointment in: 6 months.  Please call our office in about 3 months to schedule this appointment.     Any Other Special Instructions Will Be Listed Below (If Applicable).     If you need a refill on your cardiac medications before your next appointment, please call your pharmacy.

## 2016-12-20 ENCOUNTER — Ambulatory Visit (HOSPITAL_COMMUNITY)
Admission: RE | Admit: 2016-12-20 | Discharge: 2016-12-20 | Disposition: A | Discharge status: Home / Self Care | Payer: Medicare Other | Source: Ambulatory Visit | Attending: Cardiovascular Disease | Admitting: Cardiovascular Disease

## 2016-12-20 DIAGNOSIS — I7789 Other specified disorders of arteries and arterioles: Secondary | ICD-10-CM | POA: Insufficient documentation

## 2016-12-20 DIAGNOSIS — I739 Peripheral vascular disease, unspecified: Secondary | ICD-10-CM | POA: Insufficient documentation

## 2016-12-20 DIAGNOSIS — Z7901 Long term (current) use of anticoagulants: Secondary | ICD-10-CM | POA: Diagnosis not present

## 2016-12-20 DIAGNOSIS — I480 Paroxysmal atrial fibrillation: Secondary | ICD-10-CM | POA: Diagnosis not present

## 2016-12-21 ENCOUNTER — Telehealth: Payer: Self-pay | Admitting: Cardiovascular Disease

## 2016-12-21 NOTE — Telephone Encounter (Signed)
I spoke with pt and reviewed ABI results with her.  She reports pain in toe has improved since she saw Dr. Angelena Form recently.

## 2016-12-21 NOTE — Telephone Encounter (Signed)
New message     Please call returning [place] call

## 2017-01-25 DIAGNOSIS — I480 Paroxysmal atrial fibrillation: Secondary | ICD-10-CM | POA: Diagnosis not present

## 2017-01-25 DIAGNOSIS — Z7901 Long term (current) use of anticoagulants: Secondary | ICD-10-CM | POA: Diagnosis not present

## 2017-02-08 ENCOUNTER — Ambulatory Visit: Payer: Medicare Other | Admitting: Podiatry

## 2017-02-21 ENCOUNTER — Ambulatory Visit (HOSPITAL_COMMUNITY)
Admission: RE | Admit: 2017-02-21 | Discharge: 2017-02-21 | Disposition: A | Discharge status: Home / Self Care | Payer: Medicare Other | Source: Ambulatory Visit | Attending: Cardiology | Admitting: Cardiology

## 2017-02-21 DIAGNOSIS — I779 Disorder of arteries and arterioles, unspecified: Secondary | ICD-10-CM | POA: Diagnosis not present

## 2017-02-21 DIAGNOSIS — I6523 Occlusion and stenosis of bilateral carotid arteries: Secondary | ICD-10-CM | POA: Insufficient documentation

## 2017-02-21 DIAGNOSIS — I739 Peripheral vascular disease, unspecified: Secondary | ICD-10-CM

## 2017-02-22 ENCOUNTER — Ambulatory Visit (INDEPENDENT_AMBULATORY_CARE_PROVIDER_SITE_OTHER): Payer: Medicare Other | Admitting: Podiatry

## 2017-02-22 DIAGNOSIS — Q828 Other specified congenital malformations of skin: Secondary | ICD-10-CM | POA: Diagnosis not present

## 2017-02-22 DIAGNOSIS — B351 Tinea unguium: Secondary | ICD-10-CM | POA: Diagnosis not present

## 2017-02-22 DIAGNOSIS — M79676 Pain in unspecified toe(s): Secondary | ICD-10-CM | POA: Diagnosis not present

## 2017-02-25 NOTE — Progress Notes (Signed)
She presents today to complain of painful elongated toenails 1 through 5 bilateral. She's also complaining of painful porokeratosis.  Objective: Vital signs are stable she is alert and oriented 3. Pulses are palpable. Neurologic sensorium is intact. Deep tendon reflexes are intact. Toenails are long thick yellow dystrophic onychomycotic multiple porokeratotic lesions plantar aspect of the forefoot and toes.  Assessment: Porokeratosis and ellipsing it onychomycosis.  Plan: Debridement of toenails 1 through 5 bilateral. Debridement of all reactive hyperkeratoses.

## 2017-02-28 DIAGNOSIS — Z7901 Long term (current) use of anticoagulants: Secondary | ICD-10-CM | POA: Diagnosis not present

## 2017-02-28 DIAGNOSIS — I480 Paroxysmal atrial fibrillation: Secondary | ICD-10-CM | POA: Diagnosis not present

## 2017-03-13 ENCOUNTER — Encounter (HOSPITAL_COMMUNITY): Payer: PRIVATE HEALTH INSURANCE

## 2017-04-05 DIAGNOSIS — I480 Paroxysmal atrial fibrillation: Secondary | ICD-10-CM | POA: Diagnosis not present

## 2017-04-05 DIAGNOSIS — Z7901 Long term (current) use of anticoagulants: Secondary | ICD-10-CM | POA: Diagnosis not present

## 2017-05-05 ENCOUNTER — Emergency Department (HOSPITAL_COMMUNITY): Payer: Medicare Other

## 2017-05-05 ENCOUNTER — Encounter (HOSPITAL_COMMUNITY): Payer: Self-pay | Admitting: *Deleted

## 2017-05-05 ENCOUNTER — Inpatient Hospital Stay (HOSPITAL_COMMUNITY)
Admission: EM | Admit: 2017-05-05 | Discharge: 2017-05-09 | DRG: 247 | Disposition: A | Discharge status: Home / Self Care | Payer: Medicare Other | Attending: Cardiovascular Disease | Admitting: Cardiovascular Disease

## 2017-05-05 DIAGNOSIS — Z7982 Long term (current) use of aspirin: Secondary | ICD-10-CM | POA: Diagnosis not present

## 2017-05-05 DIAGNOSIS — I2510 Atherosclerotic heart disease of native coronary artery without angina pectoris: Secondary | ICD-10-CM | POA: Diagnosis not present

## 2017-05-05 DIAGNOSIS — I10 Essential (primary) hypertension: Secondary | ICD-10-CM | POA: Diagnosis not present

## 2017-05-05 DIAGNOSIS — Z87891 Personal history of nicotine dependence: Secondary | ICD-10-CM | POA: Diagnosis not present

## 2017-05-05 DIAGNOSIS — R0602 Shortness of breath: Secondary | ICD-10-CM | POA: Diagnosis not present

## 2017-05-05 DIAGNOSIS — I255 Ischemic cardiomyopathy: Secondary | ICD-10-CM | POA: Diagnosis not present

## 2017-05-05 DIAGNOSIS — I252 Old myocardial infarction: Secondary | ICD-10-CM | POA: Diagnosis not present

## 2017-05-05 DIAGNOSIS — E785 Hyperlipidemia, unspecified: Secondary | ICD-10-CM | POA: Diagnosis not present

## 2017-05-05 DIAGNOSIS — I25810 Atherosclerosis of coronary artery bypass graft(s) without angina pectoris: Secondary | ICD-10-CM | POA: Diagnosis not present

## 2017-05-05 DIAGNOSIS — I429 Cardiomyopathy, unspecified: Secondary | ICD-10-CM | POA: Diagnosis present

## 2017-05-05 DIAGNOSIS — I739 Peripheral vascular disease, unspecified: Secondary | ICD-10-CM | POA: Diagnosis present

## 2017-05-05 DIAGNOSIS — J449 Chronic obstructive pulmonary disease, unspecified: Secondary | ICD-10-CM | POA: Diagnosis present

## 2017-05-05 DIAGNOSIS — Z7901 Long term (current) use of anticoagulants: Secondary | ICD-10-CM | POA: Diagnosis not present

## 2017-05-05 DIAGNOSIS — I5181 Takotsubo syndrome: Secondary | ICD-10-CM | POA: Diagnosis not present

## 2017-05-05 DIAGNOSIS — Z888 Allergy status to other drugs, medicaments and biological substances status: Secondary | ICD-10-CM | POA: Diagnosis not present

## 2017-05-05 DIAGNOSIS — R079 Chest pain, unspecified: Secondary | ICD-10-CM | POA: Diagnosis not present

## 2017-05-05 DIAGNOSIS — Z800 Family history of malignant neoplasm of digestive organs: Secondary | ICD-10-CM | POA: Diagnosis not present

## 2017-05-05 DIAGNOSIS — I509 Heart failure, unspecified: Secondary | ICD-10-CM | POA: Diagnosis not present

## 2017-05-05 DIAGNOSIS — Z955 Presence of coronary angioplasty implant and graft: Secondary | ICD-10-CM

## 2017-05-05 DIAGNOSIS — I480 Paroxysmal atrial fibrillation: Secondary | ICD-10-CM | POA: Diagnosis not present

## 2017-05-05 DIAGNOSIS — I214 Non-ST elevation (NSTEMI) myocardial infarction: Secondary | ICD-10-CM | POA: Diagnosis not present

## 2017-05-05 HISTORY — DX: Unspecified systolic (congestive) heart failure: I50.20

## 2017-05-05 LAB — BASIC METABOLIC PANEL
ANION GAP: 9 (ref 5–15)
BUN: 14 mg/dL (ref 6–20)
CALCIUM: 9.9 mg/dL (ref 8.9–10.3)
CO2: 24 mmol/L (ref 22–32)
Chloride: 108 mmol/L (ref 101–111)
Creatinine, Ser: 0.94 mg/dL (ref 0.44–1.00)
GFR calc Af Amer: >60 mL/min (ref >60)
GFR, EST NON AFRICAN AMERICAN: 53 mL/min — AB (ref >60)
GLUCOSE: 165 mg/dL — AB (ref 65–99)
POTASSIUM: 3.7 mmol/L (ref 3.5–5.1)
SODIUM: 141 mmol/L (ref 135–145)

## 2017-05-05 LAB — CBC
HEMATOCRIT: 46.8 % — AB (ref 36.0–46.0)
Hemoglobin: 15.5 g/dL — ABNORMAL HIGH (ref 12.0–15.0)
MCH: 30.3 pg (ref 26.0–34.0)
MCHC: 33.1 g/dL (ref 30.0–36.0)
MCV: 91.4 fL (ref 78.0–100.0)
Platelets: 295 10*3/uL (ref 150–400)
RBC: 5.12 MIL/uL — ABNORMAL HIGH (ref 3.87–5.11)
RDW: 15 % (ref 11.5–15.5)
WBC: 6.5 10*3/uL (ref 4.0–10.5)

## 2017-05-05 LAB — I-STAT TROPONIN, ED: TROPONIN I, POC: 1.47 ng/mL — AB (ref 0.00–0.08)

## 2017-05-05 LAB — PROTIME-INR
INR: 1.89
Prothrombin Time: 22 seconds — ABNORMAL HIGH (ref 11.4–15.2)

## 2017-05-05 LAB — TROPONIN I: Troponin I: 3.79 ng/mL (ref <0.03)

## 2017-05-05 MED ORDER — ASPIRIN 81 MG PO TBEC: 81.0000 mg | DELAYED_RELEASE_TABLET | Freq: Every day | ORAL | Status: DC

## 2017-05-05 MED ORDER — SODIUM CHLORIDE 0.9% FLUSH: 3.0000 mL | INTRAVENOUS | Status: DC | PRN

## 2017-05-05 MED ORDER — ASPIRIN 300 MG RE SUPP: 300.0000 mg | RECTAL | Status: DC

## 2017-05-05 MED ORDER — ONDANSETRON HCL 4 MG/2ML IJ SOLN: 4.0000 mg | Freq: Four times a day (QID) | INTRAMUSCULAR | Status: DC | PRN

## 2017-05-05 MED ORDER — ASPIRIN 81 MG PO CHEW: 324.0000 mg | CHEWABLE_TABLET | ORAL | Status: DC

## 2017-05-05 MED ORDER — ALPRAZOLAM 0.25 MG PO TABS
0.2500 mg | ORAL_TABLET | Freq: Three times a day (TID) | ORAL | Status: DC | PRN
  Administered 2017-05-06: 0.25 mg via ORAL
  Filled 2017-05-05: qty 1

## 2017-05-05 MED ORDER — AMLODIPINE BESYLATE 5 MG PO TABS
7.5000 mg | ORAL_TABLET | Freq: Every day | ORAL | Status: DC
  Administered 2017-05-06 – 2017-05-09 (×4): 7.5 mg via ORAL
  Filled 2017-05-05 (×4): qty 1

## 2017-05-05 MED ORDER — SODIUM CHLORIDE 0.9 % IV SOLN
250.0000 mL | INTRAVENOUS | Status: DC | PRN
  Administered 2017-05-05: 250 mL via INTRAVENOUS

## 2017-05-05 MED ORDER — DOCUSATE SODIUM 100 MG PO CAPS: 100.0000 mg | ORAL_CAPSULE | Freq: Every day | ORAL | Status: DC | PRN

## 2017-05-05 MED ORDER — ASPIRIN EC 81 MG PO TBEC
81.0000 mg | DELAYED_RELEASE_TABLET | Freq: Every day | ORAL | Status: DC
  Administered 2017-05-06 – 2017-05-08 (×2): 81 mg via ORAL
  Filled 2017-05-05 (×4): qty 1

## 2017-05-05 MED ORDER — MORPHINE SULFATE (PF) 4 MG/ML IV SOLN: 2.0000 mg | Freq: Once | INTRAVENOUS | Status: DC

## 2017-05-05 MED ORDER — NITROGLYCERIN IN D5W 200-5 MCG/ML-% IV SOLN
0.0000 ug/min | INTRAVENOUS | Status: DC
  Administered 2017-05-05: 1.5 ug/min via INTRAVENOUS
  Filled 2017-05-05: qty 250

## 2017-05-05 MED ORDER — SODIUM CHLORIDE 0.9% FLUSH
3.0000 mL | Freq: Two times a day (BID) | INTRAVENOUS | Status: DC
  Administered 2017-05-05 – 2017-05-07 (×3): 3 mL via INTRAVENOUS

## 2017-05-05 MED ORDER — NITROGLYCERIN 0.4 MG SL SUBL
0.4000 mg | SUBLINGUAL_TABLET | SUBLINGUAL | Status: DC | PRN
  Administered 2017-05-09: 0.4 mg via SUBLINGUAL
  Filled 2017-05-05: qty 1

## 2017-05-05 MED ORDER — HEPARIN (PORCINE) IN NACL 100-0.45 UNIT/ML-% IJ SOLN
700.0000 [IU]/h | INTRAMUSCULAR | Status: DC
  Administered 2017-05-05: 600 [IU]/h via INTRAVENOUS
  Administered 2017-05-07: 700 [IU]/h via INTRAVENOUS
  Filled 2017-05-05 (×2): qty 250

## 2017-05-05 MED ORDER — NITROGLYCERIN 0.4 MG SL SUBL: 0.4000 mg | SUBLINGUAL_TABLET | SUBLINGUAL | Status: DC | PRN

## 2017-05-05 MED ORDER — ASPIRIN 81 MG PO CHEW
81.0000 mg | CHEWABLE_TABLET | Freq: Once | ORAL | Status: AC
  Administered 2017-05-05: 81 mg via ORAL
  Filled 2017-05-05: qty 1

## 2017-05-05 NOTE — ED Notes (Signed)
Pt reports she is still having pressure in her "boosom", not pain. This Probation officer has offered her medication for this explain that she has a small amount of morphine ordered, pt refuses stating "its really not that bad".

## 2017-05-05 NOTE — ED Notes (Signed)
Notified dr Dayna Barker of critical lab troponin 1.47

## 2017-05-05 NOTE — ED Provider Notes (Signed)
[facility] DEPT Provider Note   CSN: [id number] Arrival date & time: 05/05/17  1407     History   Chief Complaint Chief Complaint  Patient presents with  . Chest Pain  . Shortness of Breath    HPI [name] is a 83 y.o. female.  Patient is 83 year old female with a history of coronary artery disease status post bypass surgery in 2010, hypertension, COPD and paroxysmal atrial fibrillation on Coumadin who presents with chest pain. She states it started this morning although she was feeling bad over the last couple days. She started having some nausea this morning and then became short of breath. She then started having some tightness to her left chest. It radiates between her shoulder blades. She states right now it's very mild she only has a mild pressure in her chest. She denies any pleuritic pain. It does seem to be worse with exertion. She denies any leg swelling. No fevers. Since she's had the bypass surgery she does notice some intermittent swelling of her left breast that's unchanged. She took 3 nitroglycerin this morning with not really any improvement in symptoms. She also took 3 baby aspirin.      Past Medical History:  Diagnosis Date  . Arrhythmia 02/2009   Paroxysmal Fibrillation  . CAD (coronary artery disease)    status post previous anterior wall myocardial infarction, treated with PTCA dx and subsequent non-ST- elevation myocardial infarction with recent bypass surgery, November 29, 2008.   Marland Kitchen Colitis, ischemic (HCC)    Hx of  . Colon neoplasm    Family history of  . COPD (chronic obstructive pulmonary disease) ([place])   . Former smoker   . Herpes zoster ophthalmicus   . HTN (hypertension)   . Hyperlipidemia   . Iron deficiency   . Postherpetic neuralgia   . PVD (peripheral vascular disease) ([place])    with claudication  . Raynaud phenomenon   . SOB (shortness of breath)    uncertain etiology    Patient Active Problem List   Diagnosis Date Noted  . Other  dysphagia 11/21/2013  . PAD (peripheral artery disease) ([place]) 03/05/2012  . CAD (coronary artery disease) 03/07/2011  . CAROTID ARTERY DISEASE 12/20/2009  . PVD 04/20/2009  . PVD WITH CLAUDICATION 02/24/2009  . ATRIAL FIBRILLATION 02/23/2009  . HYPERKALEMIA 01/18/2009  . HYPERLIPIDEMIA-MIXED 01/17/2009  . HYPERTENSION, BENIGN 01/17/2009  . CAD, AUTOLOGOUS BYPASS GRAFT 01/17/2009  . ADVERSE DRUG REACTION 01/17/2009  . POSTHERPETIC NEURALGIA 02/14/2008  . HERPES ZOSTER OPHTHALMICUS 02/14/2008  . IRON DEFICIENCY 02/14/2008  . COPD 02/14/2008  . ISCHEMIC COLITIS 02/14/2008    Past Surgical History:  Procedure Laterality Date  . APPENDECTOMY    . CORONARY ARTERY BYPASS GRAFT     11/27/2008    OB History    No data available       Home Medications    Prior to Admission medications   Medication Sig Start Date End Date Taking? Authorizing Provider  ALPRAZolam (XANAX) 0.25 MG tablet Take 0.25 mg by mouth every 8 (eight) hours as needed for sleep.    [provider]  amLODipine (NORVASC) 5 MG tablet Take 1 tablet (5 mg total) by mouth 2 (two) times daily. 03/07/11   Burnell Blanks, MD  aspirin 81 MG EC tablet Take 81 mg by mouth daily.      [provider]  diclofenac sodium (VOLTAREN) 1 % GEL Apply 2 g topically daily.    [provider]  docusate sodium (COLACE) 100  MG capsule Take 100 mg by mouth as needed (for constipation).     [provider]  nitroGLYCERIN (NITROSTAT) 0.4 MG SL tablet Place 0.4 mg under the tongue as needed (for chest pain).     [provider]  vitamin B-12 (CYANOCOBALAMIN) 1000 MCG tablet Take 1,000 mcg by mouth daily.    [provider]  Vitamin D, Cholecalciferol, 1000 units CAPS Take 1,000 Units by mouth daily.    [provider]  warfarin (COUMADIN) 2.5 MG tablet Take 2.5-3.75 mg by mouth See admin instructions. Monday, Wednesday, Friday and Sunday take 1 1/2 tablets. On Tuesday  Thursday and Saturday take 1 tablet  3.75 mg on Sun/Mon/Wed/Fri and 2.5 mg on Tues/Thurs/Sat    [provider]    Family History Family History  Problem Relation Age of Onset  . Colon cancer Father   . Colon cancer Mother     Social History Social History  Substance Use Topics  . Smoking status: Former Research scientist (life sciences)  . Smokeless tobacco: Never Used     Comment: Tobacco abuse for 65 years, one pack per day, stopped in January 2010.   Marland Kitchen Alcohol use Yes     Comment: 2 alcoholic beverages per day.     Allergies   Beta adrenergic blockers; Cardizem [diltiazem hcl]; Clopidogrel bisulfate; Codeine; Statins; Tylenol [acetaminophen]; and Other   Review of Systems Review of Systems  Constitutional: Positive for fatigue. Negative for chills, diaphoresis and fever.  HENT: Negative for congestion, rhinorrhea and sneezing.   Eyes: Negative.   Respiratory: Positive for shortness of breath. Negative for cough and chest tightness.   Cardiovascular: Positive for chest pain. Negative for leg swelling.  Gastrointestinal: Positive for nausea. Negative for abdominal pain, blood in stool, diarrhea and vomiting.  Genitourinary: Negative for difficulty urinating, flank pain, frequency and hematuria.  Musculoskeletal: Negative for arthralgias and back pain.  Skin: Negative for rash.  Neurological: Negative for dizziness, speech difficulty, weakness, numbness and headaches.     Physical Exam Updated Vital Signs BP (!) 143/79   Pulse 76   Temp 97.9 F (36.6 C) (Oral)   Resp (!) 25   SpO2 100%   Physical Exam  Constitutional: She is oriented to person, place, and time. She appears well-developed and well-nourished.  HENT:  Head: Normocephalic and atraumatic.  Eyes: Pupils are equal, round, and reactive to light.  Neck: Normal range of motion. Neck supple.  Cardiovascular: Normal rate, regular rhythm and normal heart sounds.   Pulmonary/Chest: Effort normal and breath sounds normal. No  respiratory distress. She has no wheezes. She has no rales. She exhibits no tenderness.  Abdominal: Soft. Bowel sounds are normal. There is no tenderness. There is no rebound and no guarding.  Musculoskeletal: Normal range of motion. She exhibits no edema.  Lymphadenopathy:    She has no cervical adenopathy.  Neurological: She is alert and oriented to person, place, and time.  Skin: Skin is warm and dry. No rash noted.  Psychiatric: She has a normal mood and affect.     ED Treatments / Results  Labs (all labs ordered are listed, but only abnormal results are displayed) Labs Reviewed  BASIC METABOLIC PANEL - Abnormal; Notable for the following:       Result Value   Glucose, Bld 165 (*)    GFR calc non Af Amer 53 (*)    All other components within normal limits  CBC - Abnormal; Notable for the following:    RBC 5.12 (*)  Hemoglobin 15.5 (*)    HCT 46.8 (*)    All other components within normal limits  PROTIME-INR - Abnormal; Notable for the following:    Prothrombin Time 22.0 (*)    All other components within normal limits  I-STAT TROPOININ, ED - Abnormal; Notable for the following:    Troponin i, poc 1.47 (*)    All other components within normal limits  HEPARIN LEVEL (UNFRACTIONATED)    EKG  EKG Interpretation  Date/Time:  Saturday May 05 2017 15:21:32 EDT Ventricular Rate:  75 PR Interval:  164 QRS Duration: 96 QT Interval:  407 QTC Calculation: 455 R Axis:   74 Text Interpretation:  Sinus rhythm Probable lateral infarct, age indeterminate Minimal ST elevation, inferior leads since last tracing no significant change Confirmed by Malvin Johns [phone]) on 05/05/2017 3:32:11 PM       Radiology Dg Chest 2 View  Result Date: 05/05/2017 CLINICAL DATA:  Chest pain. Shortness of breath. Pain between the shoulder blades. EXAM: CHEST  2 VIEW COMPARISON:  07/31/2011 FINDINGS: Heart size and pulmonary vascularity are normal. There is calcification in the thoracic aorta.  There is accentuation of the interstitial markings at the bases, left more than right. This is new since the prior study of 2012. I suspect it represents chronic interstitial disease. The lungs are somewhat hyperinflated with flattening of the diaphragm. CABG. 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 No acute bone abnormality. IMPRESSION: 1. Probable chronic interstitial disease at the lung bases, left greater than right, new since 2012. 2. Aortic atherosclerosis. Electronically Signed   By: Lorriane Shire M.D.   On: 05/05/2017 15:50    Procedures Procedures (including critical care time)  Medications Ordered in ED Medications  morphine 4 MG/ML injection 2 mg (2 mg Intravenous Refused 05/05/17 1553)  heparin ADULT infusion 100 units/mL (25000 units/215mL sodium chloride 0.45%) (not administered)  aspirin chewable tablet 81 mg (81 mg Oral Given 05/05/17 1556)     Initial Impression / Assessment and Plan / ED Course  I have reviewed the triage vital signs and the nursing notes.  Pertinent labs & imaging results that were available during my care of the patient were reviewed by me and considered in my medical decision making (see chart for details).     Patient with a history of coronary artery disease presents with left-sided chest pressure radiating to her shoulder blades although currently she doesn't have any pain between her shoulder blades. She only has very minimal discomfort now. She's taken nitroglycerin without relief. Her troponin is elevated. Her EKG doesn't show acute ischemia. I spoke with the cardiologist on call Dr. Johnsie Cancel who will admit the patient. She was started on heparin.  CRITICAL CARE Performed by: Latronda Spink Total critical care time: 45 minutes Critical care time was exclusive of separately billable procedures and treating other patients. Critical care was necessary to treat or prevent imminent or life-threatening deterioration. Critical care was time  spent personally by me on the following activities: development of treatment plan with patient and/or surrogate as well as nursing, discussions with consultants, evaluation of patient's response to treatment, examination of patient, obtaining history from patient or surrogate, ordering and performing treatments and interventions, ordering and review of laboratory studies, ordering and review of radiographic studies, pulse oximetry and re-evaluation of patient's condition.   Final Clinical Impressions(s) / ED Diagnoses   Final diagnoses:  NSTEMI (non-ST elevated myocardial infarction) [facility])    New Prescriptions New Prescriptions   No medications on file  Malvin Johns, MD 05/05/17 [phone]

## 2017-05-05 NOTE — ED Notes (Signed)
Cardiology at bedside.

## 2017-05-05 NOTE — ED Triage Notes (Signed)
Pt reports having mid chest discomfort and sob. Became  More severe this afternoon. No relief with nitro and asa at home. Reports pain is radiating to her upper back, between her shoulder blade. Reports recent swelling to left breast.

## 2017-05-05 NOTE — H&P (Signed)
Physician History and Physical     Patient ID: [name] MRN: [id number] DOB/AGE: [birth year] 83 y.o. Admit date: 05/05/2017  Primary Care Physician: Marton Redwood, MD Primary Cardiologist: Angelena Form  Active Problems:   * No active hospital problems. *   HPI:  83 y.o. history of CABG by Dr Cyndia Bent in 2010 ( LIMA to LAD SVG to D1 SVG OM2 SVG PDA) . HTN, HL PAF on coumadin  And PVD Notes indicate intolerant to beta blockers and statins Has not felt well for 3 days. SSCP took one nitro Thursday and 3 more today. Nausea and weak feeling. Sensation of left breast fullness and old sternal wires give her sharp pains. In ER continued with some pain but pain free on my arrival She indicates feeling similar to pre cabg. No palpitations dyspnea or syncope. ECG non acute but troponin elevated at 1.47. She is active lives independently and still drives. Son in law is Dr Oren Bracket.  She also has had urinary frequency. Chronic discoloration of her feet from venous disease but no worsening claudication. She took her coumadin last night but INR sub therapeutic today  Review of systems complete and found to be negative unless listed above   Past Medical History:  Diagnosis Date  . Arrhythmia 02/2009   Paroxysmal Fibrillation  . CAD (coronary artery disease)    status post previous anterior wall myocardial infarction, treated with PTCA dx and subsequent non-ST- elevation myocardial infarction with recent bypass surgery, November 29, 2008.   Marland Kitchen Colitis, ischemic (HCC)    Hx of  . Colon neoplasm    Family history of  . COPD (chronic obstructive pulmonary disease) ([place])   . Former smoker   . Herpes zoster ophthalmicus   . HTN (hypertension)   . Hyperlipidemia   . Iron deficiency   . Postherpetic neuralgia   . PVD (peripheral vascular disease) ([place])    with claudication  . Raynaud phenomenon   . SOB (shortness of breath)    uncertain etiology    Family History  Problem Relation Age of Onset  .  Colon cancer Father   . Colon cancer Mother     Social History   Social History  . Marital status: Widowed    Spouse name: N/A  . Number of children: N/A  . Years of education: N/A   Occupational History  . Homemaker    Social History Main Topics  . Smoking status: Former Research scientist (life sciences)  . Smokeless tobacco: Never Used     Comment: Tobacco abuse for 65 years, one pack per day, stopped in January 2010.   Marland Kitchen Alcohol use Yes     Comment: 2 alcoholic beverages per day.  . Drug use: No  . Sexual activity: Not on file   Other Topics Concern  . Not on file   Social History Narrative  . No narrative on file    Past Surgical History:  Procedure Laterality Date  . APPENDECTOMY    . CORONARY ARTERY BYPASS GRAFT     11/27/2008      (Not in a hospital admission)  Physical Exam: Blood pressure (!) 143/79, pulse 76, temperature 97.9 F (36.6 C), temperature source Oral, resp. rate (!) 25, SpO2 100 %.   Affect appropriate Spry but petite elderly female  HEENT: normal Neck supple with no adenopathy JVP normal no bruits no thyromegaly Lungs clear with no wheezing and good diaphragmatic motion Heart:  S1/S2 no murmur, no rub, gallop or click PMI normal Abdomen: benighn, BS  positve, no tenderness, no AAA no bruit.  No HSM or HJR Decreased PV pulses bilateral with femoral bruits No edema Neuro non-focal Skin warm and dry No muscular weakness Small venous varicosities in feet   No current facility-administered medications on file prior to encounter.    Current Outpatient Prescriptions on File Prior to Encounter  Medication Sig Dispense Refill  . ALPRAZolam (XANAX) 0.25 MG tablet Take 0.25 mg by mouth every 8 (eight) hours as needed for sleep.    Marland Kitchen amLODipine (NORVASC) 5 MG tablet Take 1 tablet (5 mg total) by mouth 2 (two) times daily. 30 tablet 11  . aspirin 81 MG EC tablet Take 81 mg by mouth daily.      . diclofenac sodium (VOLTAREN) 1 % GEL Apply 2 g topically daily.    Marland Kitchen  docusate sodium (COLACE) 100 MG capsule Take 100 mg by mouth as needed (for constipation).     . nitroGLYCERIN (NITROSTAT) 0.4 MG SL tablet Place 0.4 mg under the tongue as needed (for chest pain).     . vitamin B-12 (CYANOCOBALAMIN) 1000 MCG tablet Take 1,000 mcg by mouth daily.    . Vitamin D, Cholecalciferol, 1000 units CAPS Take 1,000 Units by mouth daily.    Marland Kitchen warfarin (COUMADIN) 2.5 MG tablet Take 2.5-3.75 mg by mouth See admin instructions. Monday, Wednesday, Friday and Sunday take 1 1/2 tablets. On Tuesday Thursday and Saturday take 1 tablet  3.75 mg on Sun/Mon/Wed/Fri and 2.5 mg on Tues/Thurs/Sat      Labs:   Lab Results  Component Value Date   WBC 6.5 05/05/2017   HGB 15.5 (H) 05/05/2017   HCT 46.8 (H) 05/05/2017   MCV 91.4 05/05/2017   PLT 295 05/05/2017    Recent Labs Lab 05/05/17 1414  NA 141  K 3.7  CL 108  CO2 24  BUN 14  CREATININE 0.94  CALCIUM 9.9  GLUCOSE 165*   Lab Results  Component Value Date   CKTOTAL 80 08/01/2011   CKTOTAL 98 07/31/2011   CKTOTAL 56 08/23/2010   CKMB 3.3 08/01/2011   CKMB 3.3 07/31/2011   CKMB 2.0 08/23/2010   TROPONINI <0.30 08/01/2011   TROPONINI <0.30 07/31/2011   TROPONINI 0.02        NO INDICATION OF MYOCARDIAL INJURY. 08/23/2010     Lab Results  Component Value Date   CHOL (H) 08/23/2010    230        ATP III CLASSIFICATION:  <200     mg/dL   Desirable  200-239  mg/dL   Borderline High  >=240    mg/dL   High          CHOL 178 01/06/2009   CHOL 161 11/23/2008   Lab Results  Component Value Date   HDL 90 08/23/2010   HDL 70.7 01/06/2009   HDL 82.3 11/23/2008   Lab Results  Component Value Date   LDLCALC (H) 08/23/2010    126        Total Cholesterol/HDL:CHD Risk Coronary Heart Disease Risk Table                     Men   Women  1/2 Average Risk   3.4   3.3  Average Risk       5.0   4.4  2 X Average Risk   9.6   7.1  3 X Average Risk  23.4   11.0        Use the calculated Patient  Ratio above and  the CHD Risk Table to determine the patient's CHD Risk.        ATP III CLASSIFICATION (LDL):  <100     mg/dL   Optimal  100-129  mg/dL   Near or Above                    Optimal  130-159  mg/dL   Borderline  160-189  mg/dL   High  >190     mg/dL   Very High   LDLCALC 93 01/06/2009   LDLCALC 67 11/23/2008   Lab Results  Component Value Date   TRIG 71 08/23/2010   TRIG 72 01/06/2009   TRIG 57 11/23/2008   Lab Results  Component Value Date   CHOLHDL 2.6 08/23/2010   CHOLHDL 2.5 CALC 01/06/2009   CHOLHDL 2.0 CALC 11/23/2008   No results found for: LDLDIRECT     Radiology: Dg Chest 2 View  Result Date: 05/05/2017 CLINICAL DATA:  Chest pain. Shortness of breath. Pain between the shoulder blades. EXAM: CHEST  2 VIEW COMPARISON:  07/31/2011 FINDINGS: Heart size and pulmonary vascularity are normal. There is calcification in the thoracic aorta. There is accentuation of the interstitial markings at the bases, left more than right. This is new since the prior study of 2012. I suspect it represents chronic interstitial disease. The lungs are somewhat hyperinflated with flattening of the diaphragm. CABG. 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 No acute bone abnormality. IMPRESSION: 1. Probable chronic interstitial disease at the lung bases, left greater than right, new since 2012. 2. Aortic atherosclerosis. Electronically Signed   By: Lorriane Shire M.D.   On: 05/05/2017 15:50     EKG:  SR low voltage no acute ST changes   ASSESSMENT AND PLAN:  1) SEMI:  Good story and positive enzymes no acute ECG changes. Since INR low discussed with pharmacy will start heparin with no bolus and iv nitro no beta blocker per history Discussed need for cath with daughter and patient willing to proceed on Monday Left wrist may be hard given age and small stature She prefers left groin if cath done from leg 2) Cholesterol defers statin 3) PAF:  NSR INR subRx resume post cath cover with  heparin 4) PVD:  Last ABI  .60 on right 12/20/16 normal on left use left groin for cath Carotid duplex 02/21/17 plaque no stenosis    Signed: Collier Salina Nishan6/23/2018, 4:22 PM

## 2017-05-05 NOTE — ED Notes (Signed)
Unable to move the pt off the f,loor  Floor nurse in the chart

## 2017-05-05 NOTE — Progress Notes (Addendum)
ANTICOAGULATION CONSULT NOTE - Initial Consult  Pharmacy Consult for heparin Indication: chest pain/ACS  Allergies  Allergen Reactions  . Beta Adrenergic Blockers     "Beta Blockers"- Reaction not recalled by patient  . Cardizem [Diltiazem Hcl]     Reaction not recalled by patient  . Clopidogrel Bisulfate Nausea Only and Other (See Comments)    Also "could smell it"  . Codeine Nausea And Vomiting  . Statins     Reaction not recalled by patient  . Tylenol [Acetaminophen] Nausea And Vomiting    Patient Measurements:    Vital Signs: Temp: 97.9 F (36.6 C) (06/23 1415) Temp Source: Oral (06/23 1415) BP: 143/79 (06/23 1530) Pulse Rate: 76 (06/23 1530)  Labs:  Recent Labs  05/05/17 1414  HGB 15.5*  HCT 46.8*  PLT 295  LABPROT 22.0*  INR 1.89  CREATININE 0.94    CrCl cannot be calculated (Unknown ideal weight.).   Medical History: Past Medical History:  Diagnosis Date  . Arrhythmia 02/2009   Paroxysmal Fibrillation  . CAD (coronary artery disease)    status post previous anterior wall myocardial infarction, treated with PTCA dx and subsequent non-ST- elevation myocardial infarction with recent bypass surgery, November 29, 2008.   Marland Kitchen Colitis, ischemic (HCC)    Hx of  . Colon neoplasm    Family history of  . COPD (chronic obstructive pulmonary disease) ([place])   . Former smoker   . Herpes zoster ophthalmicus   . HTN (hypertension)   . Hyperlipidemia   . Iron deficiency   . Postherpetic neuralgia   . PVD (peripheral vascular disease) ([place])    with claudication  . Raynaud phenomenon   . SOB (shortness of breath)    uncertain etiology   Assessment: 37 yoF with a pmh of CAD s/p CABG in 2010, HTN, PAF on warfarin at home presents with chest pain. Baseline INR 1.89, Hgb and pltc wnl. Per patient, last dose of coumadin was last night. No apparent bleeding. Troponins elevated to 1.47. Last documented weight 112 lbs (slightly below IBW of 115lbs). Pharmacy consulted to  start heparin for ACS. Will hold bolus.   Goal of Therapy:  Heparin level 0.3-0.7 units/ml Monitor platelets by anticoagulation protocol: Yes   Plan:  Start Heparin 600 units/hr 0100 heparin level Daily CBC and HL Monitor S/Sx of bleeding F/u Cath results  Dierdre Harness, Cain Sieve, PharmD Clinical Pharmacy Resident [phone] (Pager) 05/05/2017 4:16 PM

## 2017-05-06 LAB — TROPONIN I
Troponin I: 3.36 ng/mL (ref <0.03)
Troponin I: 2.65 ng/mL (ref <0.03)

## 2017-05-06 LAB — MRSA PCR SCREENING: MRSA by PCR: NEGATIVE

## 2017-05-06 LAB — HEPARIN LEVEL (UNFRACTIONATED)
HEPARIN UNFRACTIONATED: 0.51 [IU]/mL (ref 0.30–0.70)
Heparin Unfractionated: 0.21 IU/mL — ABNORMAL LOW (ref 0.30–0.70)
Heparin Unfractionated: 0.43 IU/mL (ref 0.30–0.70)

## 2017-05-06 MED ORDER — ASPIRIN 81 MG PO CHEW
81.0000 mg | CHEWABLE_TABLET | ORAL | Status: AC
  Administered 2017-05-07: 81 mg via ORAL
  Filled 2017-05-06: qty 1

## 2017-05-06 MED ORDER — SODIUM CHLORIDE 0.9 % WEIGHT BASED INFUSION
3.0000 mL/kg/h | INTRAVENOUS | Status: AC
  Administered 2017-05-07: 3 mL/kg/h via INTRAVENOUS

## 2017-05-06 MED ORDER — SODIUM CHLORIDE 0.9 % WEIGHT BASED INFUSION: 1.0000 mL/kg/h | INTRAVENOUS | Status: DC

## 2017-05-06 MED ORDER — SODIUM CHLORIDE 0.9 % IV SOLN: 250.0000 mL | INTRAVENOUS | Status: DC | PRN

## 2017-05-06 MED ORDER — SODIUM CHLORIDE 0.9% FLUSH
3.0000 mL | Freq: Two times a day (BID) | INTRAVENOUS | Status: DC
  Administered 2017-05-07: 3 mL via INTRAVENOUS

## 2017-05-06 MED ORDER — SODIUM CHLORIDE 0.9% FLUSH: 3.0000 mL | INTRAVENOUS | Status: DC | PRN

## 2017-05-06 NOTE — Progress Notes (Signed)
ANTICOAGULATION CONSULT NOTE - Follow Up Consult  Pharmacy Consult for Heparin Indication: NSTEMI  Allergies  Allergen Reactions  . Beta Adrenergic Blockers     "Beta Blockers"- Reaction not recalled by patient  . Cardizem [Diltiazem Hcl]     Reaction not recalled by patient  . Clopidogrel Bisulfate Nausea Only and Other (See Comments)    Also "could smell it"  . Codeine Nausea And Vomiting  . Statins     Reaction not recalled by patient  . Tylenol [Acetaminophen] Nausea And Vomiting  . Other Rash    No dish washing detergent    Patient Measurements: Height: 5\' 2"  (157.5 cm) Weight: 110 lb 3.2 oz (50 kg) IBW/kg (Calculated) : 50.1 Heparin Dosing Weight: 50 kg   Vital Signs: Temp: 98.3 F (36.8 C) (06/24 1941) Temp Source: Oral (06/24 1941) BP: 121/63 (06/24 1941) Pulse Rate: 65 (06/24 1941)  Labs:  Recent Labs  05/05/17 1414 05/05/17 2021 05/06/17 0031 05/06/17 0629 05/06/17 1106 05/06/17 1912  HGB 15.5*  --   --   --   --   --   HCT 46.8*  --   --   --   --   --   PLT 295  --   --   --   --   --   LABPROT 22.0*  --   --   --   --   --   INR 1.89  --   --   --   --   --   HEPARINUNFRC  --   --  0.21*  --  0.43 0.51  CREATININE 0.94  --   --   --   --   --   TROPONINI  --  3.79* 3.36* 2.65*  --   --     Estimated Creatinine Clearance: 33.3 mL/min (by C-G formula based on SCr of 0.94 mg/dL).   Medications:  Scheduled:  . amLODipine  7.5 mg Oral Daily  . aspirin EC  81 mg Oral Daily  .  morphine injection  2 mg Intravenous Once  . sodium chloride flush  3 mL Intravenous Q12H   Infusions:  . sodium chloride 250 mL (05/05/17 2000)  . heparin 700 Units/hr (05/06/17 0204)  . nitroGLYCERIN 10 mcg/min (05/06/17 1002)    Assessment: 83 yo F with hx CAD now with NSTEMI and plans for cardiac cath 6/25.  Pt continues on heparin.  Heparin level = 0.51, therapeutic on 700 units/hr.  No bleeding reported.  Goal of Therapy:  Heparin level 0.3-0.7  units/ml Monitor platelets by anticoagulation protocol: Yes   Plan:  Continue heparin at 700 units/hr. Heparin level and CBC daily Will order INR for AM to assist with post-procedure Coumadin dosing (if/when resumed).  Nicole Cella, RPh Clinical Pharmacist Pager: [phone] Clinical phone for 05/06/2017 from 8:30-4:00 is [phone]. After 4pm, please call Main Rx [phone]) for assistance. 05/06/2017 7:57 PM

## 2017-05-06 NOTE — Progress Notes (Signed)
Progress Note  Patient Name: [name] Date of Encounter: 05/06/2017  Primary Cardiologist: Angelena Form  Subjective   No chest pain wants to ambulate  Inpatient Medications    Scheduled Meds: . amLODipine  7.5 mg Oral Daily  . aspirin  324 mg Oral NOW   Or  . aspirin  300 mg Rectal NOW  . aspirin EC  81 mg Oral Daily  .  morphine injection  2 mg Intravenous Once  . sodium chloride flush  3 mL Intravenous Q12H   Continuous Infusions: . sodium chloride 250 mL (05/05/17 2000)  . heparin 700 Units/hr (05/06/17 0204)  . nitroGLYCERIN 10 mcg/min (05/06/17 1002)   PRN Meds: sodium chloride, ALPRAZolam, docusate sodium, nitroGLYCERIN, ondansetron (ZOFRAN) IV, sodium chloride flush   Vital Signs    Vitals:   05/06/17 0307 05/06/17 0500 05/06/17 0726 05/06/17 1000  BP: 111/62  107/63 116/69  Pulse: (!) 58  64 (!) 57  Resp: 16  16 11   Temp: 97.5 F (36.4 C)  97.9 F (36.6 C)   TempSrc: Oral  Oral   SpO2: 94%  97% 97%  Weight:  50 kg (110 lb 3.2 oz)    Height:        Intake/Output Summary (Last 24 hours) at 05/06/17 1025 Last data filed at 05/06/17 0900  Gross per 24 hour  Intake           222.46 ml  Output                2 ml  Net           220.46 ml   Filed Weights   05/05/17 2006 05/06/17 0500  Weight: 48.9 kg (107 lb 14.4 oz) 50 kg (110 lb 3.2 oz)    Telemetry    NSR no arrhythmia 05/06/2017  - Personally Reviewed  ECG    NSR anterolateral T wave changes this am no ST elevation - Personally Reviewed  Physical Exam  Sitting in chair no distress GEN: No acute distress.   Neck: No JVD Cardiac: RRR, no murmurs, rubs, or gallops. Sternotomy Respiratory: Clear to auscultation bilaterally. GI: Soft, nontender, non-distended  MS: No edema; No deformity. Neuro:  Nonfocal  Psych: Normal affect  Decreased pusles RLE bilateral femoral bruits  Labs    Chemistry Recent Labs Lab 05/05/17 1414  NA 141  K 3.7  CL 108  CO2 24  GLUCOSE 165*  BUN 14    CREATININE 0.94  CALCIUM 9.9  GFRNONAA 53*  GFRAA >60  ANIONGAP 9     Hematology Recent Labs Lab 05/05/17 1414  WBC 6.5  RBC 5.12*  HGB 15.5*  HCT 46.8*  MCV 91.4  MCH 30.3  MCHC 33.1  RDW 15.0  PLT 295    Cardiac Enzymes Recent Labs Lab 05/05/17 2021 05/06/17 0031 05/06/17 0629  TROPONINI 3.79* 3.36* 2.65*    Recent Labs Lab 05/05/17 1452  TROPIPOC 1.47*     BNPNo results for input(s): BNP, PROBNP in the last 168 hours.   DDimer No results for input(s): DDIMER in the last 168 hours.   Radiology    Dg Chest 2 View  Result Date: 05/05/2017 CLINICAL DATA:  Chest pain. Shortness of breath. Pain between the shoulder blades. EXAM: CHEST  2 VIEW COMPARISON:  07/31/2011 FINDINGS: Heart size and pulmonary vascularity are normal. There is calcification in the thoracic aorta. There is accentuation of the interstitial markings at the bases, left more than right. This is new since the  prior study of 2012. I suspect it represents chronic interstitial disease. The lungs are somewhat hyperinflated with flattening of the diaphragm. CABG. 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 No acute bone abnormality. IMPRESSION: 1. Probable chronic interstitial disease at the lung bases, left greater than right, new since 2012. 2. Aortic atherosclerosis. Electronically Signed   By: Lorriane Shire M.D.   On: 05/05/2017 15:50    Cardiac Studies     Patient Profile       83 y.o. history of CABG by Dr Cyndia Bent in 2010 ( LIMA to LAD SVG to D1 SVG OM2 SVG PDA) . HTN, HL PAF on coumadin  And PVD Notes indicate intolerant to beta blockers and statins Admitted with SEMI for cath on Monday   Assessment & Plan    SEMI:  ECG this am suggest may be stenosis in SVG to diagonal Troponin coming down already peak 3.79.  Continue iv heparin and nitro for cath in am likely from left femoral artery. Orders written on add on board  PAF:  Coumadin subRx on admission consider BMS given advanced  age and need for anticoagulation continue heparin   PVD:  ABI .60 on right use left femoral for cath   Note she is Dr Renetta Chalk Fitzgerald's mother in law Anesthesia  Signed, Jenkins Rouge, MD  05/06/2017, 10:25 AM

## 2017-05-06 NOTE — Progress Notes (Signed)
ANTICOAGULATION CONSULT NOTE - Follow Up Consult  Pharmacy Consult for Heparin (warfarin on hold) Indication: chest pain/ACS and atrial fibrillation  Patient Measurements: Height: 5\' 2"  (157.5 cm) Weight: 107 lb 14.4 oz (48.9 kg) IBW/kg (Calculated) : 50.1  Vital Signs: Temp: 97.6 F (36.4 C) (06/23 2353) Temp Source: Oral (06/23 2353) BP: 135/72 (06/23 2353) Pulse Rate: 76 (06/23 2353)  Labs:  Recent Labs  05/05/17 1414 05/05/17 2021 05/06/17 0031  HGB 15.5*  --   --   HCT 46.8*  --   --   PLT 295  --   --   LABPROT 22.0*  --   --   INR 1.89  --   --   HEPARINUNFRC  --   --  0.21*  CREATININE 0.94  --   --   TROPONINI  --  3.79*  --     Estimated Creatinine Clearance: 32.5 mL/min (by C-G formula based on SCr of 0.94 mg/dL).   Assessment: 83 y/o F with chest pain, heparin while warfarin on hold in anticipation of cath, last INR 1.89, initial heparin level low, no issues per RN.   Goal of Therapy:  Heparin level 0.3-0.7 units/ml Monitor platelets by anticoagulation protocol: Yes   Plan:  -Inc heparin to 700 units/hr -1000 HL  Narda Bonds 05/06/2017,1:34 AM

## 2017-05-06 NOTE — Progress Notes (Signed)
ANTICOAGULATION CONSULT NOTE - Follow Up Consult  Pharmacy Consult for Heparin Indication: NSTEMI  Allergies  Allergen Reactions  . Beta Adrenergic Blockers     "Beta Blockers"- Reaction not recalled by patient  . Cardizem [Diltiazem Hcl]     Reaction not recalled by patient  . Clopidogrel Bisulfate Nausea Only and Other (See Comments)    Also "could smell it"  . Codeine Nausea And Vomiting  . Statins     Reaction not recalled by patient  . Tylenol [Acetaminophen] Nausea And Vomiting  . Other Rash    No dish washing detergent    Patient Measurements: Height: 5\' 2"  (157.5 cm) Weight: 110 lb 3.2 oz (50 kg) IBW/kg (Calculated) : 50.1 Heparin Dosing Weight: 50 kg   Vital Signs: Temp: 97.7 F (36.5 C) (06/24 1206) Temp Source: Oral (06/24 1206) BP: 104/61 (06/24 1206) Pulse Rate: 63 (06/24 1206)  Labs:  Recent Labs  05/05/17 1414 05/05/17 2021 05/06/17 0031 05/06/17 0629 05/06/17 1106  HGB 15.5*  --   --   --   --   HCT 46.8*  --   --   --   --   PLT 295  --   --   --   --   LABPROT 22.0*  --   --   --   --   INR 1.89  --   --   --   --   HEPARINUNFRC  --   --  0.21*  --  0.43  CREATININE 0.94  --   --   --   --   TROPONINI  --  3.79* 3.36* 2.65*  --     Estimated Creatinine Clearance: 33.3 mL/min (by C-G formula based on SCr of 0.94 mg/dL).   Medications:  Scheduled:  . amLODipine  7.5 mg Oral Daily  . aspirin  324 mg Oral NOW   Or  . aspirin  300 mg Rectal NOW  . aspirin EC  81 mg Oral Daily  .  morphine injection  2 mg Intravenous Once  . sodium chloride flush  3 mL Intravenous Q12H   Infusions:  . sodium chloride 250 mL (05/05/17 2000)  . heparin 700 Units/hr (05/06/17 0204)  . nitroGLYCERIN 10 mcg/min (05/06/17 1002)    Assessment: 83 yo F with hx CAD now with NSTEMI and plans for cardiac cath 6/25.  Pt continues on heparin.  Heparin level therapeutic on 700 units/hr.    Goal of Therapy:  Heparin level 0.3-0.7 units/ml Monitor platelets by  anticoagulation protocol: Yes   Plan:  Continue heparin at 700 units/hr. Heparin level this PM for confirmation. Heparin level and CBC daily Will order INR for AM to assist with post-procedure Coumadin dosing (if/when resumed).  [employer], Pharm.D., BCPS Clinical Pharmacist Pager: [phone] Clinical phone for 05/06/2017 from 8:30-4:00 is [phone]. After 4pm, please call Main Rx [phone]) for assistance. 05/06/2017 12:44 PM

## 2017-05-07 ENCOUNTER — Encounter (HOSPITAL_COMMUNITY)
Admission: EM | Disposition: A | Discharge status: Home / Self Care | Payer: Self-pay | Source: Home / Self Care | Attending: Cardiovascular Disease

## 2017-05-07 DIAGNOSIS — I214 Non-ST elevation (NSTEMI) myocardial infarction: Secondary | ICD-10-CM

## 2017-05-07 DIAGNOSIS — I2510 Atherosclerotic heart disease of native coronary artery without angina pectoris: Secondary | ICD-10-CM

## 2017-05-07 DIAGNOSIS — I480 Paroxysmal atrial fibrillation: Secondary | ICD-10-CM

## 2017-05-07 HISTORY — PX: CORONARY STENT INTERVENTION: CATH118234

## 2017-05-07 HISTORY — PX: LEFT HEART CATH AND CORS/GRAFTS ANGIOGRAPHY: CATH118250

## 2017-05-07 LAB — PROTIME-INR
INR: 2.14
INR: 1.58
PROTHROMBIN TIME: 19.1 s — AB (ref 11.4–15.2)
Prothrombin Time: 24.2 seconds — ABNORMAL HIGH (ref 11.4–15.2)

## 2017-05-07 LAB — CBC
HCT: 39.4 % (ref 36.0–46.0)
Hemoglobin: 12.8 g/dL (ref 12.0–15.0)
MCH: 29.6 pg (ref 26.0–34.0)
MCHC: 32.5 g/dL (ref 30.0–36.0)
MCV: 91 fL (ref 78.0–100.0)
PLATELETS: 260 10*3/uL (ref 150–400)
RBC: 4.33 MIL/uL (ref 3.87–5.11)
RDW: 15.2 % (ref 11.5–15.5)
WBC: 5.8 10*3/uL (ref 4.0–10.5)

## 2017-05-07 LAB — HEPARIN LEVEL (UNFRACTIONATED): HEPARIN UNFRACTIONATED: 0.49 [IU]/mL (ref 0.30–0.70)

## 2017-05-07 LAB — POCT ACTIVATED CLOTTING TIME: Activated Clotting Time: 499 seconds

## 2017-05-07 SURGERY — LEFT HEART CATH AND CORS/GRAFTS ANGIOGRAPHY
Anesthesia: LOCAL

## 2017-05-07 MED ORDER — NITROGLYCERIN 1 MG/10 ML FOR IR/CATH LAB
INTRA_ARTERIAL | Status: AC
  Filled 2017-05-07: qty 10

## 2017-05-07 MED ORDER — FENTANYL CITRATE (PF) 100 MCG/2ML IJ SOLN
INTRAMUSCULAR | Status: AC
  Filled 2017-05-07: qty 2

## 2017-05-07 MED ORDER — SODIUM CHLORIDE 0.9 % IV SOLN: INTRAVENOUS | Status: DC

## 2017-05-07 MED ORDER — ONDANSETRON HCL 4 MG/2ML IJ SOLN
INTRAMUSCULAR | Status: DC | PRN
  Administered 2017-05-07: 4 mg via INTRAVENOUS

## 2017-05-07 MED ORDER — ONDANSETRON HCL 4 MG/2ML IJ SOLN
INTRAMUSCULAR | Status: AC
  Filled 2017-05-07: qty 2

## 2017-05-07 MED ORDER — TICAGRELOR 90 MG PO TABS
ORAL_TABLET | ORAL | Status: DC | PRN
  Administered 2017-05-07: 180 mg via ORAL

## 2017-05-07 MED ORDER — TICAGRELOR 90 MG PO TABS
ORAL_TABLET | ORAL | Status: AC
  Filled 2017-05-07: qty 1

## 2017-05-07 MED ORDER — LIDOCAINE HCL (PF) 1 % IJ SOLN
INTRAMUSCULAR | Status: DC | PRN
  Administered 2017-05-07: 10 mL

## 2017-05-07 MED ORDER — HEPARIN (PORCINE) IN NACL 2-0.9 UNIT/ML-% IJ SOLN
INTRAMUSCULAR | Status: DC | PRN
  Administered 2017-05-07: 1000 mL

## 2017-05-07 MED ORDER — LIDOCAINE HCL 1 % IJ SOLN
INTRAMUSCULAR | Status: AC
  Filled 2017-05-07: qty 20

## 2017-05-07 MED ORDER — FENTANYL CITRATE (PF) 100 MCG/2ML IJ SOLN
INTRAMUSCULAR | Status: DC | PRN
Start: 2017-05-07 — End: 2017-05-07
  Administered 2017-05-07: 12.5 ug via INTRAVENOUS
  Administered 2017-05-07: 25 ug via INTRAVENOUS

## 2017-05-07 MED ORDER — BIVALIRUDIN BOLUS VIA INFUSION - CUPID
INTRAVENOUS | Status: DC | PRN
  Administered 2017-05-07: 37.65 mg via INTRAVENOUS

## 2017-05-07 MED ORDER — MIDAZOLAM HCL 2 MG/2ML IJ SOLN
INTRAMUSCULAR | Status: DC | PRN
  Administered 2017-05-07 (×2): 1 mg via INTRAVENOUS

## 2017-05-07 MED ORDER — SODIUM CHLORIDE 0.9 % IV SOLN: 250.0000 mL | INTRAVENOUS | Status: DC | PRN

## 2017-05-07 MED ORDER — LABETALOL HCL 5 MG/ML IV SOLN: 10.0000 mg | INTRAVENOUS | Status: DC | PRN

## 2017-05-07 MED ORDER — TICAGRELOR 90 MG PO TABS
90.0000 mg | ORAL_TABLET | Freq: Two times a day (BID) | ORAL | Status: DC
  Administered 2017-05-08 – 2017-05-09 (×3): 90 mg via ORAL
  Filled 2017-05-07 (×3): qty 1

## 2017-05-07 MED ORDER — IOPAMIDOL (ISOVUE-370) INJECTION 76%
INTRAVENOUS | Status: AC
  Filled 2017-05-07: qty 100

## 2017-05-07 MED ORDER — NITROGLYCERIN 1 MG/10 ML FOR IR/CATH LAB
INTRA_ARTERIAL | Status: DC | PRN
  Administered 2017-05-07: 200 ug via INTRACORONARY

## 2017-05-07 MED ORDER — SODIUM CHLORIDE 0.9 % IV SOLN
INTRAVENOUS | Status: DC | PRN
  Administered 2017-05-07: 1.75 mg/kg/h via INTRAVENOUS

## 2017-05-07 MED ORDER — SODIUM CHLORIDE 0.9% FLUSH: 3.0000 mL | Freq: Two times a day (BID) | INTRAVENOUS | Status: DC

## 2017-05-07 MED ORDER — IOPAMIDOL (ISOVUE-370) INJECTION 76%
INTRAVENOUS | Status: DC | PRN
  Administered 2017-05-07: 100 mL via INTRA_ARTERIAL

## 2017-05-07 MED ORDER — SODIUM CHLORIDE 0.9% FLUSH: 3.0000 mL | INTRAVENOUS | Status: DC | PRN

## 2017-05-07 MED ORDER — IOPAMIDOL (ISOVUE-370) INJECTION 76%
INTRAVENOUS | Status: AC
  Filled 2017-05-07: qty 50

## 2017-05-07 MED ORDER — BIVALIRUDIN TRIFLUOROACETATE 250 MG IV SOLR
INTRAVENOUS | Status: AC
  Filled 2017-05-07: qty 250

## 2017-05-07 MED ORDER — MIDAZOLAM HCL 2 MG/2ML IJ SOLN
INTRAMUSCULAR | Status: AC
  Filled 2017-05-07: qty 2

## 2017-05-07 MED ORDER — HEPARIN (PORCINE) IN NACL 2-0.9 UNIT/ML-% IJ SOLN
INTRAMUSCULAR | Status: AC
  Filled 2017-05-07: qty 500

## 2017-05-07 MED ORDER — HYDRALAZINE HCL 20 MG/ML IJ SOLN: 5.0000 mg | INTRAMUSCULAR | Status: DC | PRN

## 2017-05-07 SURGICAL SUPPLY — 21 items
BALLN SAPPHIRE 3.0X15 (BALLOONS) ×2
BALLN SAPPHIRE ~~LOC~~ 3.5X12 (BALLOONS) ×1 IMPLANT
BALLN SAPPHIRE ~~LOC~~ 4.0X15 (BALLOONS) ×1 IMPLANT
BALLOON SAPPHIRE 3.0X15 (BALLOONS) IMPLANT
CATH INFINITI 5 FR 3DRC (CATHETERS) ×1 IMPLANT
CATH INFINITI 5FR MULTPACK ANG (CATHETERS) ×1 IMPLANT
CATH LAUNCHER 6FR EBU3.5 (CATHETERS) ×1 IMPLANT
COVER PRB 48X5XTLSCP FOLD TPE (BAG) IMPLANT
COVER PROBE 5X48 (BAG) ×2
KIT ENCORE 26 ADVANTAGE (KITS) ×1 IMPLANT
KIT HEART LEFT (KITS) ×2 IMPLANT
KIT MICROINTRODUCER STIFF 5F (SHEATH) ×1 IMPLANT
PACK CARDIAC CATHETERIZATION (CUSTOM PROCEDURE TRAY) ×2 IMPLANT
SHEATH PINNACLE 5F 10CM (SHEATH) ×1 IMPLANT
SHEATH PINNACLE 6F 10CM (SHEATH) ×1 IMPLANT
STENT SYNERGY DES 3.5X20 (Permanent Stent) ×1 IMPLANT
SYR MEDRAD MARK V 150ML (SYRINGE) ×2 IMPLANT
TRANSDUCER W/STOPCOCK (MISCELLANEOUS) ×2 IMPLANT
TUBING CIL FLEX 10 FLL-RA (TUBING) ×2 IMPLANT
WIRE COUGAR XT STRL 190CM (WIRE) ×1 IMPLANT
WIRE EMERALD 3MM-J .035X150CM (WIRE) ×1 IMPLANT

## 2017-05-07 NOTE — Progress Notes (Addendum)
Progress Note  Patient Name: [name] Date of Encounter: 05/07/2017  Primary Cardiologist: Angelena Form  Subjective   No chest pain, but mild pressure this morning.   Inpatient Medications    Scheduled Meds: . amLODipine  7.5 mg Oral Daily  . aspirin EC  81 mg Oral Daily  .  morphine injection  2 mg Intravenous Once  . sodium chloride flush  3 mL Intravenous Q12H  . sodium chloride flush  3 mL Intravenous Q12H   Continuous Infusions: . sodium chloride 250 mL (05/05/17 2000)  . sodium chloride    . sodium chloride 1 mL/kg/hr (05/07/17 0727)  . heparin 700 Units/hr (05/07/17 0727)  . nitroGLYCERIN Stopped (05/07/17 0727)   PRN Meds: sodium chloride, sodium chloride, ALPRAZolam, docusate sodium, nitroGLYCERIN, ondansetron (ZOFRAN) IV, sodium chloride flush, sodium chloride flush   Vital Signs    Vitals:   05/06/17 2357 05/07/17 0345 05/07/17 0500 05/07/17 0734  BP: (!) 97/51 (!) 102/58  132/66  Pulse: (!) 57 (!) 53  63  Resp: 18 (!) 28  14  Temp: 97.7 F (36.5 C) 98.1 F (36.7 C)  98.6 F (37 C)  TempSrc: Oral Oral  Oral  SpO2: 92% 94%  93%  Weight:   110 lb 11.2 oz (50.2 kg)   Height:        Intake/Output Summary (Last 24 hours) at 05/07/17 1041 Last data filed at 05/07/17 0400  Gross per 24 hour  Intake              410 ml  Output                1 ml  Net              409 ml   Filed Weights   05/05/17 2006 05/06/17 0500 05/07/17 0500  Weight: 107 lb 14.4 oz (48.9 kg) 110 lb 3.2 oz (50 kg) 110 lb 11.2 oz (50.2 kg)    Telemetry    SR - Personally Reviewed  ECG    SR with deep TWI in anterolateral leads.  - Personally Reviewed  Physical Exam   General: Thin older W female appearing in no acute distress. Head: Normocephalic, atraumatic.  Neck: Supple without bruits, JVD. Lungs:  Resp regular and unlabored, CTA. Heart: RRR, S1, S2, no S3, S4, or 2/6 systolic murmur; no rub. Abdomen: Soft, non-tender, non-distended with normoactive bowel sounds. No  hepatomegaly. No rebound/guarding. No obvious abdominal masses. Extremities: No clubbing, cyanosis, edema. Distal pedal pulses are 2+ bilaterally. Neuro: Alert and oriented X 3. Moves all extremities spontaneously. Psych: Normal affect.  Labs    Chemistry Recent Labs Lab 05/05/17 1414  NA 141  K 3.7  CL 108  CO2 24  GLUCOSE 165*  BUN 14  CREATININE 0.94  CALCIUM 9.9  GFRNONAA 53*  GFRAA >60  ANIONGAP 9     Hematology Recent Labs Lab 05/05/17 1414 05/07/17 0229  WBC 6.5 5.8  RBC 5.12* 4.33  HGB 15.5* 12.8  HCT 46.8* 39.4  MCV 91.4 91.0  MCH 30.3 29.6  MCHC 33.1 32.5  RDW 15.0 15.2  PLT 295 260    Cardiac Enzymes Recent Labs Lab 05/05/17 2021 05/06/17 0031 05/06/17 0629  TROPONINI 3.79* 3.36* 2.65*    Recent Labs Lab 05/05/17 1452  TROPIPOC 1.47*     BNPNo results for input(s): BNP, PROBNP in the last 168 hours.   DDimer No results for input(s): DDIMER in the last 168 hours.    Radiology  Dg Chest 2 View  Result Date: 05/05/2017 CLINICAL DATA:  Chest pain. Shortness of breath. Pain between the shoulder blades. EXAM: CHEST  2 VIEW COMPARISON:  07/31/2011 FINDINGS: Heart size and pulmonary vascularity are normal. There is calcification in the thoracic aorta. There is accentuation of the interstitial markings at the bases, left more than right. This is new since the prior study of 2012. I suspect it represents chronic interstitial disease. The lungs are somewhat hyperinflated with flattening of the diaphragm. CABG. 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 No acute bone abnormality. IMPRESSION: 1. Probable chronic interstitial disease at the lung bases, left greater than right, new since 2012. 2. Aortic atherosclerosis. Electronically Signed   By: Lorriane Shire M.D.   On: 05/05/2017 15:50    Cardiac Studies   N/A  Patient Profile     83 y.o. female with PMH of CABG by Dr Cyndia Bent in 2010 ( LIMA to LAD, SVG to D1, SVG OM2, SVG PDA) HTN, HL  PAF on coumadin and PVD. Notes indicate intolerant to beta blockers and statins. Admitted with [facility]I for cath on Monday   Assessment & Plan    1. NSTEMI: Trop peaked at 3.79 this admission. Presented with several days of chest pain/pressure. No further chest pain, but mild pressure noted. EKG with deep TWI in the anterolateral leads.  -- continue IV heparin/nitro -- planned for cardiac cath today  2. HL: reports being intolerant to statins  3. PAF: SR on telemetry -- coumadin held, on IV heparin. INR 2.1 this morning. Planned groin approach, pt prefers the left. Recheck INR at 2pm   4. PAD: Last ABI 0.60 on the right 2/18.   Signed, Reino Bellis, NP  05/07/2017, 10:41 AM    Patient seen, examined. Available data reviewed. Agree with findings, assessment, and plan as outlined by Reino Bellis, NP-C.   The patient is independently interviewed and examined. My exam today: Elderly woman in NAD. Vitals:   05/07/17 0734 05/07/17 1131  BP: 132/66 138/75  Pulse: 63 72  Resp: 14 15  Temp: 98.6 F (37 C) 98.5 F (36.9 C)   HEENT: normal Neck: JVP - normal Lungs: CTA bilaterally CV: RRR without murmur or gallop Abd: soft, NT, Positive BS, no hepatomegaly Ext: no C/C/E, distal pulses intact and equal Skin: warm/dry no rash  The patient has clear signs of non-ST elevation infarction with ongoing mild chest discomfort at the time of my interview. Her situation is complicated by advanced age, Plavix allergy, and need for oral anticoagulation with paroxysmal atrial fibrillation with chads 2-vasc score of 5. 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 Plans for cardiac catheterization and possible PCI today reviewed with the patient. Considerations might include brilinta and warfarin together for 30 days if a bare-metal stent can be used. Risks, indications, and alternatives to cardiac catheterization and PCI are reviewed with the patient. The patient prefers left femoral  access. She's had a lot of problems with her right leg. Her left radial artery is palpable but feels small and thready. I doubt it is suitable for cannulation. The patient's INR is 2.1 this morning, but with ongoing chest discomfort and elevated enzymes it is best to proceed today.  Sherren Mocha, M.D. 05/07/2017 1:01 PM

## 2017-05-07 NOTE — Interval H&P Note (Signed)
Cath Lab Visit (complete for each Cath Lab visit)  Clinical Evaluation Leading to the Procedure:   ACS: Yes.    Non-ACS:    Anginal Classification: CCS IV  Anti-ischemic medical therapy: Minimal Therapy (1 class of medications)  Non-Invasive Test Results: No non-invasive testing performed  Prior CABG: Previous CABG      History and Physical Interval Note:  05/07/2017 4:46 PM  [name]  has presented today for surgery, with the diagnosis of NSTEMI  The various methods of treatment have been discussed with the patient and family. After consideration of risks, benefits and other options for treatment, the patient has consented to  Procedure(s): Left Heart Cath and Cors/Grafts Angiography (N/A) as a surgical intervention .  The patient's history has been reviewed, patient examined, no change in status, stable for surgery.  I have reviewed the patient's chart and labs.  Questions were answered to the patient's satisfaction.     Sherren Mocha

## 2017-05-07 NOTE — H&P (View-Only) (Signed)
Progress Note  Patient Name: [name] Date of Encounter: 05/07/2017  Primary Cardiologist: Angelena Form  Subjective   No chest pain, but mild pressure this morning.   Inpatient Medications    Scheduled Meds: . amLODipine  7.5 mg Oral Daily  . aspirin EC  81 mg Oral Daily  .  morphine injection  2 mg Intravenous Once  . sodium chloride flush  3 mL Intravenous Q12H  . sodium chloride flush  3 mL Intravenous Q12H   Continuous Infusions: . sodium chloride 250 mL (05/05/17 2000)  . sodium chloride    . sodium chloride 1 mL/kg/hr (05/07/17 0727)  . heparin 700 Units/hr (05/07/17 0727)  . nitroGLYCERIN Stopped (05/07/17 0727)   PRN Meds: sodium chloride, sodium chloride, ALPRAZolam, docusate sodium, nitroGLYCERIN, ondansetron (ZOFRAN) IV, sodium chloride flush, sodium chloride flush   Vital Signs    Vitals:   05/06/17 2357 05/07/17 0345 05/07/17 0500 05/07/17 0734  BP: (!) 97/51 (!) 102/58  132/66  Pulse: (!) 57 (!) 53  63  Resp: 18 (!) 28  14  Temp: 97.7 F (36.5 C) 98.1 F (36.7 C)  98.6 F (37 C)  TempSrc: Oral Oral  Oral  SpO2: 92% 94%  93%  Weight:   110 lb 11.2 oz (50.2 kg)   Height:        Intake/Output Summary (Last 24 hours) at 05/07/17 1041 Last data filed at 05/07/17 0400  Gross per 24 hour  Intake              410 ml  Output                1 ml  Net              409 ml   Filed Weights   05/05/17 2006 05/06/17 0500 05/07/17 0500  Weight: 107 lb 14.4 oz (48.9 kg) 110 lb 3.2 oz (50 kg) 110 lb 11.2 oz (50.2 kg)    Telemetry    SR - Personally Reviewed  ECG    SR with deep TWI in anterolateral leads.  - Personally Reviewed  Physical Exam   General: Thin older W female appearing in no acute distress. Head: Normocephalic, atraumatic.  Neck: Supple without bruits, JVD. Lungs:  Resp regular and unlabored, CTA. Heart: RRR, S1, S2, no S3, S4, or 2/6 systolic murmur; no rub. Abdomen: Soft, non-tender, non-distended with normoactive bowel sounds. No  hepatomegaly. No rebound/guarding. No obvious abdominal masses. Extremities: No clubbing, cyanosis, edema. Distal pedal pulses are 2+ bilaterally. Neuro: Alert and oriented X 3. Moves all extremities spontaneously. Psych: Normal affect.  Labs    Chemistry Recent Labs Lab 05/05/17 1414  NA 141  K 3.7  CL 108  CO2 24  GLUCOSE 165*  BUN 14  CREATININE 0.94  CALCIUM 9.9  GFRNONAA 53*  GFRAA >60  ANIONGAP 9     Hematology Recent Labs Lab 05/05/17 1414 05/07/17 0229  WBC 6.5 5.8  RBC 5.12* 4.33  HGB 15.5* 12.8  HCT 46.8* 39.4  MCV 91.4 91.0  MCH 30.3 29.6  MCHC 33.1 32.5  RDW 15.0 15.2  PLT 295 260    Cardiac Enzymes Recent Labs Lab 05/05/17 2021 05/06/17 0031 05/06/17 0629  TROPONINI 3.79* 3.36* 2.65*    Recent Labs Lab 05/05/17 1452  TROPIPOC 1.47*     BNPNo results for input(s): BNP, PROBNP in the last 168 hours.   DDimer No results for input(s): DDIMER in the last 168 hours.    Radiology  Dg Chest 2 View  Result Date: 05/05/2017 CLINICAL DATA:  Chest pain. Shortness of breath. Pain between the shoulder blades. EXAM: CHEST  2 VIEW COMPARISON:  07/31/2011 FINDINGS: Heart size and pulmonary vascularity are normal. There is calcification in the thoracic aorta. There is accentuation of the interstitial markings at the bases, left more than right. This is new since the prior study of 2012. I suspect it represents chronic interstitial disease. The lungs are somewhat hyperinflated with flattening of the diaphragm. CABG. 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 No acute bone abnormality. IMPRESSION: 1. Probable chronic interstitial disease at the lung bases, left greater than right, new since 2012. 2. Aortic atherosclerosis. Electronically Signed   By: Lorriane Shire M.D.   On: 05/05/2017 15:50    Cardiac Studies   N/A  Patient Profile     83 y.o. female with PMH of CABG by Dr Cyndia Bent in 2010 ( LIMA to LAD, SVG to D1, SVG OM2, SVG PDA) HTN, HL  PAF on coumadin and PVD. Notes indicate intolerant to beta blockers and statins. Admitted with [facility]I for cath on Monday   Assessment & Plan    1. NSTEMI: Trop peaked at 3.79 this admission. Presented with several days of chest pain/pressure. No further chest pain, but mild pressure noted. EKG with deep TWI in the anterolateral leads.  -- continue IV heparin/nitro -- planned for cardiac cath today  2. HL: reports being intolerant to statins  3. PAF: SR on telemetry -- coumadin held, on IV heparin. INR 2.1 this morning. Planned groin approach, pt prefers the left. Recheck INR at 2pm   4. PAD: Last ABI 0.60 on the right 2/18.   Signed, Reino Bellis, NP  05/07/2017, 10:41 AM    Patient seen, examined. Available data reviewed. Agree with findings, assessment, and plan as outlined by Reino Bellis, NP-C.   The patient is independently interviewed and examined. My exam today: Elderly woman in NAD. Vitals:   05/07/17 0734 05/07/17 1131  BP: 132/66 138/75  Pulse: 63 72  Resp: 14 15  Temp: 98.6 F (37 C) 98.5 F (36.9 C)   HEENT: normal Neck: JVP - normal Lungs: CTA bilaterally CV: RRR without murmur or gallop Abd: soft, NT, Positive BS, no hepatomegaly Ext: no C/C/E, distal pulses intact and equal Skin: warm/dry no rash  The patient has clear signs of non-ST elevation infarction with ongoing mild chest discomfort at the time of my interview. Her situation is complicated by advanced age, Plavix allergy, and need for oral anticoagulation with paroxysmal atrial fibrillation with chads 2-vasc score of 5. 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 Plans for cardiac catheterization and possible PCI today reviewed with the patient. Considerations might include brilinta and warfarin together for 30 days if a bare-metal stent can be used. Risks, indications, and alternatives to cardiac catheterization and PCI are reviewed with the patient. The patient prefers left femoral  access. She's had a lot of problems with her right leg. Her left radial artery is palpable but feels small and thready. I doubt it is suitable for cannulation. The patient's INR is 2.1 this morning, but with ongoing chest discomfort and elevated enzymes it is best to proceed today.  Sherren Mocha, M.D. 05/07/2017 1:01 PM

## 2017-05-07 NOTE — Progress Notes (Addendum)
ANTICOAGULATION CONSULT NOTE - Follow Up Consult  Pharmacy Consult for Heparin Indication: NSTEMI, afib  Allergies  Allergen Reactions  . Beta Adrenergic Blockers     "Beta Blockers"- Reaction not recalled by patient  . Cardizem [Diltiazem Hcl]     Reaction not recalled by patient  . Clopidogrel Bisulfate Nausea Only and Other (See Comments)    Also "could smell it"  . Codeine Nausea And Vomiting  . Statins     Reaction not recalled by patient  . Tylenol [Acetaminophen] Nausea And Vomiting  . Other Rash    No dish washing detergent    Patient Measurements: Height: 5\' 2"  (157.5 cm) Weight: 110 lb 11.2 oz (50.2 kg) IBW/kg (Calculated) : 50.1 Heparin Dosing Weight: 50 kg   Vital Signs: Temp: 98.6 F (37 C) (06/25 0734) Temp Source: Oral (06/25 0734) BP: 132/66 (06/25 0734) Pulse Rate: 63 (06/25 0734)  Labs:  Recent Labs  05/05/17 1414 05/05/17 2021  05/06/17 0031 05/06/17 0629 05/06/17 1106 05/06/17 1912 05/07/17 0229  HGB 15.5*  --   --   --   --   --   --  12.8  HCT 46.8*  --   --   --   --   --   --  39.4  PLT 295  --   --   --   --   --   --  260  LABPROT 22.0*  --   --   --   --   --   --  24.2*  INR 1.89  --   --   --   --   --   --  2.14  HEPARINUNFRC  --   --   < > 0.21*  --  0.43 0.51 0.49  CREATININE 0.94  --   --   --   --   --   --   --   TROPONINI  --  3.79*  --  3.36* 2.65*  --   --   --   < > = values in this interval not displayed.  Estimated Creatinine Clearance: 33.3 mL/min (by C-G formula based on SCr of 0.94 mg/dL).   Medications:  Scheduled:  . amLODipine  7.5 mg Oral Daily  . aspirin EC  81 mg Oral Daily  .  morphine injection  2 mg Intravenous Once  . sodium chloride flush  3 mL Intravenous Q12H  . sodium chloride flush  3 mL Intravenous Q12H   Infusions:  . sodium chloride 250 mL (05/05/17 2000)  . sodium chloride    . sodium chloride 1 mL/kg/hr (05/07/17 0727)  . heparin 700 Units/hr (05/07/17 0727)  . nitroGLYCERIN Stopped  (05/07/17 5732)    Assessment: 82 yo F with hx CAD now with NSTEMI. Pt continues on heparin with PTA warfarin for afib on hold. Baseline INR 1.89, now up to 2.14. CBC wnl, no bleed documented. Heparin level remains therapeutic on 700 units/hr. Plans for cardiac cath 6/25.  Goal of Therapy:  Heparin level 0.3-0.7 units/ml Monitor platelets by anticoagulation protocol: Yes   Plan:  Continue heparin at 700 units/hr Heparin level and CBC daily Monitor for s/sx bleeding PTA warfarin on hold Cath for later today   Elicia Lamp, PharmD, BCPS Clinical Pharmacist Rx Phone # for today: [phone] After 3:30PM, please call Main Rx: [phone] 05/07/2017 8:25 AM

## 2017-05-08 ENCOUNTER — Inpatient Hospital Stay (HOSPITAL_COMMUNITY): Payer: Medicare Other

## 2017-05-08 ENCOUNTER — Encounter (HOSPITAL_COMMUNITY): Payer: Self-pay | Admitting: Cardiovascular Disease

## 2017-05-08 DIAGNOSIS — I255 Ischemic cardiomyopathy: Secondary | ICD-10-CM

## 2017-05-08 LAB — BASIC METABOLIC PANEL
Anion gap: 9 (ref 5–15)
BUN: 11 mg/dL (ref 6–20)
CALCIUM: 8.7 mg/dL — AB (ref 8.9–10.3)
CHLORIDE: 111 mmol/L (ref 101–111)
CO2: 20 mmol/L — AB (ref 22–32)
CREATININE: 0.86 mg/dL (ref 0.44–1.00)
GFR calc Af Amer: >60 mL/min (ref >60)
GFR calc non Af Amer: 59 mL/min — ABNORMAL LOW (ref >60)
GLUCOSE: 99 mg/dL (ref 65–99)
Potassium: 3.7 mmol/L (ref 3.5–5.1)
Sodium: 140 mmol/L (ref 135–145)

## 2017-05-08 LAB — CBC
HCT: 38.5 % (ref 36.0–46.0)
Hemoglobin: 12.5 g/dL (ref 12.0–15.0)
MCH: 29.8 pg (ref 26.0–34.0)
MCHC: 32.5 g/dL (ref 30.0–36.0)
MCV: 91.9 fL (ref 78.0–100.0)
Platelets: 245 10*3/uL (ref 150–400)
RBC: 4.19 MIL/uL (ref 3.87–5.11)
RDW: 15.2 % (ref 11.5–15.5)
WBC: 7.4 10*3/uL (ref 4.0–10.5)

## 2017-05-08 LAB — POCT ACTIVATED CLOTTING TIME: Activated Clotting Time: 153 seconds

## 2017-05-08 MED ORDER — WARFARIN - PHARMACIST DOSING INPATIENT: Freq: Every day | Status: DC

## 2017-05-08 MED ORDER — WARFARIN SODIUM 7.5 MG PO TABS
3.7500 mg | ORAL_TABLET | Freq: Once | ORAL | Status: AC
  Administered 2017-05-08: 3.75 mg via ORAL
  Filled 2017-05-08: qty 0.5

## 2017-05-08 MED ORDER — ATROPINE SULFATE 1 MG/10ML IJ SOSY
PREFILLED_SYRINGE | INTRAMUSCULAR | Status: AC
  Filled 2017-05-08: qty 10

## 2017-05-08 NOTE — Progress Notes (Signed)
Left arterial sheath removed at 0036. Vitals stable. Pressure held for 46minutes. Pt developed moderate bruising in left groin area and a palpable hematoma that decreased with added pressure. Site is currently a level 2. Pt aware of the risk of bleeding and  instructed to lay flat and keep the left leg straight. Will continue to monitor area closely.

## 2017-05-08 NOTE — Progress Notes (Addendum)
Progress Note  Patient Name: [name] Date of Encounter: 05/08/2017  Primary Cardiologist: Angelena Form  Subjective   No chest pain or SOB  Inpatient Medications    Scheduled Meds: . amLODipine  7.5 mg Oral Daily  . aspirin EC  81 mg Oral Daily  . atropine      .  morphine injection  2 mg Intravenous Once  . sodium chloride flush  3 mL Intravenous Q12H  . sodium chloride flush  3 mL Intravenous Q12H  . ticagrelor  90 mg Oral BID   Continuous Infusions: . sodium chloride 250 mL (05/05/17 2000)  . sodium chloride     PRN Meds: sodium chloride, sodium chloride, ALPRAZolam, docusate sodium, nitroGLYCERIN, ondansetron (ZOFRAN) IV, sodium chloride flush, sodium chloride flush   Vital Signs    Vitals:   05/08/17 0200 05/08/17 0215 05/08/17 0414 05/08/17 0700  BP: (!) 109/53 (!) 111/54 (!) 114/56 (!) 121/59  Pulse: (!) 56 (!) 57 (!) 59 (!) 57  Resp: 18 17 (!) 21 17  Temp:   98.2 F (36.8 C) 98 F (36.7 C)  TempSrc:   Oral Oral  SpO2: 96% 95% 98% 92%  Weight:   110 lb 9.6 oz (50.2 kg)   Height:        Intake/Output Summary (Last 24 hours) at 05/08/17 0937 Last data filed at 05/08/17 0400  Gross per 24 hour  Intake          1221.25 ml  Output              500 ml  Net           721.25 ml   Filed Weights   05/06/17 0500 05/07/17 0500 05/08/17 0414  Weight: 110 lb 3.2 oz (50 kg) 110 lb 11.2 oz (50.2 kg) 110 lb 9.6 oz (50.2 kg)    Telemetry    Sinus - Personally Reviewed  ECG    NSR, non-specific ST and T wave abnormality- Personally Reviewed  Physical Exam   General: Well developed, well nourished, NAD  HEENT: OP clear, mucus membranes moist  SKIN: warm, dry. No rashes. Neuro: No focal deficits  Musculoskeletal: Muscle strength 5/5 all ext  Psychiatric: Mood and affect normal  Neck: No JVD, no carotid bruits, no thyromegaly, no lymphadenopathy.  Lungs:Clear bilaterally, no wheezes, rhonci, crackles Cardiovascular: Regular rate and rhythm. No murmurs,  gallops or rubs. Abdomen:Soft. Bowel sounds present. Non-tender.  Extremities: No lower extremity edema. Left groin without hematoma    Labs    Chemistry Recent Labs Lab 05/05/17 1414 05/08/17 0331  NA 141 140  K 3.7 3.7  CL 108 111  CO2 24 20*  GLUCOSE 165* 99  BUN 14 11  CREATININE 0.94 0.86  CALCIUM 9.9 8.7*  GFRNONAA 53* 59*  GFRAA >60 >60  ANIONGAP 9 9     Hematology Recent Labs Lab 05/05/17 1414 05/07/17 0229 05/08/17 0331  WBC 6.5 5.8 7.4  RBC 5.12* 4.33 4.19  HGB 15.5* 12.8 12.5  HCT 46.8* 39.4 38.5  MCV 91.4 91.0 91.9  MCH 30.3 29.6 29.8  MCHC 33.1 32.5 32.5  RDW 15.0 15.2 15.2  PLT 295 260 245    Cardiac Enzymes Recent Labs Lab 05/05/17 2021 05/06/17 0031 05/06/17 0629  TROPONINI 3.79* 3.36* 2.65*    Recent Labs Lab 05/05/17 1452  TROPIPOC 1.47*     BNPNo results for input(s): BNP, PROBNP in the last 168 hours.   DDimer No results for input(s): DDIMER in the last  168 hours.   Radiology    No results found.  Cardiac Studies     Patient Profile     83 y.o. female with history of CAD s/p CABG, PAD, PAF admitted with NSTEMI  Assessment & Plan    1. CAD: She is s/p CABG. Admitted with NSTEMI. Cardiac cath on 05/07/17 with occluded vein grafts to OM and Diagonal. Patent grafts to LAD and RCA. Severe native vessel disease. S/p DES placement ostial Circumflex back into the left main. Doing well this am. Will plan Brilinta and coumadin and continue this regimen for at least 3 months given the placement of her Synergy stent. Will restart coumadin today. Will stop ASA before discharge. She has not tolerated statins or beta blockers in the past.   2. OMA:YOKHT this am. Will resume coumadin  3. Cardiomyopathy: LVEF less than 35% by LV gram. Appearance of Takotsubo's cardiomyopathy. May also be component of ischemic cardiomyopathy. Will not start a beta blocker given intolerance in past and bradycardia. Her BP is too low for an Ace-inh. If BP  stable today, consider Ace-inh in am.   Transfer to telemetry unit. Probably d/c home tomorrow.   Signed, Jessica Chandler, MD  05/08/2017, 9:37 AM

## 2017-05-08 NOTE — Discharge Summary (Addendum)
Discharge Summary    Patient ID: [name],  MRN: [id number], DOB/AGE: 1928/01/22 83 y.o.  Admit date: 05/05/2017 Discharge date: 05/09/2017  Primary Care Provider: Marton Redwood Primary Cardiologist: Angelena Form  Discharge Diagnoses    Active Problems:   SEMI (subendocardial myocardial infarction) [facility])   NSTEMI (non-ST elevated myocardial infarction) ([place])   Allergies Allergies  Allergen Reactions  . Beta Adrenergic Blockers     "Beta Blockers"- Reaction not recalled by patient  . Cardizem [Diltiazem Hcl]     Reaction not recalled by patient  . Clopidogrel Bisulfate Nausea Only and Other (See Comments)    Also "could smell it"  . Codeine Nausea And Vomiting  . Statins     Reaction not recalled by patient  . Tylenol [Acetaminophen] Nausea And Vomiting  . Other Rash    No dish washing detergent    Diagnostic Studies/Procedures    LHC: 05/07/17  Conclusion   1. Severe native three-vessel coronary artery disease with severe diffuse heavily calcified stenosis of the LAD, severe ostial stenosis of the left circumflex, and total occlusion of the RCA 2. Status post aortocoronary bypass surgery with continued patency of the LIMA to LAD and saphenous vein graft to PDA and total occlusion of the saphenous vein graft to diagonal and saphenous vein graft OM. 3. Severe segmental contraction abnormality the left ventricle with LVEF less than 35% and typical appearance of acute Takotsubo syndrome 4. Normal LVEDP 5. Successful PCI of the left circumflex back into the left main (protected left main with a patent LIMA-LAD)  Pt on chronic warfarin for PAF, but currently in sinus. Need to consider anticoagulant/antiplatelet options. Might consider a short duration of dual antiplatelet therapy with aspirin and brilinta then resume anticoagulation with warfarin versus using a combination of warfarin and brilinta. The patient is allergic to clopidogrel and I do not think we should put her on  triple therapy because she is a frail 83 year old at high risk of bleeding.   _____________   History of Present Illness     83 y.o. history of CABG by Dr Cyndia Bent in 2010 ( LIMA to LAD SVG to D1 SVG OM2 SVG PDA) . HTN, HL PAF on coumadin and PVD. Notes indicate intolerant to beta blockers and statins. Has not felt well for 3 days. SSCP took one nitro Thursday and 3 more on the day of admission. Nausea and weak feeling in the ED. Sensation of left breast fullness and old sternal wires give her sharp pains. In ER continued with some pain but pain free at the time of exam. She indicated feeling similar to pre cabg. No palpitations dyspnea or syncope. ECG non acute but troponin elevated at 1.47. She is active, lives independently and still drives. Son in law is Dr Oren Bracket. Chronic discoloration of her feet from venous disease but no worsening claudication. She was admitted with plans for cath.   Hospital Course     She was placed on IV heparin and Coumadin held with plans for cardiac cath. Maintained SR throughout admission. Underwent LHC with Dr. Burt Knack noted above with PCI to Lcx back to left main. LVEF was less than 35% with appearance of acute Takotsubo syndrome. She is allergic to plavix. Will plan for Brilinta and Coumadin for 3 months without the addition of ASA. She is intolerant to BB and statins. Coumadin was resumed post cath. Labs showed Cr 0.96 and Hgb 11.6. She was transferred to telemetry on 05/08/17. INR was 1.59 at discharge.  Considered the addition of ACEi/ARB this admission given reduced EF, but blood pressures remains soft. Consider at outpatient follow up. Of note intolerant to BB, and statins. Worked well with cardiac rehab.   She was seen by Dr. Burt Knack and determined stable for discharge home. Follow up in the office has been arranged. Medications are listed below.   General: Well developed, well nourished, female appearing in no acute distress. Head: Normocephalic,  atraumatic.  Neck: Supple without bruits, JVD. Lungs:  Resp regular and unlabored, CTA. Heart: RRR, S1, S2, no S3, S4, or murmur; no rub. Abdomen: Soft, non-tender, non-distended with normoactive bowel sounds. No hepatomegaly. No rebound/guarding. No obvious abdominal masses. Extremities: No clubbing, cyanosis, edema. Distal pedal pulses are 2+ bilaterally. L femoral cath site stable without bruising or hematoma Neuro: Alert and oriented X 3. Moves all extremities spontaneously. Psych: Normal affect.  _____________  Discharge Vitals Blood pressure 123/67, pulse 68, temperature 98.6 F (37 C), temperature source Oral, resp. rate 17, height 5\' 2"  (1.575 m), weight 110 lb (49.9 kg), SpO2 94 %.  Filed Weights   05/08/17 0414 05/08/17 1211 05/09/17 0535  Weight: 110 lb 9.6 oz (50.2 kg) 110 lb 3.2 oz (50 kg) 110 lb (49.9 kg)    Labs & Radiologic Studies    CBC  Recent Labs  05/08/17 0331 05/09/17 0510  WBC 7.4 5.9  HGB 12.5 11.6*  HCT 38.5 35.6*  MCV 91.9 93.0  PLT 245 272   Basic Metabolic Panel  Recent Labs  05/08/17 0331 05/09/17 0510  NA 140 141  K 3.7 3.8  CL 111 111  CO2 20* 24  GLUCOSE 99 98  BUN 11 19  CREATININE 0.86 0.96  CALCIUM 8.7* 8.7*   Liver Function Tests No results for input(s): AST, ALT, ALKPHOS, BILITOT, PROT, ALBUMIN in the last 72 hours. No results for input(s): LIPASE, AMYLASE in the last 72 hours. Cardiac Enzymes No results for input(s): CKTOTAL, CKMB, CKMBINDEX, TROPONINI in the last 72 hours. BNP Invalid input(s): POCBNP D-Dimer No results for input(s): DDIMER in the last 72 hours. Hemoglobin A1C No results for input(s): HGBA1C in the last 72 hours. Fasting Lipid Panel No results for input(s): CHOL, HDL, LDLCALC, TRIG, CHOLHDL, LDLDIRECT in the last 72 hours. Thyroid Function Tests No results for input(s): TSH, T4TOTAL, T3FREE, THYROIDAB in the last 72 hours.  Invalid input(s): FREET3 _____________  Dg Chest 2 View  Result Date:  05/05/2017 CLINICAL DATA:  Chest pain. Shortness of breath. Pain between the shoulder blades. EXAM: CHEST  2 VIEW COMPARISON:  07/31/2011 FINDINGS: Heart size and pulmonary vascularity are normal. There is calcification in the thoracic aorta. There is accentuation of the interstitial markings at the bases, left more than right. This is new since the prior study of 2012. I suspect it represents chronic interstitial disease. The lungs are somewhat hyperinflated with flattening of the diaphragm. CABG. 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 No acute bone abnormality. IMPRESSION: 1. Probable chronic interstitial disease at the lung bases, left greater than right, new since 2012. 2. Aortic atherosclerosis. Electronically Signed   By: Lorriane Shire M.D.   On: 05/05/2017 15:50   Disposition   Pt is being discharged home today in good condition.  Follow-up Plans & Appointments    Follow-up Information    Charlie Pitter, PA-C Follow up on 05/24/2017.   Specialties:  Cardiology, Radiology Why:  at 11am for your follow up appt.  Contact information: [address] [place]  [place]        Marton Redwood, MD Follow up on 05/15/2017.   Specialty:  Internal Medicine Why:  Please have your INR checked.  Contact information: [address] [place] [address] [phone]          Discharge Instructions    (HEART FAILURE PATIENTS) Call MD:  Anytime you have any of the following symptoms: 1) 3 pound weight gain in 24 hours or 5 pounds in 1 week 2) shortness of breath, with or without a dry hacking cough 3) swelling in the hands, feet or stomach 4) if you have to sleep on extra pillows at night in order to breathe.    Complete by:  As directed    Amb Referral to Cardiac Rehabilitation    Complete by:  As directed    Diagnosis:   Coronary Stents PTCA NSTEMI     Call MD for:  redness, tenderness, or signs of infection (pain, swelling, redness,  odor or green/yellow discharge around incision site)    Complete by:  As directed    Diet - low sodium heart healthy    Complete by:  As directed    Discharge instructions    Complete by:  As directed    Groin Site Care Refer to this sheet in the next few weeks. These instructions provide you with information on caring for yourself after your procedure. Your caregiver may also give you more specific instructions. Your treatment has been planned according to current medical practices, but problems sometimes occur. Call your caregiver if you have any problems or questions after your procedure. HOME CARE INSTRUCTIONS You may shower 24 hours after the procedure. Remove the bandage (dressing) and gently wash the site with plain soap and water. Gently pat the site dry.  Do not apply powder or lotion to the site.  Do not sit in a bathtub, swimming pool, or whirlpool for 5 to 7 days.  No bending, squatting, or lifting anything over 10 pounds (4.5 kg) as directed by your caregiver.  Inspect the site at least twice daily.  Do not drive home if you are discharged the same day of the procedure. Have someone else drive you.  You may drive 24 hours after the procedure unless otherwise instructed by your caregiver.  What to expect: Any bruising will usually fade within 1 to 2 weeks.  Blood that collects in the tissue (hematoma) may be painful to the touch. It should usually decrease in size and tenderness within 1 to 2 weeks.  SEEK IMMEDIATE MEDICAL CARE IF: You have unusual pain at the groin site or down the affected leg.  You have redness, warmth, swelling, or pain at the groin site.  You have drainage (other than a small amount of blood on the dressing).  You have chills.  You have a fever or persistent symptoms for more than 72 hours.  You have a fever and your symptoms suddenly get worse.  Your leg becomes pale, cool, tingly, or numb.  You have heavy bleeding from the site. Hold pressure on the  site. Marland Kitchen  PLEASE DO NOT MISS ANY DOSES OF YOUR BRILINTA!!   Increase activity slowly    Complete by:  As directed       Discharge Medications   Current Discharge Medication List    START taking these medications   Details  furosemide (LASIX) 20 MG tablet Take 1 tablet (20 mg total) by mouth daily as needed. For weight gain of 3lbs in  a day, 5lbs in a week. Qty: 30 tablet, Refills: 0    ticagrelor (BRILINTA) 90 MG TABS tablet Take 1 tablet (90 mg total) by mouth 2 (two) times daily. Qty: 60 tablet, Refills: 3      CONTINUE these medications which have NOT CHANGED   Details  ALPRAZolam (XANAX) 0.25 MG tablet Take 0.25 mg by mouth every 8 (eight) hours as needed for anxiety.     amLODipine (NORVASC) 5 MG tablet Take 1 tablet (5 mg total) by mouth 2 (two) times daily. Qty: 30 tablet, Refills: 11   Associated Diagnoses: HTN (hypertension)    docusate sodium (COLACE) 100 MG capsule Take 100 mg by mouth daily as needed (for constipation).     nitroGLYCERIN (NITROSTAT) 0.4 MG SL tablet Place 0.4 mg under the tongue as needed for chest pain.     Vitamin D, Cholecalciferol, 1000 units CAPS Take 1,000 Units by mouth daily.    warfarin (COUMADIN) 2.5 MG tablet Take 2.5-3.75 mg by mouth See admin instructions. 3.75 mg at bedtime on Mon/Wed/Fri/Sat and 2.5 mg on Sun/Tues/Thurs      STOP taking these medications     aspirin 81 MG EC tablet          Aspirin prescribed at discharge?  No: On Coumadin High Intensity Statin Prescribed? (Lipitor 40-80mg  or Crestor 20-40mg ): No: Intolerant Beta Blocker Prescribed? No: Intolerant For EF <40%, was ACEI/ARB Prescribed? No: Consider at outpatient follow up appt ADP Receptor Inhibitor Prescribed? (i.e. Plavix etc.-Includes Medically Managed Patients): Yes For EF <40%, Aldosterone Inhibitor Prescribed? No:  Was EF assessed during THIS hospitalization? Yes Was Cardiac Rehab II ordered? (Included Medically managed Patients): Yes   Outstanding  Labs/Studies   Echo as outpatient.   Duration of Discharge Encounter   Greater than 30 minutes including physician time.  Signed, Reino Bellis NP-C 05/09/2017, 2:46 PM  Patient seen, examined. Available data reviewed. Agree with findings, assessment, and plan as outlined by Reino Bellis, NP-C. The patient is sitting in a chair at the bedside. Her daughter is present. She had some shortness of breath and heaviness in her chest after her morning medicines but feels fine now. On exam, she is an elderly woman in no distress. JVP is normal. Lungs are clear bilaterally. Heart is regular rate and rhythm without murmur or gallop. There is no peripheral edema. The patient appears stable from a cardiac perspective. I suspect her shortness of breath this morning was related to brilinta. We discussed alternatives but considering her Plavix allergy I think it is best to stay on this for now. She could have another trial of Plavix if shortness of breath with brilinta persists. The patient appears stable for discharge. Plan will be to keep her on a combination of brilinta and warfarin for at least 3 months. She should have follow-up echo imaging to assess her LV recovery within 3 months. I do not think she will tolerate a beta blocker or ACE inhibitor because of low blood pressure and previous intolerances.  Sherren Mocha, M.D. 05/09/2017 2:46 PM

## 2017-05-08 NOTE — Progress Notes (Signed)
CARDIAC REHAB PHASE I   PRE:  Rate/Rhythm: 77 SR    BP: sitting 80/46 initially, recheck 98/55    SaO2: 98 RA  MODE:  Ambulation: 280 ft   POST:  Rate/Rhythm: 110 ST with PVCs    BP: sitting 120/59     SaO2: 90 RA  Pt likes to be independent. Uses cane when not at home. She held to my arm walking and I held to her belt. Moderately unsteady at times. C/o SOB with distance, her HR did get up to 110 ST with PVCs. To recliner, tired/slightly SOB after walking. Ed discussed with pt and daughter. She is somewhat receptive. Understands importance of Brilinta/Coumadin. Is considering CRPII and agreed for referral to be sent to [place]. Reviewed NTG and left diet. Gave reminders to watch out for fluid. She likes to walk at [employer] and I encouraged her to continue.  [id number]   [place], ACSM 05/08/2017 11:45 AM

## 2017-05-08 NOTE — Progress Notes (Signed)
ANTICOAGULATION CONSULT NOTE - Initial Consult  Pharmacy Consult for Warfarin Indication: atrial fibrillation  Allergies  Allergen Reactions  . Beta Adrenergic Blockers     "Beta Blockers"- Reaction not recalled by patient  . Cardizem [Diltiazem Hcl]     Reaction not recalled by patient  . Clopidogrel Bisulfate Nausea Only and Other (See Comments)    Also "could smell it"  . Codeine Nausea And Vomiting  . Statins     Reaction not recalled by patient  . Tylenol [Acetaminophen] Nausea And Vomiting  . Other Rash    No dish washing detergent     Labs:  Recent Labs  05/05/17 1414 05/05/17 2021  05/06/17 0031 05/06/17 0629 05/06/17 1106 05/06/17 1912 05/07/17 0229 05/07/17 1446 05/08/17 0331  HGB 15.5*  --   --   --   --   --   --  12.8  --  12.5  HCT 46.8*  --   --   --   --   --   --  39.4  --  38.5  PLT 295  --   --   --   --   --   --  260  --  245  LABPROT 22.0*  --   --   --   --   --   --  24.2* 19.1*  --   INR 1.89  --   --   --   --   --   --  2.14 1.58  --   HEPARINUNFRC  --   --   < > 0.21*  --  0.43 0.51 0.49  --   --   CREATININE 0.94  --   --   --   --   --   --   --   --  0.86  TROPONINI  --  3.79*  --  3.36* 2.65*  --   --   --   --   --   < > = values in this interval not displayed.  Estimated Creatinine Clearance: 36.4 mL/min (by C-G formula based on SCr of 0.86 mg/dL).    Assessment: 83 year old female s/p cath now to resume warfarin for Afib  Goal of Therapy:  INR 2-3 Monitor platelets by anticoagulation protocol: Yes   Plan:  Warfarin 3.75 mg po x 1 dose tonight Daily INR  Thank you Anette Guarneri, PharmD [phone]  05/08/2017,12:58 PM

## 2017-05-08 NOTE — Progress Notes (Addendum)
ACT before sheath removal 153. Verified by Messan RN before removing sheath. ISTAT did not transfer info into electronic Medical Record.

## 2017-05-09 ENCOUNTER — Other Ambulatory Visit: Payer: Self-pay | Admitting: Cardiology

## 2017-05-09 ENCOUNTER — Inpatient Hospital Stay (HOSPITAL_COMMUNITY): Payer: Medicare Other

## 2017-05-09 ENCOUNTER — Encounter (HOSPITAL_COMMUNITY): Payer: Self-pay | Admitting: Cardiology

## 2017-05-09 DIAGNOSIS — I5043 Acute on chronic combined systolic (congestive) and diastolic (congestive) heart failure: Secondary | ICD-10-CM

## 2017-05-09 DIAGNOSIS — I509 Heart failure, unspecified: Secondary | ICD-10-CM

## 2017-05-09 LAB — BASIC METABOLIC PANEL
ANION GAP: 6 (ref 5–15)
BUN: 19 mg/dL (ref 6–20)
CALCIUM: 8.7 mg/dL — AB (ref 8.9–10.3)
CO2: 24 mmol/L (ref 22–32)
CREATININE: 0.96 mg/dL (ref 0.44–1.00)
Chloride: 111 mmol/L (ref 101–111)
GFR calc Af Amer: 60 mL/min — ABNORMAL LOW (ref >60)
GFR, EST NON AFRICAN AMERICAN: 52 mL/min — AB (ref >60)
GLUCOSE: 98 mg/dL (ref 65–99)
Potassium: 3.8 mmol/L (ref 3.5–5.1)
Sodium: 141 mmol/L (ref 135–145)

## 2017-05-09 LAB — CBC
HCT: 35.6 % — ABNORMAL LOW (ref 36.0–46.0)
Hemoglobin: 11.6 g/dL — ABNORMAL LOW (ref 12.0–15.0)
MCH: 30.3 pg (ref 26.0–34.0)
MCHC: 32.6 g/dL (ref 30.0–36.0)
MCV: 93 fL (ref 78.0–100.0)
PLATELETS: 231 10*3/uL (ref 150–400)
RBC: 3.83 MIL/uL — ABNORMAL LOW (ref 3.87–5.11)
RDW: 15.5 % (ref 11.5–15.5)
WBC: 5.9 10*3/uL (ref 4.0–10.5)

## 2017-05-09 LAB — PROTIME-INR
INR: 1.59
Prothrombin Time: 19.1 seconds — ABNORMAL HIGH (ref 11.4–15.2)

## 2017-05-09 LAB — ECHOCARDIOGRAM COMPLETE
Height: 62 in
WEIGHTICAEL: 1760 [oz_av]

## 2017-05-09 MED ORDER — FUROSEMIDE 20 MG PO TABS: 20.0000 mg | ORAL_TABLET | Freq: Every day | ORAL | 0 refills | Status: DC | PRN

## 2017-05-09 MED ORDER — WARFARIN SODIUM 5 MG PO TABS: 5.0000 mg | ORAL_TABLET | Freq: Once | ORAL | Status: DC

## 2017-05-09 MED ORDER — TICAGRELOR 90 MG PO TABS: 90.0000 mg | ORAL_TABLET | Freq: Two times a day (BID) | ORAL | 3 refills | Status: AC

## 2017-05-09 MED ORDER — ISOSORBIDE MONONITRATE ER 30 MG PO TB24
15.0000 mg | ORAL_TABLET | Freq: Every day | ORAL | Status: DC
  Filled 2017-05-09: qty 1

## 2017-05-09 NOTE — Progress Notes (Signed)
CARDIAC REHAB PHASE I   PRE:  Rate/Rhythm: 70 SR    BP: sitting 130/56 manual    SaO2:   MODE:  Ambulation: 200 ft   POST:  Rate/Rhythm: 110 ST with PVCs    BP: sitting 110/64     SaO2: 93 RA  Pt initially reluctant to walk, sts she is tired. Daughter was able to encouraged her. Pt slightly wobbly on feet, wanted to hold to me instead of letting me hold her. Pt c/o SOB, needed to rest after 70 ft. HR up to 110 ST with PVCs. Tired/SOB after walk, to recliner. Discussed daily wts, low sodium, the importance of Brilinta. Pt is not wanting to take Brilinta tonight, sts she needs some time for her body to adjust. Discussed the high importance of Brilinta for her stent. She still seems reluctant and sts she had SOB last night in bed and going to BR. Pt moderately difficult to educate as she thinks she understands more than she does. Struggles to understand the big picture. Encouraged more walking as tolerated. [id number]   [place], ACSM 05/09/2017 10:13 AM

## 2017-05-09 NOTE — Care Management Important Message (Signed)
Important Message  Patient Details  Name: [name] MRN: [id number] Date of Birth: 10-01-1928   Medicare Important Message Given:  Yes    Nathen May 05/09/2017, 11:27 AM

## 2017-05-09 NOTE — Progress Notes (Signed)
  Echocardiogram 2D Echocardiogram has been performed.  [name] L Androw 05/09/2017, 2:03 PM

## 2017-05-09 NOTE — Care Management Note (Signed)
Case Management Note  Patient Details  Name: [name] MRN: [id number] Date of Birth: [birth year]  Subjective/Objective: Pt presented for Chest Pain and SOB-Nstemi. Post cardiac cath with stents. Plan to return home once stable on Brilinta. Pt has Medicare, however no Part D Rx Coverage for medications.                     Action/Plan: CM did provide the patient with 30 day free Brilinta Card. CM did speak with pt in regards to open enrollment to purchase a part D plan. CM did speak with NP in regards to samples at the office- they can provide for the next 2 months. If pt to continue greater than 3 months pt will need patient assistance application and Cardiology to assist with that. Pt declined needing any HH Services at this time. No further needs.   Expected Discharge Date:                  Expected Discharge Plan:  Home/Self Care  In-House Referral:  NA  Discharge planning Services  CM Consult, Medication Assistance  Post Acute Care Choice:  NA Choice offered to:  NA  DME Arranged:  N/A DME Agency:  NA  HH Arranged:  NA HH Agency:  NA  Status of Service:  Completed, signed off  If discussed at [place] of Stay Meetings, dates discussed:    Additional Comments:  Bethena Roys, RN 05/09/2017, 11:48 AM

## 2017-05-09 NOTE — Consult Note (Signed)
           [facility] CM Primary Care Navigator  05/09/2017  [name] [birth year] [id number]   Went to see patient at the bedsideto identify possible discharge needs but she was alreadydischarged.  Patient was discharged home today per staff report.  Primary care provider's office called [name] for [name] notify of patient's discharge and need for post hospital follow-up and transition of care. Reminded regarding patient's health issues needing follow-up as well.  Made aware to refer patient to [facility] care management ifdeemed appropriatefor services.   Noted that provider's office [name], RN) has already attempted a transition of care call for post hospital follow-up.   For questions, please contact:  Dannielle Huh, BSN, RN- [facility] Primary Care Navigator  Telephone: [phone] [name]

## 2017-05-09 NOTE — Progress Notes (Signed)
[place] for Warfarin Indication: atrial fibrillation  Allergies  Allergen Reactions  . Beta Adrenergic Blockers     "Beta Blockers"- Reaction not recalled by patient  . Cardizem [Diltiazem Hcl]     Reaction not recalled by patient  . Clopidogrel Bisulfate Nausea Only and Other (See Comments)    Also "could smell it"  . Codeine Nausea And Vomiting  . Statins     Reaction not recalled by patient  . Tylenol [Acetaminophen] Nausea And Vomiting  . Other Rash    No dish washing detergent     Labs:  Recent Labs  05/06/17 1106 05/06/17 1912  05/07/17 0229 05/07/17 1446 05/08/17 0331 05/09/17 0510  HGB  --   --   < > 12.8  --  12.5 11.6*  HCT  --   --   --  39.4  --  38.5 35.6*  PLT  --   --   --  260  --  245 231  LABPROT  --   --   --  24.2* 19.1*  --  19.1*  INR  --   --   --  2.14 1.58  --  1.59  HEPARINUNFRC 0.43 0.51  --  0.49  --   --   --   CREATININE  --   --   --   --   --  0.86 0.96  < > = values in this interval not displayed.  Estimated Creatinine Clearance: 32.5 mL/min (by C-G formula based on SCr of 0.96 mg/dL).    Assessment: 83 year old female s/p cath now to resume warfarin for Afib INR today - 1.59  Goal of Therapy:  INR 2-3 Monitor platelets by anticoagulation protocol: Yes   Plan:  Warfarin 5 mg po x 1 dose tonight (little boost) Daily INR  Thank you Anette Guarneri, PharmD [phone]  05/09/2017,10:34 AM

## 2017-05-10 ENCOUNTER — Telehealth: Payer: Self-pay

## 2017-05-10 NOTE — Telephone Encounter (Signed)
Outreach made to Pt for TCM follow up.  Call went to VM.  Left message requesting call back.  This nurse name and # left for call back.  Will cont to monitor.

## 2017-05-11 ENCOUNTER — Emergency Department (HOSPITAL_COMMUNITY): Payer: Medicare Other

## 2017-05-11 ENCOUNTER — Encounter (HOSPITAL_COMMUNITY): Payer: Self-pay

## 2017-05-11 ENCOUNTER — Telehealth (HOSPITAL_COMMUNITY): Payer: Self-pay

## 2017-05-11 ENCOUNTER — Inpatient Hospital Stay (HOSPITAL_COMMUNITY)
Admission: EM | Admit: 2017-05-11 | Discharge: 2017-05-14 | DRG: 281 | Disposition: A | Discharge status: Home / Self Care | Payer: Medicare Other | Attending: Cardiovascular Disease | Admitting: Cardiovascular Disease

## 2017-05-11 ENCOUNTER — Telehealth: Payer: Self-pay | Admitting: Cardiovascular Disease

## 2017-05-11 DIAGNOSIS — I700 Atherosclerosis of aorta: Secondary | ICD-10-CM | POA: Diagnosis present

## 2017-05-11 DIAGNOSIS — Z955 Presence of coronary angioplasty implant and graft: Secondary | ICD-10-CM

## 2017-05-11 DIAGNOSIS — R0789 Other chest pain: Secondary | ICD-10-CM | POA: Diagnosis not present

## 2017-05-11 DIAGNOSIS — I110 Hypertensive heart disease with heart failure: Secondary | ICD-10-CM | POA: Diagnosis not present

## 2017-05-11 DIAGNOSIS — J439 Emphysema, unspecified: Secondary | ICD-10-CM | POA: Diagnosis present

## 2017-05-11 DIAGNOSIS — E785 Hyperlipidemia, unspecified: Secondary | ICD-10-CM | POA: Diagnosis present

## 2017-05-11 DIAGNOSIS — I25810 Atherosclerosis of coronary artery bypass graft(s) without angina pectoris: Secondary | ICD-10-CM | POA: Diagnosis not present

## 2017-05-11 DIAGNOSIS — I7300 Raynaud's syndrome without gangrene: Secondary | ICD-10-CM | POA: Diagnosis present

## 2017-05-11 DIAGNOSIS — I255 Ischemic cardiomyopathy: Secondary | ICD-10-CM | POA: Diagnosis present

## 2017-05-11 DIAGNOSIS — Z87891 Personal history of nicotine dependence: Secondary | ICD-10-CM

## 2017-05-11 DIAGNOSIS — I480 Paroxysmal atrial fibrillation: Secondary | ICD-10-CM | POA: Diagnosis present

## 2017-05-11 DIAGNOSIS — I252 Old myocardial infarction: Secondary | ICD-10-CM

## 2017-05-11 DIAGNOSIS — R0602 Shortness of breath: Secondary | ICD-10-CM | POA: Diagnosis not present

## 2017-05-11 DIAGNOSIS — R001 Bradycardia, unspecified: Secondary | ICD-10-CM | POA: Diagnosis present

## 2017-05-11 DIAGNOSIS — I214 Non-ST elevation (NSTEMI) myocardial infarction: Secondary | ICD-10-CM | POA: Diagnosis present

## 2017-05-11 DIAGNOSIS — E876 Hypokalemia: Secondary | ICD-10-CM | POA: Diagnosis present

## 2017-05-11 DIAGNOSIS — Z7901 Long term (current) use of anticoagulants: Secondary | ICD-10-CM

## 2017-05-11 DIAGNOSIS — Z800 Family history of malignant neoplasm of digestive organs: Secondary | ICD-10-CM

## 2017-05-11 DIAGNOSIS — I5043 Acute on chronic combined systolic (congestive) and diastolic (congestive) heart failure: Secondary | ICD-10-CM | POA: Diagnosis not present

## 2017-05-11 HISTORY — DX: Pneumonia, unspecified organism: J18.9

## 2017-05-11 HISTORY — DX: Acute on chronic diastolic (congestive) heart failure: I50.33

## 2017-05-11 HISTORY — DX: ST elevation (STEMI) myocardial infarction of unspecified site: I21.3

## 2017-05-11 HISTORY — DX: Angina pectoris, unspecified: I20.9

## 2017-05-11 HISTORY — DX: Anxiety disorder, unspecified: F41.9

## 2017-05-11 LAB — I-STAT TROPONIN, ED
Troponin i, poc: 0.24 ng/mL (ref 0.00–0.08)
Troponin i, poc: 0.24 ng/mL (ref 0.00–0.08)

## 2017-05-11 LAB — BASIC METABOLIC PANEL
ANION GAP: 8 (ref 5–15)
BUN: 17 mg/dL (ref 6–20)
CO2: 22 mmol/L (ref 22–32)
Calcium: 9.7 mg/dL (ref 8.9–10.3)
Chloride: 110 mmol/L (ref 101–111)
Creatinine, Ser: 0.93 mg/dL (ref 0.44–1.00)
GFR, EST NON AFRICAN AMERICAN: 54 mL/min — AB (ref >60)
Glucose, Bld: 118 mg/dL — ABNORMAL HIGH (ref 65–99)
POTASSIUM: 3.6 mmol/L (ref 3.5–5.1)
SODIUM: 140 mmol/L (ref 135–145)

## 2017-05-11 LAB — CBC
HEMATOCRIT: 38.8 % (ref 36.0–46.0)
HEMOGLOBIN: 13 g/dL (ref 12.0–15.0)
MCH: 30.7 pg (ref 26.0–34.0)
MCHC: 33.5 g/dL (ref 30.0–36.0)
MCV: 91.5 fL (ref 78.0–100.0)
Platelets: 275 10*3/uL (ref 150–400)
RBC: 4.24 MIL/uL (ref 3.87–5.11)
RDW: 15.5 % (ref 11.5–15.5)
WBC: 6 10*3/uL (ref 4.0–10.5)

## 2017-05-11 LAB — BRAIN NATRIURETIC PEPTIDE: B Natriuretic Peptide: 2135.9 pg/mL — ABNORMAL HIGH (ref 0.0–100.0)

## 2017-05-11 LAB — PROTIME-INR
INR: 1.7
PROTHROMBIN TIME: 20.2 s — AB (ref 11.4–15.2)

## 2017-05-11 MED ORDER — ALPRAZOLAM 0.25 MG PO TABS: 0.2500 mg | ORAL_TABLET | Freq: Three times a day (TID) | ORAL | Status: DC | PRN

## 2017-05-11 MED ORDER — AMLODIPINE BESYLATE 5 MG PO TABS
5.0000 mg | ORAL_TABLET | Freq: Two times a day (BID) | ORAL | Status: DC
  Administered 2017-05-12 – 2017-05-14 (×5): 5 mg via ORAL
  Filled 2017-05-11 (×5): qty 1

## 2017-05-11 MED ORDER — LISINOPRIL 2.5 MG PO TABS
2.5000 mg | ORAL_TABLET | Freq: Every day | ORAL | Status: DC
  Administered 2017-05-11 – 2017-05-14 (×4): 2.5 mg via ORAL
  Filled 2017-05-11 (×4): qty 1

## 2017-05-11 MED ORDER — DOCUSATE SODIUM 100 MG PO CAPS: 100.0000 mg | ORAL_CAPSULE | Freq: Every day | ORAL | Status: DC | PRN

## 2017-05-11 MED ORDER — WARFARIN SODIUM 3 MG PO TABS
6.0000 mg | ORAL_TABLET | Freq: Once | ORAL | Status: AC
  Administered 2017-05-11: 6 mg via ORAL
  Filled 2017-05-11: qty 2

## 2017-05-11 MED ORDER — ONDANSETRON HCL 4 MG/2ML IJ SOLN: 4.0000 mg | Freq: Four times a day (QID) | INTRAMUSCULAR | Status: DC | PRN

## 2017-05-11 MED ORDER — FUROSEMIDE 10 MG/ML IJ SOLN
40.0000 mg | Freq: Once | INTRAMUSCULAR | Status: AC
  Administered 2017-05-11: 40 mg via INTRAVENOUS
  Filled 2017-05-11: qty 4

## 2017-05-11 MED ORDER — FUROSEMIDE 10 MG/ML IJ SOLN
40.0000 mg | Freq: Two times a day (BID) | INTRAMUSCULAR | Status: DC
  Administered 2017-05-12 – 2017-05-13 (×3): 40 mg via INTRAVENOUS
  Filled 2017-05-11 (×3): qty 4

## 2017-05-11 MED ORDER — TICAGRELOR 90 MG PO TABS
90.0000 mg | ORAL_TABLET | Freq: Two times a day (BID) | ORAL | Status: DC
  Administered 2017-05-11 – 2017-05-14 (×6): 90 mg via ORAL
  Filled 2017-05-11 (×6): qty 1

## 2017-05-11 MED ORDER — VITAMIN D 1000 UNITS PO TABS
1000.0000 [IU] | ORAL_TABLET | Freq: Every day | ORAL | Status: DC
  Administered 2017-05-12 – 2017-05-14 (×3): 1000 [IU] via ORAL
  Filled 2017-05-11 (×3): qty 1

## 2017-05-11 MED ORDER — NITROGLYCERIN 0.4 MG SL SUBL: 0.4000 mg | SUBLINGUAL_TABLET | SUBLINGUAL | Status: DC | PRN

## 2017-05-11 MED ORDER — WARFARIN - PHARMACIST DOSING INPATIENT: Freq: Every day | Status: DC

## 2017-05-11 MED ORDER — FUROSEMIDE 20 MG PO TABS: 20.0000 mg | ORAL_TABLET | Freq: Every day | ORAL | Status: DC

## 2017-05-11 MED ORDER — VITAMIN D (CHOLECALCIFEROL) 25 MCG (1000 UT) PO CAPS: 1000.0000 [IU] | ORAL_CAPSULE | Freq: Every day | ORAL | Status: DC

## 2017-05-11 NOTE — Telephone Encounter (Signed)
Call placed to patient to follow up after discharge from [facility] on 05/09/2017.  Call was answered by patient's son Clair Gulling.  Per Clair Gulling patient awoke around 2:30 am with a sharp burning pain in her right lower back.  She took one nitro at that time.  She went downstairs and fell asleep on her couch.  She slept until morning when she awoke with a strong pressure on her chest which she described as bricks on her chest.  She did not take another nitro.  At this time patient states the pressure has subsided a little.  Patient also attests to worsening SOB.  Per son, Pt had been getting steadily better all week and now patient feels like she is getting worse.  Spoke with Dr. Angelena Form nurse.  Advise for Pt to call EMS as soon as possible.  Trinidad Curet that we recommend he call EMS and have mother go to ER.  Clair Gulling questioned if Pt should go to ER or just come to office.  Notified Clair Gulling that we recommend ER for his mother.  Clair Gulling indicates understanding.

## 2017-05-11 NOTE — Telephone Encounter (Signed)
Patient insurances are active and benefits verified. Patient has Medicare A/B and [employer] - medicare supplement. Medicare A/B - no co-payment, deductible $183.00/$183.00 has been met, 20% co-insurance, no out of pocket, no pre-authorization and no limit on visit. Passport/reference [phone]. [employer] - medicare supplement - no co-payment, no deductible, no co-insurance, no out of pocket and no pre-authorization. Follow medicare guidelines. Passport/reference [phone].  Patient will be contacted and scheduled after their follow up appointment with the cardiologist on 05/24/17, upon review by [facility] RN navigator.

## 2017-05-11 NOTE — ED Notes (Signed)
Pt ambulated with 1 person assist from bed to bathroom in room. Pt sob during ambulation. Pt returned to bed and SpO2 level 88. Upon laying down SpO2 returned to 95 on 2 liters

## 2017-05-11 NOTE — H&P (Signed)
Cardiology Admission History and Physical:   Patient ID: [name]; [id number]; 06-25-1928   Admission date: 05/11/2017  Primary Care Provider: Marton Redwood, MD Primary Cardiologist: Dr. Angelena Form  Patient Profile:   [name] is a 83 y.o. female with a history of CABG by Dr Cyndia Bent in 2010 ( LIMA to LAD SVG to D1 SVG OM2 SVG PDA),  HTN, HL PAF on coumadin, PVD, intolerance to statin & BB and recent admission for STEMI presented with CP and SOB.   Admitted 6/23-6/27 with STEMI. She was placed on IV heparin and Coumadin held with plans for cardiac cath. Maintained SR throughout admission. Peak of troponin 3.79. Cardiac cath on 05/07/17 with occluded vein grafts to OM and Diagonal. Patent grafts to LAD and RCA. Severe native vessel disease. S/p DES placement ostial Circumflex back into the left main. LVEF was less than 35% with appearance of acute Takotsubo syndrome. Plan will be to keep her on a combination of brilinta and warfarin for at least 3 months. She should have follow-up echo imaging to assess her LV recovery within 3 months. Unable to add ACE/ARB due to soft BP.   History of Present Illness:   [name] was doing well up until last night when she woke up with his severe shortness of breath around 2:30 AM. Later she developed a substernal chest pressure. He took sublingual nitroglycerin x 1. However, her pain persisted and eventually fall asleep around 3:30 AM. She woke up around 9:00 this morning. Then she developed a 10 out of 10 chest pressure with radiation to back and shortness of breath. Due to ongoing symptoms she presented to ER for further evaluation. Currently complained of 6 out of 10 chest pressure with shortness of breath. Both are equal. Her shortness of breath exacerbated by laying down. She hasn't took any when necessary Lasix.  In ER workup revealed BNP significantly elevated at 2135. Point-of-care troponin 0.24 x 2. Serum creatinine and electrolytes are normal. Chest x-ray  showed mild interstitial infiltrate. EKG shows sinus rhythm at rate of 90 bpm, PAC and nonspecific T-wave abnormality (similar to prior EKG post-PCI)  - personally reviewed.   Past Medical History:  Diagnosis Date  . Arrhythmia 02/2009   Paroxysmal Fibrillation  . CAD (coronary artery disease)    status post previous anterior wall myocardial infarction, treated with PTCA dx and subsequent non-ST- elevation myocardial infarction with recent bypass surgery, November 29, 2008.  05/07/17 PCI-->DES to pLCx, EF 35%  . Colitis, ischemic (HCC)    Hx of  . Colon neoplasm    Family history of  . COPD (chronic obstructive pulmonary disease) ([place])   . Former smoker   . Herpes zoster ophthalmicus   . HTN (hypertension)   . Hyperlipidemia   . Iron deficiency   . Postherpetic neuralgia   . PVD (peripheral vascular disease) ([place])    with claudication  . Raynaud phenomenon   . SOB (shortness of breath)    uncertain etiology  . Systolic heart failure [facility])     Past Surgical History:  Procedure Laterality Date  . APPENDECTOMY    . CORONARY ARTERY BYPASS GRAFT     11/27/2008  . CORONARY STENT INTERVENTION N/A 05/07/2017   Procedure: Coronary Stent Intervention;  Surgeon: Sherren Mocha, MD;  Location: [place] [facility];  Service: Cardiovascular;  Laterality: N/A;  . LEFT HEART CATH AND CORS/GRAFTS ANGIOGRAPHY N/A 05/07/2017   Procedure: Left Heart Cath and Cors/Grafts Angiography;  Surgeon: Sherren Mocha, MD;  Location:  [place] IN[facility];  Service: Cardiovascular;  Laterality: N/A;     Medications Prior to Admission: Prior to Admission medications   Medication Sig Start Date End Date Taking? Authorizing Provider  ALPRAZolam (XANAX) 0.25 MG tablet Take 0.25 mg by mouth every 8 (eight) hours as needed for anxiety.     [provider]  amLODipine (NORVASC) 5 MG tablet Take 1 tablet (5 mg total) by mouth 2 (two) times daily. 03/07/11   Burnell Blanks, MD  docusate sodium (COLACE)  100 MG capsule Take 100 mg by mouth daily as needed (for constipation).     [provider]  furosemide (LASIX) 20 MG tablet Take 1 tablet (20 mg total) by mouth daily as needed. For weight gain of 3lbs in a day, 5lbs in a week. 05/09/17   Cheryln Manly, NP  nitroGLYCERIN (NITROSTAT) 0.4 MG SL tablet Place 0.4 mg under the tongue as needed for chest pain.     [provider]  ticagrelor (BRILINTA) 90 MG TABS tablet Take 1 tablet (90 mg total) by mouth 2 (two) times daily. 05/09/17   Cheryln Manly, NP  Vitamin D, Cholecalciferol, 1000 units CAPS Take 1,000 Units by mouth daily.    [provider]  warfarin (COUMADIN) 2.5 MG tablet Take 2.5-3.75 mg by mouth See admin instructions. 3.75 mg at bedtime on Mon/Wed/Fri/Sat and 2.5 mg on Sun/Tues/Thurs    [provider]     Allergies:    Allergies  Allergen Reactions  . Beta Adrenergic Blockers     "Beta Blockers"- Reaction not recalled by patient  . Cardizem [Diltiazem Hcl]     Reaction not recalled by patient  . Clopidogrel Bisulfate Nausea Only and Other (See Comments)    Also "could smell it"  . Codeine Nausea And Vomiting  . Statins     Reaction not recalled by patient  . Tylenol [Acetaminophen] Nausea And Vomiting  . Other Rash    No dish washing detergent    Social History:   Social History   Social History  . Marital status: Widowed    Spouse name: N/A  . Number of children: N/A  . Years of education: N/A   Occupational History  . Homemaker    Social History Main Topics  . Smoking status: Former Research scientist (life sciences)  . Smokeless tobacco: Never Used     Comment: Tobacco abuse for 65 years, one pack per day, stopped in January 2010.   Marland Kitchen Alcohol use Yes     Comment: 2 alcoholic beverages per day.  . Drug use: No  . Sexual activity: Not on file   Other Topics Concern  . Not on file   Social History Narrative  . No narrative on file    Family History:   The patient's family history  includes Colon cancer in her father and mother.    ROS:  Please see the history of present illness.  All other ROS reviewed and negative.     Physical Exam/Data:   Vitals:   05/11/17 1445 05/11/17 1500 05/11/17 1515 05/11/17 1534  BP: 140/73 (!) 145/78 127/62 129/64  Pulse: 76 76 63 70  Resp: (!) 22 17 19 16   Temp:      TempSrc:      SpO2: 93% 95% 91% 95%   No intake or output data in the 24 hours ending 05/11/17 1601 There were no vitals filed for this visit. There is no height or weight on file to calculate BMI.  General:  Thin frail woman in in no acute distress HEENT: normal Lymph: no adenopathy Neck:  +  JVD Endocrine:  No thryomegaly Vascular: No carotid bruits; FA pulses 2+ bilaterally without bruits  Cardiac:  normal S1, S2; RRR; no murmur Lungs:  Faint bibasilar rales  Abd: soft, nontender, no hepatomegaly  Ext: no edema Musculoskeletal:  No deformities, BUE and BLE strength normal and equal Skin: warm and dry  Neuro:  CNs 2-12 intact, no focal abnormalities noted Psych:  Normal affect     Relevant CV Studies: LHC: 05/07/17  Conclusion   1. Severe native three-vessel coronary artery disease with severe diffuse heavily calcified stenosis of the LAD, severe ostial stenosis of the left circumflex, and total occlusion of the RCA 2. Status post aortocoronary bypass surgery with continued patency of the LIMA to LAD and saphenous vein graft to PDA and total occlusion of the saphenous vein graft to diagonal and saphenous vein graft OM. 3. Severe segmental contraction abnormality the left ventricle with LVEF less than 35% and typical appearance of acute Takotsubo syndrome 4. Normal LVEDP 5. Successful PCI of the left circumflex back into the left main (protected left main with a patent LIMA-LAD)  Pt on chronic warfarin for PAF, but currently in sinus. Need to consider anticoagulant/antiplatelet options. Might consider a short duration of dual antiplatelet therapy  with aspirin and brilinta then resume anticoagulation with warfarin versus using a combination of warfarin and brilinta. The patient is allergic to clopidogrel and I do not think we should put her on triple therapy because she is a frail 83 year old at high risk of bleeding.    Echo 05/09/17 Study Conclusions  - Left ventricle: The cavity size was normal. Wall thickness was   normal. Mid to apical anterior, anteroseptal and inferoseptal   akinesis, akinesis of the true apex, apical lateral akinesis,   apical inferior akinesis. No LV apical thrombus noted. Systolic   function was severely reduced. The estimated ejection fraction   was in the range of 25% to 30%. Features are consistent with a   pseudonormal left ventricular filling pattern, with concomitant   abnormal relaxation and increased filling pressure (grade 2   diastolic dysfunction). E/medial e&' > 15 suggests LV end   diastolic pressure at least 20 mmHg. - Aortic valve: Sclerosis without stenosis. - Mitral valve: There was trivial regurgitation. - Left atrium: The atrium was severely dilated. - Right ventricle: The cavity size was normal. Systolic function   was normal. - Tricuspid valve: Peak RV-RA gradient (S): 32 mm Hg. - Pulmonary arteries: PA peak pressure: 35 mm Hg (S). - Inferior vena cava: The vessel was normal in size. The   respirophasic diameter changes were in the normal range (= 50%),   consistent with normal central venous pressure.  Impressions:  - Normal LV size with wall motion abnormalities as noted above   (suggestive of LAD-territory infarction), EF 25-30%. Moderate   diastolic dysfunction. Normal RV size and systolic function.   Moderate LAE.  Laboratory Data:  Chemistry Recent Labs Lab 05/09/17 0510 05/11/17 1133  NA 141 140  K 3.8 3.6  CL 111 110  CO2 24 22  GLUCOSE 98 118*  BUN 19 17  CREATININE 0.96 0.93  CALCIUM 8.7* 9.7  GFRNONAA 52* 54*  GFRAA 60* >60  ANIONGAP 6 8    No  results for input(s): PROT, ALBUMIN, AST, ALT, ALKPHOS, BILITOT in the last 168 hours. Hematology Recent Labs Lab 05/09/17 0510 05/11/17 1133  WBC  5.9 6.0  RBC 3.83* 4.24  HGB 11.6* 13.0  HCT 35.6* 38.8  MCV 93.0 91.5  MCH 30.3 30.7  MCHC 32.6 33.5  RDW 15.5 15.5  PLT 231 275   Cardiac Enzymes Recent Labs Lab 05/05/17 2021 05/06/17 0031 05/06/17 0629  TROPONINI 3.79* 3.36* 2.65*    Recent Labs Lab 05/05/17 1452 05/11/17 1208 05/11/17 1501  TROPIPOC 1.47* 0.24* 0.24*    BNP Recent Labs Lab 05/11/17 1422  BNP 2,135.9*    DDimer No results for input(s): DDIMER in the last 168 hours.  Radiology/Studies:  Dg Chest 2 View  Result Date: 05/11/2017 CLINICAL DATA:  Chest pressure and heaviness with burning sensation between shoulder blades associated with shortness of breath and nausea since 0230 hours, head coronary arterial stent placed on 05/07/2017, hypertension, former smoker, COPD, coronary artery disease EXAM: CHEST  2 VIEW COMPARISON:  05/05/2017 FINDINGS: Normal heart size post CABG. Atherosclerotic calcification aorta. Emphysematous and bronchitic changes compatible with COPD. Increased bibasilar markings question mild interstitial infiltrate. No definite pleural effusion or pneumothorax. Bones demineralized. IMPRESSION: Post CABG. COPD changes with question mild bibasilar interstitial infiltrates versus edema. Aortic Atherosclerosis (ICD10-I70.0) and Emphysema (ICD10-J43.9). Electronically Signed   By: Lavonia Dana M.D.   On: 05/11/2017 12:30    Assessment and Plan:   1. Acute on chronic combined CHF - Recent echocardiogram showed LV function of 25-30% with grade 2 diastolic dysfunction.  - Now presented with shortness of breath and chest pressure. BNP 2135. Chest x-ray and exam consistent with volume overload. Her symptoms is likely due to acute CHF exacerbation. - Will give IV Lasix 40 mg x 1. Intolerance to beta blocker. BP now elevated. Will add lisinopril 2.5mg   qd. Might consider reducing amlodipine to allow up-titration of ACE. Seems she will need daily dose of diuretics. Start Lasix po 20mg  qd from tomorrow. Follow renal function closely.   2. CAD status post CABG and recent PCI - Point-of-care troponin 0.24 x 2. Trending down from last admission. EKG without acute ischemic changes.  3. Hypertension - Blood pressure is elevated. As above.   4. PAF - Maintaining sinus rhythm. Continue Coumadin per pharmacy.  5. Hyperlipidemia - Intolerance to statin. Consider lipid clinic referral as outpatient.  Jarrett Soho, PA  05/11/2017 4:01 PM   Patient seen, examined. Available data reviewed. Agree with findings, assessment, and plan as outlined by Robbie Lis, PA-C. The patient is known to me from her hospitalization earlier this week. She was admitted with acute systolic heart failure and non-STEMI. She underwent PCI of the left circumflex which was uncomplicated. She had findings that were suggestive of acute Takotsubo Syndrome. She felt well when she left the hospital but overnight developed fairly severe shortness of breath and orthopnea. She also had chest pressure. On exam, she is alert and oriented in no distress. She is a very pleasant elderly woman. JVP is mildly elevated. Lung fields are clear except for fine rales in the bases. Heart is regular rate and rhythm with a grade 2/6 systolic murmur at the apex. There is no peripheral edema. X-ray and lab data is reviewed and suggests acute heart failure with interstitial edema on chest x-ray and markedly elevated BNP. She is already feeling better after IV furosemide in the emergency room. I think she will probably need another 24 hours of diuresis here in the hospital. She has a lot of medication intolerances, but we'll try her on a low-dose of an ACE inhibitor considering her acute LV dysfunction. She  has not been able to tolerate beta blockers even at very low doses in the past.  Sherren Mocha, M.D. 05/11/2017 6:27 PM

## 2017-05-11 NOTE — ED Notes (Signed)
Pt placed on 2L [place]

## 2017-05-11 NOTE — Telephone Encounter (Signed)
[name], RN has spoken with pt's son . See phone call from 6/28 for documentation.  Pt has been advised to call EMS.

## 2017-05-11 NOTE — ED Notes (Signed)
Attempted report 

## 2017-05-11 NOTE — ED Provider Notes (Signed)
[facility] DEPT Provider Note   CSN: [id number] Arrival date & time: 05/11/17  1124     History   Chief Complaint Chief Complaint  Patient presents with  . Chest Pain    HPI [name] is a 83 y.o. female with h/o severe 3V CAD s/p CABG, NSTEMI on 05/05/17 s/p stent, HTN, HLD, PAF on coumadin, PVD, LVEF 20-25%, COPD presents to ED with central chest pressure ("bricks") associated with shortness of breath and nausea since 2am today.  Patient was woken up by "burning" central back pain that radiated to central chest associated with SOB, she took one nitroglycerin and fell asleep again. She again work up an hour later with even heavier central chest pressure and worsening SOB.  She got out of bed and walked around, since her symptoms have slightly improved. Symptoms worsen when laying flat. Continues to endorse chest wall heaviness and SOB. Symptoms similar to previous heart attack last week.   Patient was admitted for NSTEMI on 05/05/17, recently discharged on 05/08/17.   HPI  Past Medical History:  Diagnosis Date  . Arrhythmia 02/2009   Paroxysmal Fibrillation  . CAD (coronary artery disease)    status post previous anterior wall myocardial infarction, treated with PTCA dx and subsequent non-ST- elevation myocardial infarction with recent bypass surgery, November 29, 2008.  05/07/17 PCI-->DES to pLCx, EF 35%  . Colitis, ischemic (HCC)    Hx of  . Colon neoplasm    Family history of  . COPD (chronic obstructive pulmonary disease) ([place])   . Former smoker   . Herpes zoster ophthalmicus   . HTN (hypertension)   . Hyperlipidemia   . Iron deficiency   . Postherpetic neuralgia   . PVD (peripheral vascular disease) ([place])    with claudication  . Raynaud phenomenon   . SOB (shortness of breath)    uncertain etiology  . Systolic heart failure [facility])     Patient Active Problem List   Diagnosis Date Noted  . NSTEMI (non-ST elevated myocardial infarction) ([place])   . SEMI (subendocardial  myocardial infarction) ([place]) 05/05/2017  . Other dysphagia 11/21/2013  . PAD (peripheral artery disease) ([place]) 03/05/2012  . CAD (coronary artery disease) 03/07/2011  . CAROTID ARTERY DISEASE 12/20/2009  . PVD 04/20/2009  . PVD WITH CLAUDICATION 02/24/2009  . ATRIAL FIBRILLATION 02/23/2009  . HYPERKALEMIA 01/18/2009  . HYPERLIPIDEMIA-MIXED 01/17/2009  . HYPERTENSION, BENIGN 01/17/2009  . CAD, AUTOLOGOUS BYPASS GRAFT 01/17/2009  . ADVERSE DRUG REACTION 01/17/2009  . POSTHERPETIC NEURALGIA 02/14/2008  . HERPES ZOSTER OPHTHALMICUS 02/14/2008  . IRON DEFICIENCY 02/14/2008  . COPD 02/14/2008  . ISCHEMIC COLITIS 02/14/2008    Past Surgical History:  Procedure Laterality Date  . APPENDECTOMY    . CORONARY ARTERY BYPASS GRAFT     11/27/2008  . CORONARY STENT INTERVENTION N/A 05/07/2017   Procedure: Coronary Stent Intervention;  Surgeon: Sherren Mocha, MD;  Location: [facility] CV LAB;  Service: Cardiovascular;  Laterality: N/A;  . LEFT HEART CATH AND CORS/GRAFTS ANGIOGRAPHY N/A 05/07/2017   Procedure: Left Heart Cath and Cors/Grafts Angiography;  Surgeon: Sherren Mocha, MD;  Location: [facility];  Service: Cardiovascular;  Laterality: N/A;    OB History    No data available       Home Medications    Prior to Admission medications   Medication Sig Start Date End Date Taking? Authorizing Provider  ALPRAZolam (XANAX) 0.25 MG tablet Take 0.25 mg by mouth every 8 (eight) hours as needed for anxiety.  [provider]  amLODipine (NORVASC) 5 MG tablet Take 1 tablet (5 mg total) by mouth 2 (two) times daily. 03/07/11   Burnell Blanks, MD  docusate sodium (COLACE) 100 MG capsule Take 100 mg by mouth daily as needed (for constipation).     [provider]  furosemide (LASIX) 20 MG tablet Take 1 tablet (20 mg total) by mouth daily as needed. For weight gain of 3lbs in a day, 5lbs in a week. 05/09/17   Cheryln Manly, NP  nitroGLYCERIN (NITROSTAT)  0.4 MG SL tablet Place 0.4 mg under the tongue as needed for chest pain.     [provider]  ticagrelor (BRILINTA) 90 MG TABS tablet Take 1 tablet (90 mg total) by mouth 2 (two) times daily. 05/09/17   Cheryln Manly, NP  Vitamin D, Cholecalciferol, 1000 units CAPS Take 1,000 Units by mouth daily.    [provider]  warfarin (COUMADIN) 2.5 MG tablet Take 2.5-3.75 mg by mouth See admin instructions. 3.75 mg at bedtime on Mon/Wed/Fri/Sat and 2.5 mg on Sun/Tues/Thurs    [provider]    Family History Family History  Problem Relation Age of Onset  . Colon cancer Father   . Colon cancer Mother     Social History Social History  Substance Use Topics  . Smoking status: Former Research scientist (life sciences)  . Smokeless tobacco: Never Used     Comment: Tobacco abuse for 65 years, one pack per day, stopped in January 2010.   Marland Kitchen Alcohol use Yes     Comment: 2 alcoholic beverages per day.     Allergies   Beta adrenergic blockers; Cardizem [diltiazem hcl]; Clopidogrel bisulfate; Codeine; Statins; Tylenol [acetaminophen]; and Other   Review of Systems Review of Systems  Constitutional: Negative for fever.  HENT: Negative for congestion and sore throat.   Eyes: Negative for visual disturbance.  Respiratory: Positive for cough and shortness of breath.   Cardiovascular: Positive for chest pain. Negative for palpitations and leg swelling.  Gastrointestinal: Positive for nausea. Negative for abdominal pain, constipation, diarrhea and vomiting.  Genitourinary: Negative for difficulty urinating.  Musculoskeletal: Positive for back pain.  Neurological: Negative for light-headedness and headaches.     Physical Exam Updated Vital Signs BP 121/62   Pulse 66   Temp 98.2 F (36.8 C) (Oral)   Resp 17   SpO2 93%   Physical Exam  Constitutional: She is oriented to person, place, and time. She appears well-developed and well-nourished. No distress.  AxO x 3  HENT:  Head:  Normocephalic and atraumatic.  Nose: Nose normal.  Mouth/Throat: Oropharynx is clear and moist. No oropharyngeal exudate.  Eyes: Conjunctivae and EOM are normal. Pupils are equal, round, and reactive to light.  Neck: Normal range of motion. Neck supple. No JVD present.  Cardiovascular: Normal rate, regular rhythm and intact distal pulses.   Murmur heard. No LE edema Calves supple and non tender  Pulmonary/Chest: Effort normal and breath sounds normal. No respiratory distress. She has no wheezes. She has no rales. She exhibits no tenderness.  Abdominal: Soft. Bowel sounds are normal. There is no tenderness.  Musculoskeletal: Normal range of motion. She exhibits no deformity.  Lymphadenopathy:    She has no cervical adenopathy.  Neurological: She is alert and oriented to person, place, and time. No sensory deficit.  Skin: Skin is warm and dry. Capillary refill takes less than 2 seconds.  Psychiatric: She has a normal mood and affect. Her behavior is normal. Judgment and thought  content normal.  Nursing note and vitals reviewed.    ED Treatments / Results  Labs (all labs ordered are listed, but only abnormal results are displayed) Labs Reviewed  BASIC METABOLIC PANEL - Abnormal; Notable for the following:       Result Value   Glucose, Bld 118 (*)    GFR calc non Af Amer 54 (*)    All other components within normal limits  BRAIN NATRIURETIC PEPTIDE - Abnormal; Notable for the following:    B Natriuretic Peptide 2,135.9 (*)    All other components within normal limits  PROTIME-INR - Abnormal; Notable for the following:    Prothrombin Time 20.2 (*)    All other components within normal limits  I-STAT TROPOININ, ED - Abnormal; Notable for the following:    Troponin i, poc 0.24 (*)    All other components within normal limits  I-STAT TROPOININ, ED - Abnormal; Notable for the following:    Troponin i, poc 0.24 (*)    All other components within normal limits  CBC    EKG  EKG  Interpretation  Date/Time:  Friday May 11 2017 11:32:01 EDT Ventricular Rate:  90 PR Interval:  164 QRS Duration: 90 QT Interval:  400 QTC Calculation: 489 R Axis:   81 Text Interpretation:  Sinus rhythm with occasional Premature ventricular complexes Nonspecific T wave abnormality Prolonged QT Abnormal ECG Confirmed by Wilson Singer  MD, Annie Main [phone]) on 05/11/2017 3:13:03 PM       Radiology Dg Chest 2 View  Result Date: 05/11/2017 CLINICAL DATA:  Chest pressure and heaviness with burning sensation between shoulder blades associated with shortness of breath and nausea since 0230 hours, head coronary arterial stent placed on 05/07/2017, hypertension, former smoker, COPD, coronary artery disease EXAM: CHEST  2 VIEW COMPARISON:  05/05/2017 FINDINGS: Normal heart size post CABG. Atherosclerotic calcification aorta. Emphysematous and bronchitic changes compatible with COPD. Increased bibasilar markings question mild interstitial infiltrate. No definite pleural effusion or pneumothorax. Bones demineralized. IMPRESSION: Post CABG. COPD changes with question mild bibasilar interstitial infiltrates versus edema. Aortic Atherosclerosis (ICD10-I70.0) and Emphysema (ICD10-J43.9). Electronically Signed   By: Lavonia Dana M.D.   On: 05/11/2017 12:30    Procedures Procedures (including critical care time)  Medications Ordered in ED Medications  furosemide (LASIX) injection 40 mg (not administered)     Initial Impression / Assessment and Plan / ED Course  I have reviewed the triage vital signs and the nursing notes.  Pertinent labs & imaging results that were available during my care of the patient were reviewed by me and considered in my medical decision making (see chart for details).  Clinical Course as of May 11 1637  Fri May 11, 2017  1403 Troponin i, poc: (!!) 0.24 [CG]  1404 IMPRESSION: Post CABG.  COPD changes with question mild bibasilar interstitial infiltrates versus edema.  Aortic  Atherosclerosis (ICD10-I70.0) and Emphysema (ICD10-J43.9).  [CG]  1536 Troponin i, poc: (!!) 0.24 [CG]    Clinical Course User Index [CG] Kinnie Feil, PA-C   83 yo female with h/o severe 3V CAD s/p CABG, NSTEMI 05/05/17 and stent placed on 05/07/17, HTN, HLD, PAF on coumadin, PVD, LVEF 20-25%, COPD presents to ED with central chest pressure, SOB and nausea since 2 am today.  Nitro mildly alleviated CP.  Currently endorsing mild chest pressure and SOB, worse with HOB flat. Noted new murmur on exam, otherwise cardiopulmonary exam reassuring. No significant signs of fluid overload. She reports very slight cough x 2 days,  but no fever. Weight has remained stable ~110lb.   Lab work remarkable for elevated troponin at 0.24 x 2. BNP 2,134.  EKG NSR, nonspecific T wave abnormality and prolonged QT CBC, BMP, INR wnl. CXR showed bibasilar interstitial infiltrates vs edema, likely edema given orthopnea on exam. Patient reports very mild cough, but no other PNA symptoms. Will consult cardiology.   Final Clinical Impressions(s) / ED Diagnoses   Final diagnoses:  Chest pressure   At shift change, pending cardiology consult for disposition.  Handed off to oncoming ED PA who will f/u on cardiology recommendations.  New Prescriptions New Prescriptions   No medications on file     Arlean Hopping 05/11/17 1638    Virgel Manifold, MD 05/20/17 [phone]

## 2017-05-11 NOTE — ED Triage Notes (Addendum)
Pt presents for evaluation of central chest heaviness with radiation to back, sob, nausea. Pt reports had stent placed Monday by Dr. Burt Knack. Pt AxO x4, able to speak in complete sentences. Pt reports took 1 nitro this AM with some relief but pain came back. Pt states started on brilenta but no ASA.

## 2017-05-11 NOTE — Progress Notes (Signed)
ANTICOAGULATION CONSULT NOTE - Initial Consult  Pharmacy Consult for warfarin Indication: atrial fibrillation  Allergies  Allergen Reactions  . Beta Adrenergic Blockers     "Beta Blockers"- Reaction not recalled by patient  . Cardizem [Diltiazem Hcl]     Reaction not recalled by patient  . Clopidogrel Bisulfate Nausea Only and Other (See Comments)    Also "could smell it"  . Codeine Nausea And Vomiting  . Statins     Reaction not recalled by patient  . Tape Other (See Comments)    THE PATIENT'S SKIN IS THIN AND TEARS EASILY; PLEASE USE COBAN WRAP!!  . Tylenol [Acetaminophen] Nausea And Vomiting  . Other Rash    No dish washing detergent    Patient Measurements: Height: 5\' 2"  (157.5 cm) Weight: 108 lb 6.4 oz (49.2 kg) IBW/kg (Calculated) : 50.1  Vital Signs: Temp: 98 F (36.7 C) (06/29 1810) Temp Source: Oral (06/29 1810) BP: 143/65 (06/29 1810) Pulse Rate: 75 (06/29 1810)  Labs:  Recent Labs  05/09/17 0510 05/11/17 1133 05/11/17 1423  HGB 11.6* 13.0  --   HCT 35.6* 38.8  --   PLT 231 275  --   LABPROT 19.1*  --  20.2*  INR 1.59  --  1.70  CREATININE 0.96 0.93  --     Estimated Creatinine Clearance: 33.1 mL/min (by C-G formula based on SCr of 0.93 mg/dL).   Medical History: Past Medical History:  Diagnosis Date  . Arrhythmia 02/2009   Paroxysmal Fibrillation  . CAD (coronary artery disease)    status post previous anterior wall myocardial infarction, treated with PTCA dx and subsequent non-ST- elevation myocardial infarction with recent bypass surgery, November 29, 2008.  05/07/17 PCI-->DES to pLCx, EF 35%  . Colitis, ischemic (HCC)    Hx of  . Colon neoplasm    Family history of  . COPD (chronic obstructive pulmonary disease) ([place])   . Former smoker   . Herpes zoster ophthalmicus   . HTN (hypertension)   . Hyperlipidemia   . Iron deficiency   . Postherpetic neuralgia   . PVD (peripheral vascular disease) ([place])    with claudication  . Raynaud  phenomenon   . SOB (shortness of breath)    uncertain etiology  . Systolic heart failure (HCC)     Medications:  See electronic med hx   Assessment: 83 y/o female with recent STEMI s/p DES to ostial circumflex back into the left main who presented to ED with CP and SOB and was admitted for volume overload. She takes warfarin PTA for Afib. Pharmacy consulted to manage warfarin inpatient.  INR is subtherapeutic at 1.7. No bleeding noted, CBC is normal.  PTA regimen: 3.75 mg daily except 2.5 mg TTSu, last dose PTA 6/28  Goal of Therapy:  INR 2-3 Monitor platelets by anticoagulation protocol: Yes   Plan:  - Warfarin 6 mg PO tonight - Daily INR - Monitor for s/sx of bleeding   Renold Genta, PharmD, BCPS Clinical Pharmacist Phone for tonight - [place] - [phone] 05/11/2017 6:58 PM

## 2017-05-11 NOTE — Telephone Encounter (Signed)
°  New Prob  Pt c/o of Chest Pain: STAT if CP now or developed within 24 hours  1. Are you having CP right now?  Yes, slight chest pressure  2. Are you experiencing any other symptoms (ex. SOB, nausea, vomiting, sweating)? SOB   3. How long have you been experiencing CP?  2:30 AM  4. Is your CP continuous or coming and going? Constant  5. Have you taken Nitroglycerin? Yes, last night

## 2017-05-12 DIAGNOSIS — E785 Hyperlipidemia, unspecified: Secondary | ICD-10-CM | POA: Diagnosis not present

## 2017-05-12 DIAGNOSIS — J439 Emphysema, unspecified: Secondary | ICD-10-CM | POA: Diagnosis present

## 2017-05-12 DIAGNOSIS — I255 Ischemic cardiomyopathy: Secondary | ICD-10-CM | POA: Diagnosis not present

## 2017-05-12 DIAGNOSIS — I214 Non-ST elevation (NSTEMI) myocardial infarction: Secondary | ICD-10-CM | POA: Diagnosis not present

## 2017-05-12 DIAGNOSIS — I252 Old myocardial infarction: Secondary | ICD-10-CM | POA: Diagnosis not present

## 2017-05-12 DIAGNOSIS — R001 Bradycardia, unspecified: Secondary | ICD-10-CM | POA: Diagnosis present

## 2017-05-12 DIAGNOSIS — I2510 Atherosclerotic heart disease of native coronary artery without angina pectoris: Secondary | ICD-10-CM | POA: Diagnosis not present

## 2017-05-12 DIAGNOSIS — E876 Hypokalemia: Secondary | ICD-10-CM | POA: Diagnosis present

## 2017-05-12 DIAGNOSIS — Z955 Presence of coronary angioplasty implant and graft: Secondary | ICD-10-CM | POA: Diagnosis not present

## 2017-05-12 DIAGNOSIS — I700 Atherosclerosis of aorta: Secondary | ICD-10-CM | POA: Diagnosis present

## 2017-05-12 DIAGNOSIS — Z800 Family history of malignant neoplasm of digestive organs: Secondary | ICD-10-CM | POA: Diagnosis not present

## 2017-05-12 DIAGNOSIS — R0789 Other chest pain: Secondary | ICD-10-CM | POA: Diagnosis not present

## 2017-05-12 DIAGNOSIS — Z87891 Personal history of nicotine dependence: Secondary | ICD-10-CM | POA: Diagnosis not present

## 2017-05-12 DIAGNOSIS — I480 Paroxysmal atrial fibrillation: Secondary | ICD-10-CM | POA: Diagnosis present

## 2017-05-12 DIAGNOSIS — Z7901 Long term (current) use of anticoagulants: Secondary | ICD-10-CM | POA: Diagnosis not present

## 2017-05-12 DIAGNOSIS — I5043 Acute on chronic combined systolic (congestive) and diastolic (congestive) heart failure: Secondary | ICD-10-CM | POA: Diagnosis not present

## 2017-05-12 DIAGNOSIS — I7300 Raynaud's syndrome without gangrene: Secondary | ICD-10-CM | POA: Diagnosis not present

## 2017-05-12 DIAGNOSIS — I110 Hypertensive heart disease with heart failure: Secondary | ICD-10-CM | POA: Diagnosis present

## 2017-05-12 DIAGNOSIS — I25810 Atherosclerosis of coronary artery bypass graft(s) without angina pectoris: Secondary | ICD-10-CM | POA: Diagnosis not present

## 2017-05-12 LAB — BASIC METABOLIC PANEL
Anion gap: 7 (ref 5–15)
BUN: 15 mg/dL (ref 6–20)
CO2: 27 mmol/L (ref 22–32)
CREATININE: 1.04 mg/dL — AB (ref 0.44–1.00)
Calcium: 8.8 mg/dL — ABNORMAL LOW (ref 8.9–10.3)
Chloride: 108 mmol/L (ref 101–111)
GFR calc Af Amer: 54 mL/min — ABNORMAL LOW (ref >60)
GFR, EST NON AFRICAN AMERICAN: 47 mL/min — AB (ref >60)
Glucose, Bld: 99 mg/dL (ref 65–99)
POTASSIUM: 3.9 mmol/L (ref 3.5–5.1)
SODIUM: 142 mmol/L (ref 135–145)

## 2017-05-12 LAB — PROTIME-INR
INR: 1.88
Prothrombin Time: 21.8 seconds — ABNORMAL HIGH (ref 11.4–15.2)

## 2017-05-12 LAB — CBC
HCT: 35.4 % — ABNORMAL LOW (ref 36.0–46.0)
Hemoglobin: 11.5 g/dL — ABNORMAL LOW (ref 12.0–15.0)
MCH: 29.8 pg (ref 26.0–34.0)
MCHC: 32.5 g/dL (ref 30.0–36.0)
MCV: 91.7 fL (ref 78.0–100.0)
PLATELETS: 267 10*3/uL (ref 150–400)
RBC: 3.86 MIL/uL — ABNORMAL LOW (ref 3.87–5.11)
RDW: 15.5 % (ref 11.5–15.5)
WBC: 5.8 10*3/uL (ref 4.0–10.5)

## 2017-05-12 MED ORDER — WARFARIN SODIUM 7.5 MG PO TABS
3.7500 mg | ORAL_TABLET | Freq: Once | ORAL | Status: AC
  Administered 2017-05-12: 3.75 mg via ORAL
  Filled 2017-05-12: qty 0.5

## 2017-05-12 NOTE — Progress Notes (Signed)
ANTICOAGULATION CONSULT NOTE - Initial Consult  Pharmacy Consult for warfarin Indication: atrial fibrillation  Allergies  Allergen Reactions  . Beta Adrenergic Blockers     "Beta Blockers"- Reaction not recalled by patient  . Cardizem [Diltiazem Hcl]     Reaction not recalled by patient  . Clopidogrel Bisulfate Nausea Only and Other (See Comments)    Also "could smell it"  . Codeine Nausea And Vomiting  . Statins     Reaction not recalled by patient  . Tape Other (See Comments)    THE PATIENT'S SKIN IS THIN AND TEARS EASILY; PLEASE USE COBAN WRAP!!  . Tylenol [Acetaminophen] Nausea And Vomiting  . Other Rash    No dish washing detergent    Patient Measurements: Height: 5\' 2"  (157.5 cm) Weight: 107 lb 2.3 oz (48.6 kg) IBW/kg (Calculated) : 50.1  Vital Signs: Temp: 97.9 F (36.6 C) (06/30 0920) Temp Source: Oral (06/30 0920) BP: 113/55 (06/30 0920) Pulse Rate: 71 (06/30 0920)  Labs:  Recent Labs  05/11/17 1133 05/11/17 1423 05/12/17 0315  HGB 13.0  --  11.5*  HCT 38.Jessica  --  35.4*  PLT 275  --  267  LABPROT  --  20.2* 21.Jessica*  INR  --  1.70 1.88  CREATININE 0.93  --  1.04*    Estimated Creatinine Clearance: 29.2 mL/min (A) (by C-G formula based on SCr of 1.04 mg/dL (H)).   Medical History: Past Medical History:  Diagnosis Date  . Acute on chronic diastolic CHF (congestive heart failure) ([place])   . Anginal pain ([place])   . Anxiety    "when I have one of these heart spells" (05/11/2017)  . Arrhythmia 02/2009   Paroxysmal Fibrillation  . CAD (coronary artery disease)    status post previous anterior wall myocardial infarction, treated with PTCA dx and subsequent non-ST- elevation myocardial infarction with recent bypass surgery, November 29, 2008.  05/07/17 PCI-->DES to pLCx, EF 35%  . Colitis, ischemic ([place])    Hx of; pt denies this on 05/11/2017  . Colon neoplasm    Family history of  . COPD (chronic obstructive pulmonary disease) ([place])   . Former smoker   .  Herpes zoster ophthalmicus   . HTN (hypertension)   . Hyperlipidemia   . Pneumonia 1940s-1950s X 3  . Postherpetic neuralgia   . PVD (peripheral vascular disease) ([place])    with claudication  . Raynaud phenomenon   . SOB (shortness of breath)    uncertain etiology  . STEMI (ST elevation myocardial infarction) ([place])    recent/notes 05/11/2017  . Systolic heart failure (HCC)     Medications:  See electronic med hx   Assessment: 83 y/o [name] with recent STEMI s/p DES to ostial circumflex back into the left main who presented to ED with CP and SOB and was admitted for volume overload. She takes warfarin PTA for Afib. Pharmacy consulted to manage warfarin inpatient. INR subtherapeutic on admission at 1.7.   INR remains subtherapeutic but trending up to 1.88 today after increased dose given yesterday. No bleeding noted, Hgb with slight trend down.  PTA regimen: 3.75 mg daily except 2.5 mg TTSu, last dose PTA 6/28  Goal of Therapy:  INR 2-3 Monitor platelets by anticoagulation protocol: Yes   Plan:  - Warfarin 3.75 mg PO tonight - Daily INR - Monitor for s/sx of bleeding   Bernestine Holsapple K. Velva Harman, PharmD, BCPS, CPP Clinical Pharmacist Pager: [phone] Phone: [phone] 05/12/2017 12:30 PM

## 2017-05-12 NOTE — Progress Notes (Signed)
Pt c/o SOB with ambulation to BR. Recovers quickly when at rest.

## 2017-05-12 NOTE — Progress Notes (Signed)
Progress Note  Patient Name: [name] Date of Encounter: 05/12/2017   Primary Cardiologist: Dr. Darlina Guys  Subjective   Feels better in general but still short of breath moving around in her room. No chest pain or palpitations.  Inpatient Medications    Scheduled Meds: . amLODipine  5 mg Oral BID  . cholecalciferol  1,000 Units Oral Daily  . furosemide  40 mg Intravenous Q12H  . lisinopril  2.5 mg Oral Daily  . ticagrelor  90 mg Oral BID  . Warfarin - Pharmacist Dosing Inpatient   Does not apply q1800    PRN Meds: ALPRAZolam, docusate sodium, nitroGLYCERIN, ondansetron (ZOFRAN) IV   Vital Signs    Vitals:   05/12/17 0624 05/12/17 0626 05/12/17 0756 05/12/17 0920  BP: 123/60 (!) 121/59 122/60 (!) 113/55  Pulse: (!) 59 (!) 58 (!) 54 71  Resp:   18 18  Temp:   97.1 F (36.2 C) 97.9 F (36.6 C)  TempSrc:   Oral Oral  SpO2: 98% 97% 98% 96%  Weight:      Height:        Intake/Output Summary (Last 24 hours) at 05/12/17 1156 Last data filed at 05/12/17 1100  Gross per 24 hour  Intake              380 ml  Output             2000 ml  Net            -1620 ml   Filed Weights   05/11/17 1810 05/12/17 0430  Weight: 108 lb 6.4 oz (49.2 kg) 107 lb 2.3 oz (48.6 kg)    Telemetry    Sinus rhythm. Personally reviewed.  ECG    Tracing from 05/12/2017 shows sinus bradycardia. Personally reviewed.  Physical Exam   GEN: Elderly woman. No acute distress.   Neck: No JVD. Cardiac:  Indistinct PMI, RRR, no gallop.  Respiratory: Nonlabored. No crackles. GI: Soft, nontender, bowel sounds present. MS: No edema; No deformity.  Labs    Chemistry Recent Labs Lab 05/09/17 0510 05/11/17 1133 05/12/17 0315  NA 141 140 142  K 3.8 3.6 3.9  CL 111 110 108  CO2 24 22 27   GLUCOSE 98 118* 99  BUN 19 17 15   CREATININE 0.96 0.93 1.04*  CALCIUM 8.7* 9.7 8.8*  GFRNONAA 52* 54* 47*  GFRAA 60* >60 54*  ANIONGAP 6 8 7      Hematology Recent Labs Lab 05/09/17 0510  05/11/17 1133 05/12/17 0315  WBC 5.9 6.0 5.8  RBC 3.83* 4.24 3.86*  HGB 11.6* 13.0 11.5*  HCT 35.6* 38.8 35.4*  MCV 93.0 91.5 91.7  MCH 30.3 30.7 29.8  MCHC 32.6 33.5 32.5  RDW 15.5 15.5 15.5  PLT 231 275 267    Cardiac Enzymes Recent Labs Lab 05/05/17 2021 05/06/17 0031 05/06/17 0629  TROPONINI 3.79* 3.36* 2.65*    Recent Labs Lab 05/05/17 1452 05/11/17 1208 05/11/17 1501  TROPIPOC 1.47* 0.24* 0.24*     BNP Recent Labs Lab 05/11/17 1422  BNP 2,135.9*      Radiology    Dg Chest 2 View  Result Date: 05/11/2017 CLINICAL DATA:  Chest pressure and heaviness with burning sensation between shoulder blades associated with shortness of breath and nausea since 0230 hours, head coronary arterial stent placed on 05/07/2017, hypertension, former smoker, COPD, coronary artery disease EXAM: CHEST  2 VIEW COMPARISON:  05/05/2017 FINDINGS: Normal heart size post CABG. Atherosclerotic calcification aorta. Emphysematous and  bronchitic changes compatible with COPD. Increased bibasilar markings question mild interstitial infiltrate. No definite pleural effusion or pneumothorax. Bones demineralized. IMPRESSION: Post CABG. COPD changes with question mild bibasilar interstitial infiltrates versus edema. Aortic Atherosclerosis (ICD10-I70.0) and Emphysema (ICD10-J43.9). Electronically Signed   By: Lavonia Dana M.D.   On: 05/11/2017 12:30    Cardiac Studies   Echocardiogram 05/09/2017: Study Conclusions  - Left ventricle: The cavity size was normal. Wall thickness was   normal. Mid to apical anterior, anteroseptal and inferoseptal   akinesis, akinesis of the true apex, apical lateral akinesis,   apical inferior akinesis. No LV apical thrombus noted. Systolic   function was severely reduced. The estimated ejection fraction   was in the range of 25% to 30%. Features are consistent with a   pseudonormal left ventricular filling pattern, with concomitant   abnormal relaxation and increased  filling pressure (grade 2   diastolic dysfunction). E/medial e&' > 15 suggests LV end   diastolic pressure at least 20 mmHg. - Aortic valve: Sclerosis without stenosis. - Mitral valve: There was trivial regurgitation. - Left atrium: The atrium was severely dilated. - Right ventricle: The cavity size was normal. Systolic function   was normal. - Tricuspid valve: Peak RV-RA gradient (S): 32 mm Hg. - Pulmonary arteries: PA peak pressure: 35 mm Hg (S). - Inferior vena cava: The vessel was normal in size. The   respirophasic diameter changes were in the normal range (= 50%),   consistent with normal central venous pressure.  Impressions:  - Normal LV size with wall motion abnormalities as noted above   (suggestive of LAD-territory infarction), EF 25-30%. Moderate   diastolic dysfunction. Normal RV size and systolic function.   Moderate LAE.  Cardiac catheterization and PCI 05/07/2017: 1. Severe native three-vessel coronary artery disease with severe diffuse heavily calcified stenosis of the LAD, severe ostial stenosis of the left circumflex, and total occlusion of the RCA 2. Status post aortocoronary bypass surgery with continued patency of the LIMA to LAD and saphenous vein graft to PDA and total occlusion of the saphenous vein graft to diagonal and saphenous vein graft OM. 3. Severe segmental contraction abnormality the left ventricle with LVEF less than 35% and typical appearance of acute Takotsubo syndrome 4. Normal LVEDP 5. Successful PCI of the left circumflex back into the left main (protected left main with a patent LIMA-LAD)  Pt on chronic warfarin for PAF, but currently in sinus. Need to consider anticoagulant/antiplatelet options. Might consider a short duration of dual antiplatelet therapy with aspirin and brilinta then resume anticoagulation with warfarin versus using a combination of warfarin and brilinta. The patient is allergic to clopidogrel and I do not think we should put  her on triple therapy because she is a frail 84 year old at high risk of bleeding.  Patient Profile     83 y.o. female with a history of CAD status post CABG in 2010, hypertension, PAF, hyperlipidemia, multiple medication intolerances, and recent STEMI treated with DES to the ostial circumflex extending into the left main. LVEF reduced at 25-30% with question of Takotsubo cardiomyopathy as well. She is readmitted to the hospital now with acute on chronic combined heart failure.  Assessment & Plan    1. Acute on chronic heart failure. She has diuresed approximately 1600 cc on IV Lasix.  2. Multivessel CAD status post CABG in 2010 with documented graft disease. Has occlusion of the SVG to diagonal and SVG to OM.  3. Recent STEMI status post recent DES to  the circumflex extending back into the left main.  4. Cardiomyopathy, LVEF 25-30% with question of associated Takotsubo syndrome.  Plan to continue IV Lasix today and reassess tomorrow. She will need to be on a low dose oral diuretic at home. Has prior history of beta blocker intolerance. Continuing Norvasc and recent addition of lisinopril. Also maintained on Coumadin per pharmacy and Brilinta with recent intervention.  Signed, Rozann Lesches, MD  05/12/2017, 11:56 AM

## 2017-05-13 LAB — BASIC METABOLIC PANEL
Anion gap: 10 (ref 5–15)
BUN: 28 mg/dL — AB (ref 6–20)
CHLORIDE: 102 mmol/L (ref 101–111)
CO2: 26 mmol/L (ref 22–32)
CREATININE: 1.09 mg/dL — AB (ref 0.44–1.00)
Calcium: 9 mg/dL (ref 8.9–10.3)
GFR calc Af Amer: 51 mL/min — ABNORMAL LOW (ref >60)
GFR calc non Af Amer: 44 mL/min — ABNORMAL LOW (ref >60)
GLUCOSE: 91 mg/dL (ref 65–99)
POTASSIUM: 3.4 mmol/L — AB (ref 3.5–5.1)
Sodium: 138 mmol/L (ref 135–145)

## 2017-05-13 LAB — CBC
HEMATOCRIT: 36.3 % (ref 36.0–46.0)
HEMOGLOBIN: 12 g/dL (ref 12.0–15.0)
MCH: 30.3 pg (ref 26.0–34.0)
MCHC: 33.1 g/dL (ref 30.0–36.0)
MCV: 91.7 fL (ref 78.0–100.0)
Platelets: 279 10*3/uL (ref 150–400)
RBC: 3.96 MIL/uL (ref 3.87–5.11)
RDW: 15.1 % (ref 11.5–15.5)
WBC: 5.7 10*3/uL (ref 4.0–10.5)

## 2017-05-13 LAB — PROTIME-INR
INR: 1.97
Prothrombin Time: 22.7 seconds — ABNORMAL HIGH (ref 11.4–15.2)

## 2017-05-13 MED ORDER — POTASSIUM CHLORIDE CRYS ER 20 MEQ PO TBCR
20.0000 meq | EXTENDED_RELEASE_TABLET | Freq: Once | ORAL | Status: AC
  Administered 2017-05-13: 20 meq via ORAL
  Filled 2017-05-13: qty 1

## 2017-05-13 MED ORDER — FUROSEMIDE 20 MG PO TABS
20.0000 mg | ORAL_TABLET | Freq: Every day | ORAL | Status: DC
  Administered 2017-05-14: 20 mg via ORAL
  Filled 2017-05-13: qty 1

## 2017-05-13 MED ORDER — WARFARIN SODIUM 7.5 MG PO TABS
3.7500 mg | ORAL_TABLET | Freq: Once | ORAL | Status: AC
  Administered 2017-05-13: 3.75 mg via ORAL
  Filled 2017-05-13: qty 0.5

## 2017-05-13 NOTE — Progress Notes (Signed)
ANTICOAGULATION CONSULT NOTE - Initial Consult  Pharmacy Consult for warfarin Indication: atrial fibrillation  Allergies  Allergen Reactions  . Beta Adrenergic Blockers     "Beta Blockers"- Reaction not recalled by patient  . Cardizem [Diltiazem Hcl]     Reaction not recalled by patient  . Clopidogrel Bisulfate Nausea Only and Other (See Comments)    Also "could smell it"  . Codeine Nausea And Vomiting  . Statins     Reaction not recalled by patient  . Tape Other (See Comments)    THE PATIENT'S SKIN IS THIN AND TEARS EASILY; PLEASE USE COBAN WRAP!!  . Tylenol [Acetaminophen] Nausea And Vomiting  . Other Rash    No dish washing detergent    Patient Measurements: Height: 5\' 2"  (157.5 cm) Weight: 104 lb 9.6 oz (47.4 kg) (scale c) IBW/kg (Calculated) : 50.1  Vital Signs: Temp: 97.9 F (36.6 C) (07/01 0806) Temp Source: Oral (07/01 0806) BP: 105/57 (07/01 0806) Pulse Rate: 71 (07/01 0806)  Labs:  Recent Labs  05/11/17 1133 05/11/17 1423 05/12/17 0315 05/13/17 0330  HGB 13.0  --  11.5* 12.0  HCT 38.8  --  35.4* 36.3  PLT 275  --  267 279  LABPROT  --  20.2* 21.8* 22.7*  INR  --  1.70 1.88 1.97  CREATININE 0.93  --  1.04* 1.09*    Estimated Creatinine Clearance: 27.2 mL/min (A) (by C-G formula based on SCr of 1.09 mg/dL (H)).   Medical History: Past Medical History:  Diagnosis Date  . Acute on chronic diastolic CHF (congestive heart failure) ([place])   . Anginal pain ([place])   . Anxiety    "when I have one of these heart spells" (05/11/2017)  . Arrhythmia 02/2009   Paroxysmal Fibrillation  . CAD (coronary artery disease)    status post previous anterior wall myocardial infarction, treated with PTCA dx and subsequent non-ST- elevation myocardial infarction with recent bypass surgery, November 29, 2008.  05/07/17 PCI-->DES to pLCx, EF 35%  . Colitis, ischemic ([place])    Hx of; pt denies this on 05/11/2017  . Colon neoplasm    Family history of  . COPD (chronic  obstructive pulmonary disease) ([place])   . Former smoker   . Herpes zoster ophthalmicus   . HTN (hypertension)   . Hyperlipidemia   . Pneumonia 1940s-1950s X 3  . Postherpetic neuralgia   . PVD (peripheral vascular disease) ([place])    with claudication  . Raynaud phenomenon   . SOB (shortness of breath)    uncertain etiology  . STEMI (ST elevation myocardial infarction) ([place])    recent/notes 05/11/2017  . Systolic heart failure ([facility])     Medications:  See electronic med hx   Assessment: 83 y/o female with recent STEMI s/p DES to ostial circumflex back into the left main who presented to ED with CP and SOB and was admitted for volume overload. She takes warfarin PTA for Afib. Pharmacy consulted to manage warfarin inpatient. INR subtherapeutic on admission at 1.7.   INR remains just below goal but trend up to 1.97 today after increased dose given on 6/29. No bleeding noted, Hgb stable.  PTA regimen: 3.75 mg daily except 2.5 mg TTSu, last dose PTA 6/28  Goal of Therapy:  INR 2-3 Monitor platelets by anticoagulation protocol: Yes   Plan:  - Warfarin 3.75 mg PO again tonight - Daily INR - Monitor for s/sx of bleeding   Shuntel Fishburn K. Velva Harman, PharmD, BCPS, CPP Clinical Pharmacist Pager: [phone] Phone:  [phone] 05/13/2017 10:50 AM

## 2017-05-13 NOTE — Progress Notes (Signed)
Progress Note  Patient Name: [name] Date of Encounter: 05/13/2017   Primary Cardiologist: Dr. Darlina Guys  Subjective   Reports that she does feel better, but is weak today. Had dyspnea on exertion to bathroom late yesterday. No chest pain.  Inpatient Medications    Scheduled Meds: . amLODipine  5 mg Oral BID  . cholecalciferol  1,000 Units Oral Daily  . furosemide  40 mg Intravenous Q12H  . lisinopril  2.5 mg Oral Daily  . ticagrelor  90 mg Oral BID  . Warfarin - Pharmacist Dosing Inpatient   Does not apply q1800    PRN Meds: ALPRAZolam, docusate sodium, nitroGLYCERIN, ondansetron (ZOFRAN) IV   Vital Signs    Vitals:   05/12/17 1200 05/12/17 1929 05/13/17 0417 05/13/17 0806  BP: 110/62 110/66 (!) 110/59 (!) 105/57  Pulse: 80 70 65 71  Resp: 20 18 18 18   Temp: 97.8 F (36.6 C) 98.2 F (36.8 C) 97.6 F (36.4 C) 97.9 F (36.6 C)  TempSrc: Oral Oral Oral Oral  SpO2: 100% 97% 98% 98%  Weight:   104 lb 9.6 oz (47.4 kg)   Height:        Intake/Output Summary (Last 24 hours) at 05/13/17 1004 Last data filed at 05/13/17 0300  Gross per 24 hour  Intake              360 ml  Output             2300 ml  Net            -1940 ml   Filed Weights   05/11/17 1810 05/12/17 0430 05/13/17 0417  Weight: 108 lb 6.4 oz (49.2 kg) 107 lb 2.3 oz (48.6 kg) 104 lb 9.6 oz (47.4 kg)    Telemetry    Sinus rhythm. Personally reviewed.  ECG    Tracing from 05/12/2017 shows sinus bradycardia. Personally reviewed.  Physical Exam   GEN: Elderly woman. No acute distress.   Neck: No JVD. Cardiac:  Indistinct PMI, RRR, no gallop.  Respiratory: Nonlabored. No crackles. GI: Soft, nontender, bowel sounds present. MS: No edema; No deformity.  Labs    Chemistry  Recent Labs Lab 05/11/17 1133 05/12/17 0315 05/13/17 0330  NA 140 142 138  K 3.6 3.9 3.4*  CL 110 108 102  CO2 22 27 26   GLUCOSE 118* 99 91  BUN 17 15 28*  CREATININE 0.93 1.04* 1.09*  CALCIUM 9.7 8.8* 9.0    GFRNONAA 54* 47* 44*  GFRAA >60 54* 51*  ANIONGAP 8 7 10      Hematology  Recent Labs Lab 05/11/17 1133 05/12/17 0315 05/13/17 0330  WBC 6.0 5.8 5.7  RBC 4.24 3.86* 3.96  HGB 13.0 11.5* 12.0  HCT 38.8 35.4* 36.3  MCV 91.5 91.7 91.7  MCH 30.7 29.8 30.3  MCHC 33.5 32.5 33.1  RDW 15.5 15.5 15.1  PLT 275 267 279    Cardiac EnzymesNo results for input(s): TROPONINI in the last 168 hours.   Recent Labs Lab 05/11/17 1208 05/11/17 1501  TROPIPOC 0.24* 0.24*     BNP  Recent Labs Lab 05/11/17 1422  BNP 2,135.9*      Radiology    Dg Chest 2 View  Result Date: 05/11/2017 CLINICAL DATA:  Chest pressure and heaviness with burning sensation between shoulder blades associated with shortness of breath and nausea since 0230 hours, head coronary arterial stent placed on 05/07/2017, hypertension, former smoker, COPD, coronary artery disease EXAM: CHEST  2 VIEW COMPARISON:  05/05/2017 FINDINGS: Normal heart size post CABG. Atherosclerotic calcification aorta. Emphysematous and bronchitic changes compatible with COPD. Increased bibasilar markings question mild interstitial infiltrate. No definite pleural effusion or pneumothorax. Bones demineralized. IMPRESSION: Post CABG. COPD changes with question mild bibasilar interstitial infiltrates versus edema. Aortic Atherosclerosis (ICD10-I70.0) and Emphysema (ICD10-J43.9). Electronically Signed   By: Lavonia Dana M.D.   On: 05/11/2017 12:30    Cardiac Studies   Echocardiogram 05/09/2017: Study Conclusions  - Left ventricle: The cavity size was normal. Wall thickness was   normal. Mid to apical anterior, anteroseptal and inferoseptal   akinesis, akinesis of the true apex, apical lateral akinesis,   apical inferior akinesis. No LV apical thrombus noted. Systolic   function was severely reduced. The estimated ejection fraction   was in the range of 25% to 30%. Features are consistent with a   pseudonormal left ventricular filling pattern,  with concomitant   abnormal relaxation and increased filling pressure (grade 2   diastolic dysfunction). E/medial e&' > 15 suggests LV end   diastolic pressure at least 20 mmHg. - Aortic valve: Sclerosis without stenosis. - Mitral valve: There was trivial regurgitation. - Left atrium: The atrium was severely dilated. - Right ventricle: The cavity size was normal. Systolic function   was normal. - Tricuspid valve: Peak RV-RA gradient (S): 32 mm Hg. - Pulmonary arteries: PA peak pressure: 35 mm Hg (S). - Inferior vena cava: The vessel was normal in size. The   respirophasic diameter changes were in the normal range (= 50%),   consistent with normal central venous pressure.  Impressions:  - Normal LV size with wall motion abnormalities as noted above   (suggestive of LAD-territory infarction), EF 25-30%. Moderate   diastolic dysfunction. Normal RV size and systolic function.   Moderate LAE.  Cardiac catheterization and PCI 05/07/2017: 1. Severe native three-vessel coronary artery disease with severe diffuse heavily calcified stenosis of the LAD, severe ostial stenosis of the left circumflex, and total occlusion of the RCA 2. Status post aortocoronary bypass surgery with continued patency of the LIMA to LAD and saphenous vein graft to PDA and total occlusion of the saphenous vein graft to diagonal and saphenous vein graft OM. 3. Severe segmental contraction abnormality the left ventricle with LVEF less than 35% and typical appearance of acute Takotsubo syndrome 4. Normal LVEDP 5. Successful PCI of the left circumflex back into the left main (protected left main with a patent LIMA-LAD)  Pt on chronic warfarin for PAF, but currently in sinus. Need to consider anticoagulant/antiplatelet options. Might consider a short duration of dual antiplatelet therapy with aspirin and brilinta then resume anticoagulation with warfarin versus using a combination of warfarin and brilinta. The patient is  allergic to clopidogrel and I do not think we should put her on triple therapy because she is a frail 83 year old at high risk of bleeding.  Patient Profile     83 y.o. female with a history of CAD status post CABG in 2010, hypertension, PAF, hyperlipidemia, multiple medication intolerances, and recent STEMI treated with DES to the ostial circumflex extending into the left main. LVEF reduced at 25-30% with question of Takotsubo cardiomyopathy as well. She is readmitted to the hospital now with acute on chronic combined heart failure.  Assessment & Plan    1. Acute on chronic heart failure. She has diuresed Approximately 3 L total on IV Lasix, weight down to 104 pounds.  2. Multivessel CAD status post CABG in 2010 with documented graft disease. Has occlusion  of the SVG to diagonal and SVG to OM.  3. Recent STEMI status post recent DES to the circumflex extending back into the left main.  4. Cardiomyopathy, LVEF 25-30% with question of associated Takotsubo syndrome.  Plan to stop IV Lasix today. Follow-up ECG. Observe today with plan to start oral Lasix tomorrow and potentially discharge if she feels better. Continuing Norvasc and recent addition of lisinopril. Also maintained on Coumadin per pharmacy and Brilinta with recent intervention.  Signed, Rozann Lesches, MD  05/13/2017, 10:04 AM

## 2017-05-14 DIAGNOSIS — I255 Ischemic cardiomyopathy: Secondary | ICD-10-CM

## 2017-05-14 DIAGNOSIS — I2510 Atherosclerotic heart disease of native coronary artery without angina pectoris: Secondary | ICD-10-CM

## 2017-05-14 LAB — BASIC METABOLIC PANEL
ANION GAP: 8 (ref 5–15)
BUN: 26 mg/dL — ABNORMAL HIGH (ref 6–20)
CALCIUM: 8.7 mg/dL — AB (ref 8.9–10.3)
CHLORIDE: 105 mmol/L (ref 101–111)
CO2: 24 mmol/L (ref 22–32)
Creatinine, Ser: 0.95 mg/dL (ref 0.44–1.00)
GFR calc non Af Amer: 52 mL/min — ABNORMAL LOW (ref >60)
GLUCOSE: 93 mg/dL (ref 65–99)
Potassium: 3.5 mmol/L (ref 3.5–5.1)
Sodium: 137 mmol/L (ref 135–145)

## 2017-05-14 LAB — CBC
HCT: 35.3 % — ABNORMAL LOW (ref 36.0–46.0)
HEMOGLOBIN: 11.6 g/dL — AB (ref 12.0–15.0)
MCH: 29.9 pg (ref 26.0–34.0)
MCHC: 32.9 g/dL (ref 30.0–36.0)
MCV: 91 fL (ref 78.0–100.0)
Platelets: 289 10*3/uL (ref 150–400)
RBC: 3.88 MIL/uL (ref 3.87–5.11)
RDW: 15.1 % (ref 11.5–15.5)
WBC: 4.8 10*3/uL (ref 4.0–10.5)

## 2017-05-14 LAB — PROTIME-INR
INR: 1.88
Prothrombin Time: 21.9 seconds — ABNORMAL HIGH (ref 11.4–15.2)

## 2017-05-14 MED ORDER — FUROSEMIDE 20 MG PO TABS: 20.0000 mg | ORAL_TABLET | Freq: Every day | ORAL | 0 refills | Status: DC

## 2017-05-14 MED ORDER — FUROSEMIDE 20 MG PO TABS: 20.0000 mg | ORAL_TABLET | Freq: Every day | ORAL | 5 refills | Status: DC

## 2017-05-14 MED ORDER — POTASSIUM CHLORIDE CRYS ER 10 MEQ PO TBCR: 10.0000 meq | EXTENDED_RELEASE_TABLET | Freq: Every day | ORAL | Status: DC

## 2017-05-14 MED ORDER — LISINOPRIL 2.5 MG PO TABS: 2.5000 mg | ORAL_TABLET | Freq: Every day | ORAL | 5 refills | Status: DC

## 2017-05-14 MED ORDER — WARFARIN SODIUM 5 MG PO TABS: 5.0000 mg | ORAL_TABLET | Freq: Once | ORAL | Status: DC

## 2017-05-14 MED ORDER — POTASSIUM CHLORIDE CRYS ER 10 MEQ PO TBCR: 10.0000 meq | EXTENDED_RELEASE_TABLET | Freq: Every day | ORAL | 5 refills | Status: DC

## 2017-05-14 NOTE — Discharge Summary (Signed)
Discharge Summary    Patient ID: [name],  MRN: [id number], DOB/AGE: May 04, 1928 83 y.o.  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 Beta Adrenergic Blockers     "Beta Blockers"- Reaction not recalled by patient  . Cardizem [Diltiazem Hcl]     Reaction not recalled by patient  . Clopidogrel Bisulfate Nausea Only and Other (See Comments)    Also "could smell it"  . Codeine Nausea And Vomiting  . Statins     Reaction not recalled by patient  . Tape Other (See Comments)    THE PATIENT'S SKIN IS THIN AND TEARS EASILY; PLEASE USE COBAN WRAP!!  . Tylenol [Acetaminophen] Nausea And Vomiting  . Other Rash    No dish washing detergent    Diagnostic Studies/Procedures    Studies from recent admission reviewed. _____________   History of Present Illness     [name] is a 83 y.o. female with a history of CABG by Dr Cyndia Bent in 2010 ( LIMA to LAD SVG to D1 SVG OM2 SVG PDA),  HTN, HL PAF on coumadin, PVD, intolerance to statin & BB and recent admission for STEMI presented with CP and SOB. Admitted 6/23-6/27 with STEMI with peak toponin 3.79. A cardiac cath was done on 05/07/17 that showed occluded vein graft to the OM and diagonal, patent grafts to the LAD and RCA, severe native vessel disease and drug-eluting stent is placed to the ostial circumflex back into the left main. LVEF was less than 35% with appearance of acute Takotsubo syndrome. The patient was discharged on Brilinta with plans to keep her on Brilinta and warfarin for at least 3 months. The patient has been unable to tolerate ACE/ARB due to soft blood pressures. At home she developed severe shortness of breath and substernal chest pressure unrelieved by  nitroglycerin. Her shortness of breath is exacerbated by laying down and the patient had not taken any as needed Lasix.  Recent echocardiogram on 05/09/17  showed LV function of 25-3% with grade 2 diastolic dysfunction.  Hospital Course     Consultants: None   BNP on admission was elevated at 2,135. Troponins were mildly elevated and flat at 0.24. Her creatinine and electrolytes were normal. Chest x-ray showed mild bibasilar interstitial infiltrates versus edema. EKG showed nonspecific T wave abnormality, similar to prior EKG post-PCI.   The patient was treated for acute on chronic combined systolic and diastolic heart failure. She was diuresed well and is clinically improved. She is -3.8 L since admission. Will continue 20 mg of Lasix daily. She will need potassium supplementation and K-Dur 10 milliequivalents daily is ordered. She was started on low dose lisinopril 2.5 mg daily.  For CAD with recent and STEMI 05/05/17 with patent grafts to the LAD and PDA with placement of drug-eluting stent to the left main/ostial circumflex the patient is currently stable on Brilinta and lisinopril.  No beta blocker due to bradycardia. She has refused to take statins due to intolerance. No aspirin given need for Coumadin.  Ischemic cardiomyopathy is possible Takotsubo pattern following recent NSTEMI. We'll continue ACE inhibitor. No beta blocker due to bradycardia.  Patient has a history of PAF and is maintaining sinus rhythm and anticoagulated on coumadin.   Patient has been seen by Dr.  McAlhany today and deemed ready for discharge home. All follow up appointments have been scheduled. Discharge medications are listed below.  _____________  Discharge Vitals Blood pressure 129/63, pulse 68, temperature 98 F (36.7 C), temperature source Oral, resp. rate 16, height 5\' 2"  (1.575 m), weight 105 lb 8 oz (47.9 kg), SpO2 99 %.  Filed Weights   05/12/17 0430 05/13/17 0417 05/14/17 0638  Weight: 107 lb 2.3 oz  (48.6 kg) 104 lb 9.6 oz (47.4 kg) 105 lb 8 oz (47.9 kg)    Labs & Radiologic Studies    CBC  Recent Labs  05/13/17 0330 05/14/17 0422  WBC 5.7 4.8  HGB 12.0 11.6*  HCT 36.3 35.3*  MCV 91.7 91.0  PLT 279 195   Basic Metabolic Panel  Recent Labs  05/13/17 0330 05/14/17 0422  NA 138 137  K 3.4* 3.5  CL 102 105  CO2 26 24  GLUCOSE 91 93  BUN 28* 26*  CREATININE 1.09* 0.95  CALCIUM 9.0 8.7*   Liver Function Tests No results for input(s): AST, ALT, ALKPHOS, BILITOT, PROT, ALBUMIN in the last 72 hours. No results for input(s): LIPASE, AMYLASE in the last 72 hours. Cardiac Enzymes No results for input(s): CKTOTAL, CKMB, CKMBINDEX, TROPONINI in the last 72 hours. BNP Invalid input(s): POCBNP D-Dimer No results for input(s): DDIMER in the last 72 hours. Hemoglobin A1C No results for input(s): HGBA1C in the last 72 hours. Fasting Lipid Panel No results for input(s): CHOL, HDL, LDLCALC, TRIG, CHOLHDL, LDLDIRECT in the last 72 hours. Thyroid Function Tests No results for input(s): TSH, T4TOTAL, T3FREE, THYROIDAB in the last 72 hours.  Invalid input(s): FREET3 _____________  Dg Chest 2 View  Result Date: 05/11/2017 CLINICAL DATA:  Chest pressure and heaviness with burning sensation between shoulder blades associated with shortness of breath and nausea since 0230 hours, head coronary arterial stent placed on 05/07/2017, hypertension, former smoker, COPD, coronary artery disease EXAM: CHEST  2 VIEW COMPARISON:  05/05/2017 FINDINGS: Normal heart size post CABG. Atherosclerotic calcification aorta. Emphysematous and bronchitic changes compatible with COPD. Increased bibasilar markings question mild interstitial infiltrate. No definite pleural effusion or pneumothorax. Bones demineralized. IMPRESSION: Post CABG. COPD changes with question mild bibasilar interstitial infiltrates versus edema. Aortic Atherosclerosis (ICD10-I70.0) and Emphysema (ICD10-J43.9). Electronically Signed   By:  Lavonia Dana M.D.   On: 05/11/2017 12:30   Dg Chest 2 View  Result Date: 05/05/2017 CLINICAL DATA:  Chest pain. Shortness of breath. Pain between the shoulder blades. EXAM: CHEST  2 VIEW COMPARISON:  07/31/2011 FINDINGS: Heart size and pulmonary vascularity are normal. There is calcification in the thoracic aorta. There is accentuation of the interstitial markings at the bases, left more than right. This is new since the prior study of 2012. I suspect it represents chronic interstitial disease. The lungs are somewhat hyperinflated with flattening of the diaphragm. CABG. 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 No acute bone abnormality. IMPRESSION: 1. Probable chronic interstitial disease at the lung bases, left greater than right, new since 2012. 2. Aortic atherosclerosis. Electronically Signed   By: Lorriane Shire M.D.   On: 05/05/2017 15:50   Disposition   Pt is being discharged home today in good condition.  Follow-up Plans & Appointments    Follow-up Information    Charlie Pitter, PA-C Follow up.   Specialties:  Cardiology, Radiology Why:  05/24/17 at 11:00 for cardiology hospital follow up.  Contact information: [address] [place] [address] [place] [address] [phone]  Discharge Instructions    (HEART FAILURE PATIENTS) Call MD:  Anytime you have any of the following symptoms: 1) 3 pound weight gain in 24 hours or 5 pounds in 1 week 2) shortness of breath, with or without a dry hacking cough 3) swelling in the hands, feet or stomach 4) if you have to sleep on extra pillows at night in order to breathe.    Complete by:  As directed    Diet - low sodium heart healthy    Complete by:  As directed    Discharge instructions    Complete by:  As directed    Follow up with coumadin clinic.   Increase activity slowly    Complete by:  As directed       Discharge Medications   Current Discharge Medication List    START taking these medications    Details  lisinopril (PRINIVIL,ZESTRIL) 2.5 MG tablet Take 1 tablet (2.5 mg total) by mouth daily. Qty: 30 tablet, Refills: 5    potassium chloride (K-DUR,KLOR-CON) 10 MEQ tablet Take 1 tablet (10 mEq total) by mouth daily. Qty: 30 tablet, Refills: 5      CONTINUE these medications which have CHANGED   Details  furosemide (LASIX) 20 MG tablet Take 1 tablet (20 mg total) by mouth daily. Qty: 30 tablet, Refills: 5      CONTINUE these medications which have NOT CHANGED   Details  ALPRAZolam (XANAX) 0.25 MG tablet Take 0.25 mg by mouth every 8 (eight) hours as needed for anxiety.     amLODipine (NORVASC) 5 MG tablet Take 1 tablet (5 mg total) by mouth 2 (two) times daily. Qty: 30 tablet, Refills: 11   Associated Diagnoses: HTN (hypertension)    docusate sodium (COLACE) 100 MG capsule Take 100 mg by mouth daily as needed (for constipation).     nitroGLYCERIN (NITROSTAT) 0.4 MG SL tablet Place 0.4 mg under the tongue every 5 (five) minutes as needed for chest pain.     ticagrelor (BRILINTA) 90 MG TABS tablet Take 1 tablet (90 mg total) by mouth 2 (two) times daily. Qty: 60 tablet, Refills: 3    Vitamin D, Cholecalciferol, 1000 units CAPS Take 1,000 Units by mouth daily.    warfarin (COUMADIN) 2.5 MG tablet Take 2.5-3.75 mg by mouth See admin instructions. 3.75 mg at bedtime on Mon/Wed/Fri/Sat and 2.5 mg on Sun/Tues/Thurs          Outstanding Labs/Studies    Duration of Discharge Encounter   Greater than 30 minutes including physician time.  Signed, Daune Perch NP 05/14/2017, 10:36 AM

## 2017-05-14 NOTE — Progress Notes (Signed)
ANTICOAGULATION CONSULT NOTE - Initial Consult  Pharmacy Consult for warfarin Indication: atrial fibrillation  Allergies  Allergen Reactions  . Beta Adrenergic Blockers     "Beta Blockers"- Reaction not recalled by patient  . Cardizem [Diltiazem Hcl]     Reaction not recalled by patient  . Clopidogrel Bisulfate Nausea Only and Other (See Comments)    Also "could smell it"  . Codeine Nausea And Vomiting  . Statins     Reaction not recalled by patient  . Tape Other (See Comments)    THE PATIENT'S SKIN IS THIN AND TEARS EASILY; PLEASE USE COBAN WRAP!!  . Tylenol [Acetaminophen] Nausea And Vomiting  . Other Rash    No dish washing detergent    Patient Measurements: Height: 5\' 2"  (157.5 cm) Weight: 105 lb 8 oz (47.9 kg) (c scale) IBW/kg (Calculated) : 50.1  Vital Signs: Temp: 98 F (36.7 C) (07/02 0627) Temp Source: Oral (07/02 0627) BP: 129/63 (07/02 0627) Pulse Rate: 68 (07/02 0627)  Labs:  Recent Labs  05/12/17 0315 05/13/17 0330 05/14/17 0422  HGB 11.5* 12.0 11.6*  HCT 35.4* 36.3 35.3*  PLT 267 279 289  LABPROT 21.8* 22.7* 21.9*  INR 1.88 1.97 1.88  CREATININE 1.04* 1.09* 0.95    Estimated Creatinine Clearance: 31.5 mL/min (by C-G formula based on SCr of 0.95 mg/dL).   Medical History: Past Medical History:  Diagnosis Date  . Acute on chronic diastolic CHF (congestive heart failure) ([place])   . Anginal pain ([place])   . Anxiety    "when I have one of these heart spells" (05/11/2017)  . Arrhythmia 02/2009   Paroxysmal Fibrillation  . CAD (coronary artery disease)    status post previous anterior wall myocardial infarction, treated with PTCA dx and subsequent non-ST- elevation myocardial infarction with recent bypass surgery, November 29, 2008.  05/07/17 PCI-->DES to pLCx, EF 35%  . Colitis, ischemic ([place])    Hx of; pt denies this on 05/11/2017  . Colon neoplasm    Family history of  . COPD (chronic obstructive pulmonary disease) ([place])   . Former smoker   .  Herpes zoster ophthalmicus   . HTN (hypertension)   . Hyperlipidemia   . Pneumonia 1940s-1950s X 3  . Postherpetic neuralgia   . PVD (peripheral vascular disease) ([place])    with claudication  . Raynaud phenomenon   . SOB (shortness of breath)    uncertain etiology  . STEMI (ST elevation myocardial infarction) ([place])    recent/notes 05/11/2017  . Systolic heart failure (HCC)    Medications:  See electronic med hx   Assessment: 83 y/o female with recent STEMI s/p DES to ostial circumflex back into the left main who presented to ED with CP and SOB and was admitted for volume overload. She takes warfarin PTA for Afib. Pharmacy consulted to manage warfarin inpatient. INR subtherapeutic on admission at 1.7.   INR subtherapeutic (1.88) after slight drop from previous day. CBC stable, no bleed documented.  PTA regimen: 3.75 mg daily except 2.5 mg TTSu, last dose PTA 6/28  Goal of Therapy:  INR 2-3 Monitor platelets by anticoagulation protocol: Yes   Plan:  -Warfarin 5mg  PO x1 tonight - Daily INR - Monitor for s/sx of bleeding  Georga Bora, PharmD Clinical Pharmacist 05/14/2017 9:59 AM

## 2017-05-14 NOTE — Progress Notes (Signed)
Progress Note  Patient Name: [name] Date of Encounter: 05/14/2017   Primary Cardiologist: Dr. Darlina Guys  Subjective   No chest pain. Dyspnea improved.   Inpatient Medications    Scheduled Meds: . amLODipine  5 mg Oral BID  . cholecalciferol  1,000 Units Oral Daily  . furosemide  20 mg Oral Daily  . lisinopril  2.5 mg Oral Daily  . ticagrelor  90 mg Oral BID  . Warfarin - Pharmacist Dosing Inpatient   Does not apply q1800    PRN Meds: ALPRAZolam, docusate sodium, nitroGLYCERIN, ondansetron (ZOFRAN) IV   Vital Signs    Vitals:   05/13/17 1500 05/13/17 2027 05/14/17 0627 05/14/17 0638  BP: (!) 103/50 (!) 111/58 129/63   Pulse: 67 (!) 57 68   Resp: 20 18 16    Temp: 97.7 F (36.5 C) 98.6 F (37 C) 98 F (36.7 C)   TempSrc: Oral Oral Oral   SpO2: 100% 97% 99%   Weight:    105 lb 8 oz (47.9 kg)  Height:        Intake/Output Summary (Last 24 hours) at 05/14/17 0916 Last data filed at 05/14/17 8144  Gross per 24 hour  Intake              480 ml  Output             1075 ml  Net             -595 ml   Filed Weights   05/12/17 0430 05/13/17 0417 05/14/17 8185  Weight: 107 lb 2.3 oz (48.6 kg) 104 lb 9.6 oz (47.4 kg) 105 lb 8 oz (47.9 kg)    Telemetry    Sinus-personally reviewed   ECG    EKG from 05/11/17 with sinus, PVC.   Physical Exam   General: Well developed, thin, NAD  HEENT: OP clear, mucus membranes moist  SKIN: warm, dry. No rashes. Neuro: No focal deficits  Musculoskeletal: Muscle strength 5/5 all ext  Psychiatric: Mood and affect normal  Neck: No JVD, no carotid bruits, no thyromegaly, no lymphadenopathy.  Lungs:Clear bilaterally, no wheezes, rhonci, crackles Cardiovascular: Regular rate and rhythm. No murmurs, gallops or rubs. Abdomen:Soft. Bowel sounds present. Non-tender.  Extremities: No lower extremity edema.     Labs    Chemistry  Recent Labs Lab 05/12/17 0315 05/13/17 0330 05/14/17 0422  NA 142 138 137  K 3.9 3.4*  3.5  CL 108 102 105  CO2 27 26 24   GLUCOSE 99 91 93  BUN 15 28* 26*  CREATININE 1.04* 1.09* 0.95  CALCIUM 8.8* 9.0 8.7*  GFRNONAA 47* 44* 52*  GFRAA 54* 51* >60  ANIONGAP 7 10 8      Hematology  Recent Labs Lab 05/12/17 0315 05/13/17 0330 05/14/17 0422  WBC 5.8 5.7 4.8  RBC 3.86* 3.96 3.88  HGB 11.5* 12.0 11.6*  HCT 35.4* 36.3 35.3*  MCV 91.7 91.7 91.0  MCH 29.8 30.3 29.9  MCHC 32.5 33.1 32.9  RDW 15.5 15.1 15.1  PLT 267 279 289    Recent Labs Lab 05/11/17 1422  BNP 2,135.9*      Radiology    No results found.  Cardiac Studies   Echocardiogram 05/09/2017: Study Conclusions  - Left ventricle: The cavity size was normal. Wall thickness was   normal. Mid to apical anterior, anteroseptal and inferoseptal   akinesis, akinesis of the true apex, apical lateral akinesis,   apical inferior akinesis. No LV apical thrombus noted. Systolic  function was severely reduced. The estimated ejection fraction   was in the range of 25% to 30%. Features are consistent with a   pseudonormal left ventricular filling pattern, with concomitant   abnormal relaxation and increased filling pressure (grade 2   diastolic dysfunction). E/medial e&' > 15 suggests LV end   diastolic pressure at least 20 mmHg. - Aortic valve: Sclerosis without stenosis. - Mitral valve: There was trivial regurgitation. - Left atrium: The atrium was severely dilated. - Right ventricle: The cavity size was normal. Systolic function   was normal. - Tricuspid valve: Peak RV-RA gradient (S): 32 mm Hg. - Pulmonary arteries: PA peak pressure: 35 mm Hg (S). - Inferior vena cava: The vessel was normal in size. The   respirophasic diameter changes were in the normal range (= 50%),   consistent with normal central venous pressure.  Impressions:  - Normal LV size with wall motion abnormalities as noted above   (suggestive of LAD-territory infarction), EF 25-30%. Moderate   diastolic dysfunction. Normal RV  size and systolic function.   Moderate LAE.  Cardiac catheterization and PCI 05/07/2017: 1. Severe native three-vessel coronary artery disease with severe diffuse heavily calcified stenosis of the LAD, severe ostial stenosis of the left circumflex, and total occlusion of the RCA 2. Status post aortocoronary bypass surgery with continued patency of the LIMA to LAD and saphenous vein graft to PDA and total occlusion of the saphenous vein graft to diagonal and saphenous vein graft OM. 3. Severe segmental contraction abnormality the left ventricle with LVEF less than 35% and typical appearance of acute Takotsubo syndrome 4. Normal LVEDP 5. Successful PCI of the left circumflex back into the left main (protected left main with a patent LIMA-LAD)  Pt on chronic warfarin for PAF, but currently in sinus. Need to consider anticoagulant/antiplatelet options. Might consider a short duration of dual antiplatelet therapy with aspirin and brilinta then resume anticoagulation with warfarin versus using a combination of warfarin and brilinta. The patient is allergic to clopidogrel and I do not think we should put her on triple therapy because she is a frail 83 year old at high risk of bleeding.  Patient Profile     83 y.o. female with a history of CAD status post CABG in 2010, hypertension, PAF, hyperlipidemia, multiple medication intolerances, and recent STEMI treated with DES to the ostial circumflex extending into the left main. LVEF reduced at 25-30% with question of Takotsubo cardiomyopathy as well. She was readmitted to [facility] with acute on chronic CHF.   Assessment & Plan    1. Acute on chronic combined systolic and diastolic heart failure: She has diuresed well and is clinically improved. She is negative 3.8 liters since admission. Will continue Lasix 20 mg daily. She will need potassium supplementation at discharge. (Kdur 10 meq per day).   2. CAD without angina: Recent NSTEMI 05/05/17 with patent grafts  to the LAD and PDA with placement of a DES in the left main/ostial circumflex. She is stable on Brilinta and Lisinopril. No beta blocker with bradycardia. She has refused to take statins due to intolerance. No ASA given need for coumadin.   3. Ischemic Cardiomyopathy: Possible Takotsubo pattern following recent NSTEMI. Will continue Ace-inh. No beta blocker due to bradycardia.   4. Hypokalemia: Will replace potassium this am. Will start Kdur 10 meq per day at home  Discharge home today. Follow up office one week.   Signed, Lauree Chandler, MD  05/14/2017, 9:16 AM

## 2017-05-17 ENCOUNTER — Telehealth: Payer: Self-pay | Admitting: Cardiovascular Disease

## 2017-05-17 NOTE — Telephone Encounter (Signed)
Spoke with pt and obtained permission to speak with son.  She agreed.  Son states pt went back to ER after NSTEMI last week.  Was placed on Lasix and Lisinopril at d/c.  Son states everytime pt has taken this medication she becomes extremely fatigued and lethargic and gets very SOB after walking 2-42ft.  Denies wt gain since d/c.  BP today 137/62.  Gets dizzy when she stands but this resolves quickly.  Pt did not take either pill today and has felt fine. Son would like to know if Lasix can be taken PRN instead of daily. Advised I would send message to Dr. Angelena Form for review and advisement.  Son appreciative for call.

## 2017-05-17 NOTE — Telephone Encounter (Signed)
Mr.Devore (Son) is calling because Mrs. Neyens is refusing to take some medication and wants to know if that will be an issue . Please Call

## 2017-05-18 NOTE — Telephone Encounter (Signed)
Left message for pt son to call  

## 2017-05-18 NOTE — Telephone Encounter (Signed)
Spoke with pt son, aware of dr mcalhany's recommendations. He reports the patient does not like the lisinopril because it makes her have drematic symptoms and he is not sure she will stay on the medication. He also thinks the lisinopril is being taken twice daily currently. Advised for the patient to take the lisinopril just once daily at bedtime to see if that will help with her symptoms.

## 2017-05-18 NOTE — Telephone Encounter (Signed)
Can we ask her to weigh daily and if weight increases 2 lbs over her baseline weight, she should take lasix, otherwise, take prn. She should keep taking her Lisinopril. Thanks, chris

## 2017-05-22 DIAGNOSIS — Z7901 Long term (current) use of anticoagulants: Secondary | ICD-10-CM | POA: Diagnosis not present

## 2017-05-22 DIAGNOSIS — I480 Paroxysmal atrial fibrillation: Secondary | ICD-10-CM | POA: Diagnosis not present

## 2017-05-23 NOTE — Progress Notes (Signed)
Cardiology Office Note:    Date:  05/24/2017   ID:  [name], DOB December 12, 1927, MRN [id number]  PCP:  Marton Redwood, MD  Cardiologist:  Dr. Angelena Form  Referring MD: Marton Redwood, MD   Chief Complaint  Patient presents with  . Hospitalization Follow-up    CHF    History of Present Illness:    [name] is a 83 y.o. female with a past medical history significant for History of CABG by Dr. Cyndia Bent in 2010 (LIMA to LAD SVG to D1 SVG OM2 SVG PDA), hypertension, hyperlipidemia, PAF on Coumadin, PVD, mild to moderate bilateral carotid artery disease, intolerance to statin and beta blocker. The patient was admitted 6/23-6/27/18 with NSTEMI with peak troponin of 3.79. A cardiac catheterization was done on 05/07/17 that showed occluded vein graft to the OM and diagonal, patent grafts to the LAD and RCA, severe native vessel disease and a drug-eluting stent was placed to the ostial circumflex back and to the left main. LVEF was less than 35% with the appearance of acute Takotsubo syndrome.  The patient was discharged on Brilinta with plans to keep her on Brilinta and warfarin for at least 3 months. The patient has been unable to tolerate ACE-I/ARB due to soft blood pressures. She was rehospitalized 05/11/17-05/14/2017 for acute on chronic combined systolic and diastolic heart failure. Her BNP was greater than 2000. Troponins were mildly elevated and flat at 0.24. Her creatinine and electrolytes were normal. Chest x-ray showed mild bibasilar interstitial infiltrates versus edema.  The patient was diuresed well with -3.8 L out and she improved clinically. She was discharged on Lasix 20 mg daily and K-Dur 10 milliequivalents daily. Low-dose lisinopril 2.5 mg daily was added. She is not beta blocker due to bradycardia. She has refused to take statins due to intolerance. No aspirin given her need for Coumadin. She continued to maintain sinus rhythm and is anticoagulated with Coumadin.  She was discharged on Lasix 20  mg daily and potassium 10 mEq once daily however the patient states that she has not needed it so she has not been taking it. Her discharge weight was 106 and today's home weight was 106. She weighs daily with no more than a 1 pound difference in her weights. The patient states that her presenting symptoms were fluid retention in her left breast and dyspnea. She has no further dyspnea, orthopnea, or edema. She has stopped taking her lisinopril due to nausea, fast heartbeat, and profound weakness approximately one hour after taking her first dose. She will consider retrial of the lisinopril after she completes her 3 month course of Brilinta. She feels like she has mild increase in breathing effort due to the Brilinta, but not significant like when she was fluid overloaded. She is looking forward to stopping the Brilinta 3 months. She has had no unusual bleeding.   Past Medical History:  Diagnosis Date  . Acute on chronic diastolic CHF (congestive heart failure) ([place])   . Anginal pain ([place])   . Anxiety    "when I have one of these heart spells" (05/11/2017)  . Arrhythmia 02/2009   Paroxysmal Fibrillation  . CAD (coronary artery disease)    status post previous anterior wall myocardial infarction, treated with PTCA dx and subsequent non-ST- elevation myocardial infarction with recent bypass surgery, November 29, 2008.  05/07/17 PCI-->DES to pLCx, EF 35%  . Colitis, ischemic ([place])    Hx of; pt denies this on 05/11/2017  . Colon neoplasm    Family history of  .  COPD (chronic obstructive pulmonary disease) ([place])   . Former smoker   . Herpes zoster ophthalmicus   . HTN (hypertension)   . Hyperlipidemia   . Pneumonia 1940s-1950s X 3  . Postherpetic neuralgia   . PVD (peripheral vascular disease) ([place])    with claudication  . Raynaud phenomenon   . SOB (shortness of breath)    uncertain etiology  . STEMI (ST elevation myocardial infarction) ([place])    recent/notes 05/11/2017  . Systolic heart failure  [facility])     Past Surgical History:  Procedure Laterality Date  . APPENDECTOMY  1946  . CORONARY ANGIOPLASTY WITH STENT PLACEMENT    . CORONARY ARTERY BYPASS GRAFT  11/27/2008   CABG "X4"  . CORONARY STENT INTERVENTION N/A 05/07/2017   Procedure: Coronary Stent Intervention;  Surgeon: Sherren Mocha, MD;  Location: [place] [facility];  Service: Cardiovascular;  Laterality: N/A;  . DILATION AND CURETTAGE OF UTERUS     S/P miscarriage  . LEFT HEART CATH AND CORS/GRAFTS ANGIOGRAPHY N/A 05/07/2017   Procedure: Left Heart Cath and Cors/Grafts Angiography;  Surgeon: Sherren Mocha, MD;  Location: [place] [facility];  Service: Cardiovascular;  Laterality: N/A;  . TONSILLECTOMY  1934    Current Medications: Current Meds  Medication Sig  . ALPRAZolam (XANAX) 0.25 MG tablet Take 0.25 mg by mouth every 8 (eight) hours as needed for anxiety.   Marland Kitchen amLODipine (NORVASC) 5 MG tablet Take 1 tablet (5 mg total) by mouth 2 (two) times daily.  Marland Kitchen docusate sodium (COLACE) 100 MG capsule Take 100 mg by mouth daily as needed (for constipation).   . furosemide (LASIX) 20 MG tablet Take 20 mg by mouth daily as needed for edema.  . nitroGLYCERIN (NITROSTAT) 0.4 MG SL tablet Place 0.4 mg under the tongue every 5 (five) minutes as needed for chest pain.   . potassium chloride (K-DUR,KLOR-CON) 10 MEQ tablet Take 10 mEq by mouth daily as needed (with lasix).   . ticagrelor (BRILINTA) 90 MG TABS tablet Take 1 tablet (90 mg total) by mouth 2 (two) times daily.  . Vitamin D, Cholecalciferol, 1000 units CAPS Take 1,000 Units by mouth daily.  Marland Kitchen warfarin (COUMADIN) 2.5 MG tablet Take 2.5-3.75 mg by mouth See admin instructions. 3.75 mg at bedtime on Mon/Wed/Fri/Sat and 2.5 mg on Sun/Tues/Thurs     Allergies:   Beta adrenergic blockers; Cardizem [diltiazem hcl]; Clopidogrel bisulfate; Codeine; Lisinopril; Statins; Tape; Tylenol [acetaminophen]; and Other   Social History   Social History  . Marital status: Divorced     Spouse name: N/A  . Number of children: N/A  . Years of education: N/A   Occupational History  . Homemaker    Social History Main Topics  . Smoking status: Former Smoker    Packs/day: 1.00    Types: Cigarettes    Quit date: 11/26/2008  . Smokeless tobacco: Never Used  . Alcohol use 8.4 oz/week    14 Shots of liquor per week     Comment: 05/11/2017 "2 martinis per day".  . Drug use: No  . Sexual activity: No   Other Topics Concern  . None   Social History Narrative  . None     Family History: The patient's family history includes Colon cancer in her father and mother. ROS:   Please see the history of present illness.     All other systems reviewed and are negative.  EKGs/Labs/Other Studies Reviewed:    The following studies were reviewed today:  Left Heart Cath  and Cors/Grafts Angiography 05/07/17   1. Severe native three-vessel coronary artery disease with severe diffuse heavily calcified stenosis of the LAD, severe ostial stenosis of the left circumflex, and total occlusion of the RCA 2. Status post aortocoronary bypass surgery with continued patency of the LIMA to LAD and saphenous vein graft to PDA and total occlusion of the saphenous vein graft to diagonal and saphenous vein graft OM. 3. Severe segmental contraction abnormality the left ventricle with LVEF less than 35% and typical appearance of acute Takotsubo syndrome 4. Normal LVEDP 5. Successful PCI of the left circumflex back into the left main (protected left main with a patent LIMA-LAD)  Pt on chronic warfarin for PAF, but currently in sinus. Need to consider anticoagulant/antiplatelet options. Might consider a short duration of dual antiplatelet therapy with aspirin and brilinta then resume anticoagulation with warfarin versus using a combination of warfarin and brilinta. The patient is allergic to clopidogrel and I do not think we should put her on triple therapy because she is a frail 83 year old at high risk  of bleeding.   Echocardiogram 05/09/17 Study Conclusions - Left ventricle: The cavity size was normal. Wall thickness was   normal. Mid to apical anterior, anteroseptal and inferoseptal   akinesis, akinesis of the true apex, apical lateral akinesis,   apical inferior akinesis. No LV apical thrombus noted. Systolic   function was severely reduced. The estimated ejection fraction   was in the range of 25% to 30%. Features are consistent with a   pseudonormal left ventricular filling pattern, with concomitant   abnormal relaxation and increased filling pressure (grade 2   diastolic dysfunction). E/medial e&' > 15 suggests LV end   diastolic pressure at least 20 mmHg. - Aortic valve: Sclerosis without stenosis. - Mitral valve: There was trivial regurgitation. - Left atrium: The atrium was severely dilated. - Right ventricle: The cavity size was normal. Systolic function   was normal. - Tricuspid valve: Peak RV-RA gradient (S): 32 mm Hg. - Pulmonary arteries: PA peak pressure: 35 mm Hg (S). - Inferior vena cava: The vessel was normal in size. The   respirophasic diameter changes were in the normal range (= 50%),   consistent with normal central venous pressure.  Impressions: - Normal LV size with wall motion abnormalities as noted above   (suggestive of LAD-territory infarction), EF 25-30%. Moderate   diastolic dysfunction. Normal RV size and systolic function.   Moderate LAE.   Carotid duplex 02/22/17 Heterogeneous plaque, bilaterally. 1-39% RICA stenosis. LICA stenosis now in the high end range of 1-39%. Normal subclavian arteries, bilaterally. Patent vertebral arteries with antegrade flow. Abnormal thyroid appearance, as described above.  EKG:  EKG is  ordered today.  The ekg ordered today demonstrates normal sinus rhythm at 62 bpm with T-wave inversions similar to previous EKG after her NSTEMI  Recent Labs: 05/11/2017: B Natriuretic Peptide 2,135.9 05/14/2017: BUN 26;  Creatinine, Ser 0.95; Hemoglobin 11.6; Platelets 289; Potassium 3.5; Sodium 137   Recent Lipid Panel    Component Value Date/Time   CHOL (H) 08/23/2010 0420    230        ATP III CLASSIFICATION:  <200     mg/dL   Desirable  200-239  mg/dL   Borderline High  >=240    mg/dL   High          TRIG 71 08/23/2010 0420   HDL 90 08/23/2010 0420   CHOLHDL 2.6 08/23/2010 0420   VLDL 14 08/23/2010 0420   LDLCALC (  H) 08/23/2010 0420    126        Total Cholesterol/HDL:CHD Risk Coronary Heart Disease Risk Table                     Men   Women  1/2 Average Risk   3.4   3.3  Average Risk       5.0   4.4  2 X Average Risk   9.6   7.1  3 X Average Risk  23.4   11.0        Use the calculated Patient Ratio above and the CHD Risk Table to determine the patient's CHD Risk.        ATP III CLASSIFICATION (LDL):  <100     mg/dL   Optimal  100-129  mg/dL   Near or Above                    Optimal  130-159  mg/dL   Borderline  160-189  mg/dL   High  >190     mg/dL   Very High    Physical Exam:    VS:  BP (!) 154/64   Pulse 62   Ht 5' 2.5" (1.588 m)   Wt 108 lb (49 kg)   LMP  (LMP Unknown)   BMI 19.44 kg/m     Wt Readings from Last 3 Encounters:  05/24/17 108 lb (49 kg)  05/14/17 105 lb 8 oz (47.9 kg)  05/09/17 110 lb (49.9 kg)     GEN:  Well nourished, well developed in no acute distress HEENT: Normal NECK: No JVD; No carotid bruits LYMPHATICS: No lymphadenopathy CARDIAC: RRR, no murmurs, rubs, gallops RESPIRATORY:  Clear to auscultation without rales, wheezing or rhonchi  ABDOMEN: Soft, non-tender, non-distended MUSCULOSKELETAL:  No edema; No deformity  SKIN: Warm and dry NEUROLOGIC:  Alert and oriented x 3 PSYCHIATRIC:  Normal affect   ASSESSMENT:    1. Acute on chronic combined systolic (congestive) and diastolic (congestive) heart failure ([place])   2. Coronary artery disease involving native coronary artery of native heart without angina pectoris   3. Paroxysmal atrial  fibrillation (H[facility])   4. HYPERTENSION, BENIGN    PLAN:    In order of problems listed above:  CAD: Recent NSTEMI 05/05/17 with patent grafts to the LAD and PDA and placement of DES in the left main/ostial circumflex. Stable on Brilinta. No chest pain or pressure. No aspirin given need for Coumadin. No beta blocker due to bradycardia. Patient has refused statins in the past.   Chronic combined systolic and diastolic heart failure: Patient readmitted on 05/11/17-05/14/17 with successful diuresis and improvement in clinical symptoms. She was discharged on Lasix 20 mg daily and potassium 10 mEq once daily however the patient states that she has not needed it so she has not been taking it. Her discharge weight was 106 and today's home weight was 106. She weighs daily with no more than a 1 pound difference in her weights. She has no further dyspnea, orthopnea, or edema. We'll continue with the as needed Lasix and patient is aware of symptoms that would require treatment. She has stopped taking her lisinopril due to nausea, fast heartbeat, and profound weakness approximately one hour after taking her first dose. She will consider retrial of the lisinopril after she completes her 3 month course of Brilinta.  Ischemic cardiomyopathy: Possible Takasugi pattern following recent NSTEMI with EF 25-30%. She feels that she is doing much better. She  is not taking ACE inhibitor due to perceived intolerance. No beta blocker due to bradycardia and previous intolerance. She has a follow-up echocardiogram scheduled for 08/07/17.  PAF: Patient had atrial fibrillation with her past surgery and has had no episodes since. Although she felt her heart raced after she took a dose of lisinopril, she doesn't feel like this is similar to when she had atrial fibrillation. No other palpitations or heart racing. She is maintaining sinus rhythm and is on Coumadin for stroke risk reduction. No unusual bleeding  Hypertension: Blood pressure is  mildly elevated today with systolic blood pressure of 154. The patient reports that she has white coat syndrome. And that her blood pressures are usually 124-132 at the Coumadin clinic where she is more comfortable.   Medication Adjustments/Labs and Tests Ordered: Current medicines are reviewed at length with the patient today.  Concerns regarding medicines are outlined above. Labs and tests ordered and medication changes are outlined in the patient instructions below:  Patient Instructions  Medication Instructions:  Your physician recommends that you continue on your current medications as directed. Please refer to the Current Medication list given to you today.   Labwork: NONE ORDERED  Testing/Procedures: NONE ORDERED   Follow-Up: KEPE YOUR APPT WITH DR. Angelena Form 06/13/17  Any Other Special Instructions Will Be Listed Below (If Applicable).     If you need a refill on your cardiac medications before your next appointment, please call your pharmacy.      Signed, Daune Perch, NP  05/24/2017 11:37 AM    [place]

## 2017-05-24 ENCOUNTER — Ambulatory Visit (INDEPENDENT_AMBULATORY_CARE_PROVIDER_SITE_OTHER): Payer: Medicare Other | Admitting: Cardiology

## 2017-05-24 ENCOUNTER — Ambulatory Visit: Payer: Medicare Other

## 2017-05-24 ENCOUNTER — Encounter: Payer: Self-pay | Admitting: Cardiology

## 2017-05-24 VITALS — BP 154/64 | HR 62 | Ht 62.5 in | Wt 108.0 lb

## 2017-05-24 DIAGNOSIS — I480 Paroxysmal atrial fibrillation: Secondary | ICD-10-CM

## 2017-05-24 DIAGNOSIS — I10 Essential (primary) hypertension: Secondary | ICD-10-CM | POA: Diagnosis not present

## 2017-05-24 DIAGNOSIS — I5043 Acute on chronic combined systolic (congestive) and diastolic (congestive) heart failure: Secondary | ICD-10-CM

## 2017-05-24 DIAGNOSIS — I2510 Atherosclerotic heart disease of native coronary artery without angina pectoris: Secondary | ICD-10-CM

## 2017-05-24 NOTE — Patient Instructions (Signed)
Medication Instructions:  Your physician recommends that you continue on your current medications as directed. Please refer to the Current Medication list given to you today.   Labwork: NONE ORDERED  Testing/Procedures: NONE ORDERED   Follow-Up: KEPE YOUR APPT WITH DR. Angelena Form 06/13/17  Any Other Special Instructions Will Be Listed Below (If Applicable).     If you need a refill on your cardiac medications before your next appointment, please call your pharmacy.

## 2017-05-25 ENCOUNTER — Telehealth (HOSPITAL_COMMUNITY): Payer: Self-pay

## 2017-05-25 NOTE — Telephone Encounter (Signed)
I called and left message on patient voicemail to call office about scheduling for cardiac rehab. I left office contact information on patient voicemail to return call.  ° °

## 2017-05-29 ENCOUNTER — Encounter: Payer: Self-pay | Admitting: *Deleted

## 2017-06-07 ENCOUNTER — Telehealth (HOSPITAL_COMMUNITY): Payer: Self-pay

## 2017-06-07 DIAGNOSIS — I480 Paroxysmal atrial fibrillation: Secondary | ICD-10-CM | POA: Diagnosis not present

## 2017-06-07 DIAGNOSIS — Z7901 Long term (current) use of anticoagulants: Secondary | ICD-10-CM | POA: Diagnosis not present

## 2017-06-07 NOTE — Telephone Encounter (Signed)
I called and spoke to patient about scheduling for cardiac rehab. Patient declined scheduling for cardiac rehab. Referral closed.

## 2017-06-13 ENCOUNTER — Ambulatory Visit (INDEPENDENT_AMBULATORY_CARE_PROVIDER_SITE_OTHER): Payer: Medicare Other | Admitting: Cardiovascular Disease

## 2017-06-13 ENCOUNTER — Encounter: Payer: Self-pay | Admitting: Cardiovascular Disease

## 2017-06-13 VITALS — BP 154/60 | HR 69 | Ht 62.5 in | Wt 108.6 lb

## 2017-06-13 DIAGNOSIS — I2510 Atherosclerotic heart disease of native coronary artery without angina pectoris: Secondary | ICD-10-CM

## 2017-06-13 DIAGNOSIS — I255 Ischemic cardiomyopathy: Secondary | ICD-10-CM | POA: Diagnosis not present

## 2017-06-13 DIAGNOSIS — I480 Paroxysmal atrial fibrillation: Secondary | ICD-10-CM

## 2017-06-13 DIAGNOSIS — I739 Peripheral vascular disease, unspecified: Secondary | ICD-10-CM | POA: Diagnosis not present

## 2017-06-13 DIAGNOSIS — I779 Disorder of arteries and arterioles, unspecified: Secondary | ICD-10-CM

## 2017-06-13 DIAGNOSIS — I10 Essential (primary) hypertension: Secondary | ICD-10-CM | POA: Diagnosis not present

## 2017-06-13 NOTE — Patient Instructions (Signed)
Medication Instructions:  Your physician recommends that you continue on your current medications as directed. Please refer to the Current Medication list given to you today.   Labwork: none  Testing/Procedures: Your physician has requested that you have an echocardiogram. Echocardiography is a painless test that uses sound waves to create images of your heart. It provides your doctor with information about the size and shape of your heart and how well your heart's chambers and valves are working. This procedure takes approximately one hour. There are no restrictions for this procedure.  Scheduled for September 25,2018  Follow-Up: Your physician recommends that you schedule a follow-up appointment in: 6 months. Please call our office in about 3 months to schedule this appointment   Any Other Special Instructions Will Be Listed Below (If Applicable).     If you need a refill on your cardiac medications before your next appointment, please call your pharmacy.

## 2017-06-13 NOTE — Progress Notes (Signed)
Chief Complaint  Patient presents with  . Follow-up    History of Present Illness: 83 yo female with history of PAD, CAD s/p CABG in 2010, HTN, hyperlipidemia, former tobacco abuse, paroxysmal atrial fibrillation and COPD who is here today for PV and cardiac follow up. She is known to have PAD. This has been stable with most recent ABI February 2018. Carotid artery dopplers April 2018 with mild bilateral carotid disease. She is known to have CAD and had 4V CABG in 2010. (LIMA to LAD, SVG to Diagonal, SVG to OM2, SVG to PDA). She cancelled stress testing in 2014 when she was having chest pain. She was not interested in pursuing an ischemic evaluation. She was admitted to [facility] June 2018 with a NSTEMI. Cardiac cath 05/07/17 with occluded grafts to the OM and diagonal with patent grafts to the LAD and RCA. A drug eluting stent was placed in the native Circumflex extending back to the ostium into the left main. LVEF was below 35%. Readmitted to [facility] at the end of June 2018 with acute CHF and diuresed well with Lasix. She had been on a low dose Ace-inh  But stopped it as she thought it made her heart race. She also has not tolerated a beta blocker due to bradycardia. She has not tolerated statins. She has been on coumadin given history of atrial fibrillation. She was discharged on Brilinta following placement of her stent but has not liked taking the Brilinta. The plan was for 3 months of Brilinta with coumadin.   She is here today for follow up. The patient denies any chest pain, dyspnea, palpitations, lower extremity edema, orthopnea, PND, dizziness, near syncope or syncope.  She feels great. Weight is stable at home. She is using Lasix as needed.   Primary Care Physician: Marton Redwood, MD  Past Medical History:  Diagnosis Date  . Acute on chronic diastolic CHF (congestive heart failure) ([place])   . Anginal pain ([place])   . Anxiety    "when I have one of these heart spells" (05/11/2017)  . Arrhythmia  02/2009   Paroxysmal Fibrillation  . CAD (coronary artery disease)    status post previous anterior wall myocardial infarction, treated with PTCA dx and subsequent non-ST- elevation myocardial infarction with recent bypass surgery, November 29, 2008.  05/07/17 PCI-->DES to pLCx, EF 35%  . Colitis, ischemic ([address])    Hx of; pt denies this on 05/11/2017  . Colon neoplasm    Family history of  . COPD (chronic obstructive pulmonary disease) ([place])   . Former smoker   . Herpes zoster ophthalmicus   . HTN (hypertension)   . Hyperlipidemia   . Pneumonia 1940s-1950s X 3  . Postherpetic neuralgia   . PVD (peripheral vascular disease) ([place])    with claudication  . Raynaud phenomenon   . SOB (shortness of breath)    uncertain etiology  . STEMI (ST elevation myocardial infarction) ([place])    recent/notes 05/11/2017  . Systolic heart failure [facility])     Past Surgical History:  Procedure Laterality Date  . APPENDECTOMY  1946  . CORONARY ANGIOPLASTY WITH STENT PLACEMENT    . CORONARY ARTERY BYPASS GRAFT  11/27/2008   CABG "X4"  . CORONARY STENT INTERVENTION N/A 05/07/2017   Procedure: Coronary Stent Intervention;  Surgeon: Sherren Mocha, MD;  Location: [facility];  Service: Cardiovascular;  Laterality: N/A;  . DILATION AND CURETTAGE OF UTERUS     S/P miscarriage  . LEFT HEART CATH AND  CORS/GRAFTS ANGIOGRAPHY N/A 05/07/2017   Procedure: Left Heart Cath and Cors/Grafts Angiography;  Surgeon: Sherren Mocha, MD;  Location: [facility] [place] [facility];  Service: Cardiovascular;  Laterality: N/A;  . TONSILLECTOMY  1934    Current Outpatient Prescriptions  Medication Sig Dispense Refill  . ALPRAZolam (XANAX) 0.25 MG tablet Take 0.25 mg by mouth every 8 (eight) hours as needed for anxiety.     Marland Kitchen amLODipine (NORVASC) 5 MG tablet Take 1 tablet (5 mg total) by mouth 2 (two) times daily. 30 tablet 11  . docusate sodium (COLACE) 100 MG capsule Take 100 mg by mouth daily as needed (for constipation).     .  furosemide (LASIX) 20 MG tablet Take 20 mg by mouth daily as needed for edema.  0  . nitroGLYCERIN (NITROSTAT) 0.4 MG SL tablet Place 0.4 mg under the tongue every 5 (five) minutes as needed for chest pain.     . potassium chloride (K-DUR,KLOR-CON) 10 MEQ tablet Take 10 mEq by mouth daily as needed (with lasix).   5  . ticagrelor (BRILINTA) 90 MG TABS tablet Take 1 tablet (90 mg total) by mouth 2 (two) times daily. 60 tablet 3  . Vitamin D, Cholecalciferol, 1000 units CAPS Take 1,000 Units by mouth daily.    Marland Kitchen warfarin (COUMADIN) 2.5 MG tablet Take 2.5-3.75 mg by mouth See admin instructions. 3.75 mg at bedtime on Mon/Wed/Fri/Sat and 2.5 mg on Sun/Tues/Thurs     No current facility-administered medications for this visit.     Allergies  Allergen Reactions  . Beta Adrenergic Blockers     "Beta Blockers"- Reaction not recalled by patient  . Cardizem [Diltiazem Hcl]     Reaction not recalled by patient  . Clopidogrel Bisulfate Nausea Only and Other (See Comments)    Also "could smell it"  . Codeine Nausea And Vomiting  . Lisinopril Nausea And Vomiting    Weak. Patient states she felt horrible after taking it.  . Statins     Reaction not recalled by patient  . Tape Other (See Comments)    THE PATIENT'S SKIN IS THIN AND TEARS EASILY; PLEASE USE COBAN WRAP!!  . Tylenol [Acetaminophen] Nausea And Vomiting  . Other Rash    No dish washing detergent    Social History   Social History  . Marital status: Divorced    Spouse name: N/A  . Number of children: N/A  . Years of education: N/A   Occupational History  . Homemaker    Social History Main Topics  . Smoking status: Former Smoker    Packs/day: 1.00    Types: Cigarettes    Quit date: 11/26/2008  . Smokeless tobacco: Never Used  . Alcohol use 8.4 oz/week    14 Shots of liquor per week     Comment: 05/11/2017 "2 martinis per day".  . Drug use: No  . Sexual activity: No   Other Topics Concern  . Not on file   Social History  Narrative  . No narrative on file    Family History  Problem Relation Age of Onset  . Colon cancer Father   . Colon cancer Mother     Review of Systems:  As stated in the HPI and otherwise negative.   BP (!) 154/60   Pulse 69   Ht 5' 2.5" (1.588 m)   Wt 108 lb 9.6 oz (49.3 kg)   LMP  (LMP Unknown)   SpO2 97%   BMI 19.55 kg/m   Physical Examination:  General:  Well developed, well nourished, NAD  HEENT: OP clear, mucus membranes moist  SKIN: warm, dry. No rashes. Neuro: No focal deficits  Musculoskeletal: Muscle strength 5/5 all ext  Psychiatric: Mood and affect normal  Neck: No JVD, no carotid bruits, no thyromegaly, no lymphadenopathy.  Lungs:Clear bilaterally, no wheezes, rhonci, crackles Cardiovascular: Regular rate and rhythm. No murmurs, gallops or rubs. Abdomen:Soft. Bowel sounds present. Non-tender.  Extremities: No lower extremity edema. Pulses are 2 + in the bilateral DP/PT.   EKG:  EKG is not  ordered today. The ekg ordered today demonstrates   Recent Labs: 05/11/2017: B Natriuretic Peptide 2,135.9 05/14/2017: BUN 26; Creatinine, Ser 0.95; Hemoglobin 11.6; Platelets 289; Potassium 3.5; Sodium 137   Lipid Panel Followed in primary care    Wt Readings from Last 3 Encounters:  06/13/17 108 lb 9.6 oz (49.3 kg)  05/24/17 108 lb (49 kg)  05/14/17 105 lb 8 oz (47.9 kg)     Other studies Reviewed: Additional studies/ records that were reviewed today include: . Review of the above records demonstrates:    Assessment and Plan:   1. PAD:  Stable. No claudication. ABI stabe April 2018.     2. HYPERTENSION:  Blood pressures is controlled.   3. CAD without angina: She had recent hospitalization in June 2018 with a NSTEMI and had a DES placed in the native Circumflex due to occluded SVG to OM. She seems to be doing well. She is on Brilinta and coumadin. No ASA since she is on Brilinta and coumadin. Will plan 3 months of Brilinta then will stop and use ASA along  with coumadin. She does not tolerate beta blockers or statins.    4. CAROTID ARTERY DISEASE:  Mild to moderate disease by carotid dopplers April 2018.   5. ATRIAL FIBRILLATION, paroxysmal: She is in sinus. Continue coumadin.   6. Chronic systolic CHF: Volume status is ok. Lasix prn.   7. Ischemic cardiomyopathy: She does not tolerate beta blockers or Ace-inh/ARB. Repeat echo in September 2018   Current medicines are reviewed at length with the patient today.  The patient does not have concerns regarding medicines.  The following changes have been made:  no change  Labs/ tests ordered today include:   No orders of the defined types were placed in this encounter.   Disposition:   FU with me in 6  months  Signed, Lauree Chandler, MD 06/13/2017 3:06 PM    [facility] [place], [place], [place]  [address] Phone: [phone]; Fax: [phone]

## 2017-07-14 DIAGNOSIS — 419620001 Death: Secondary | SNOMED CT | POA: Diagnosis not present

## 2017-07-14 DEATH — deceased

## 2017-08-07 ENCOUNTER — Other Ambulatory Visit (HOSPITAL_COMMUNITY): Payer: Medicare Other

## 2018-12-25 IMAGING — DX DG CHEST 2V
2 series · 2 of 2 positions shown · non-contrast
Comparison: 05/05/2017

CLINICAL DATA: Chest pressure and heaviness with burning sensation
between shoulder blades associated with shortness of breath and
nausea since 4554 hours, head coronary arterial stent placed on
05/07/2017, hypertension, former smoker, COPD, coronary artery
disease

EXAM:
CHEST  2 VIEW

[chest pa]
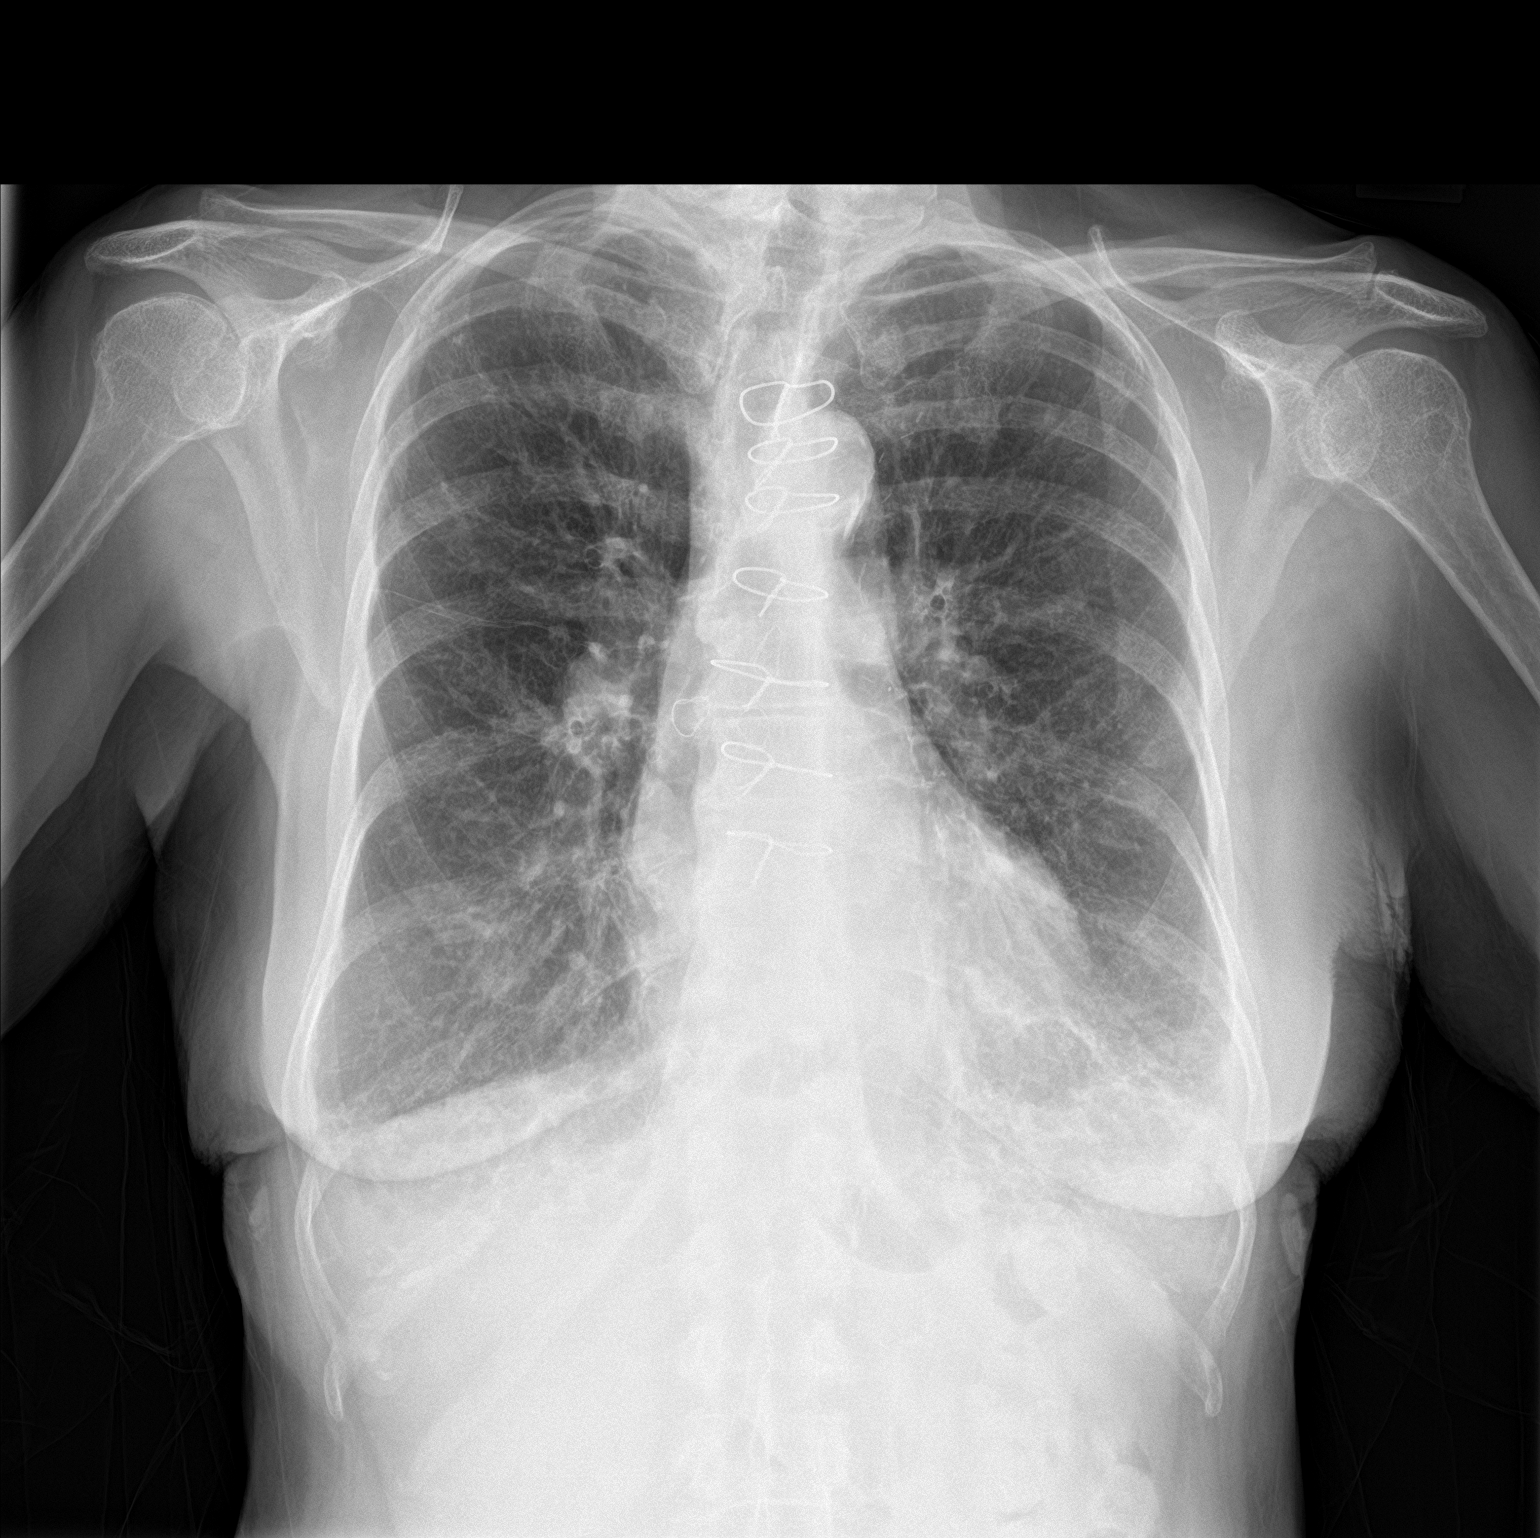

[chest lat]
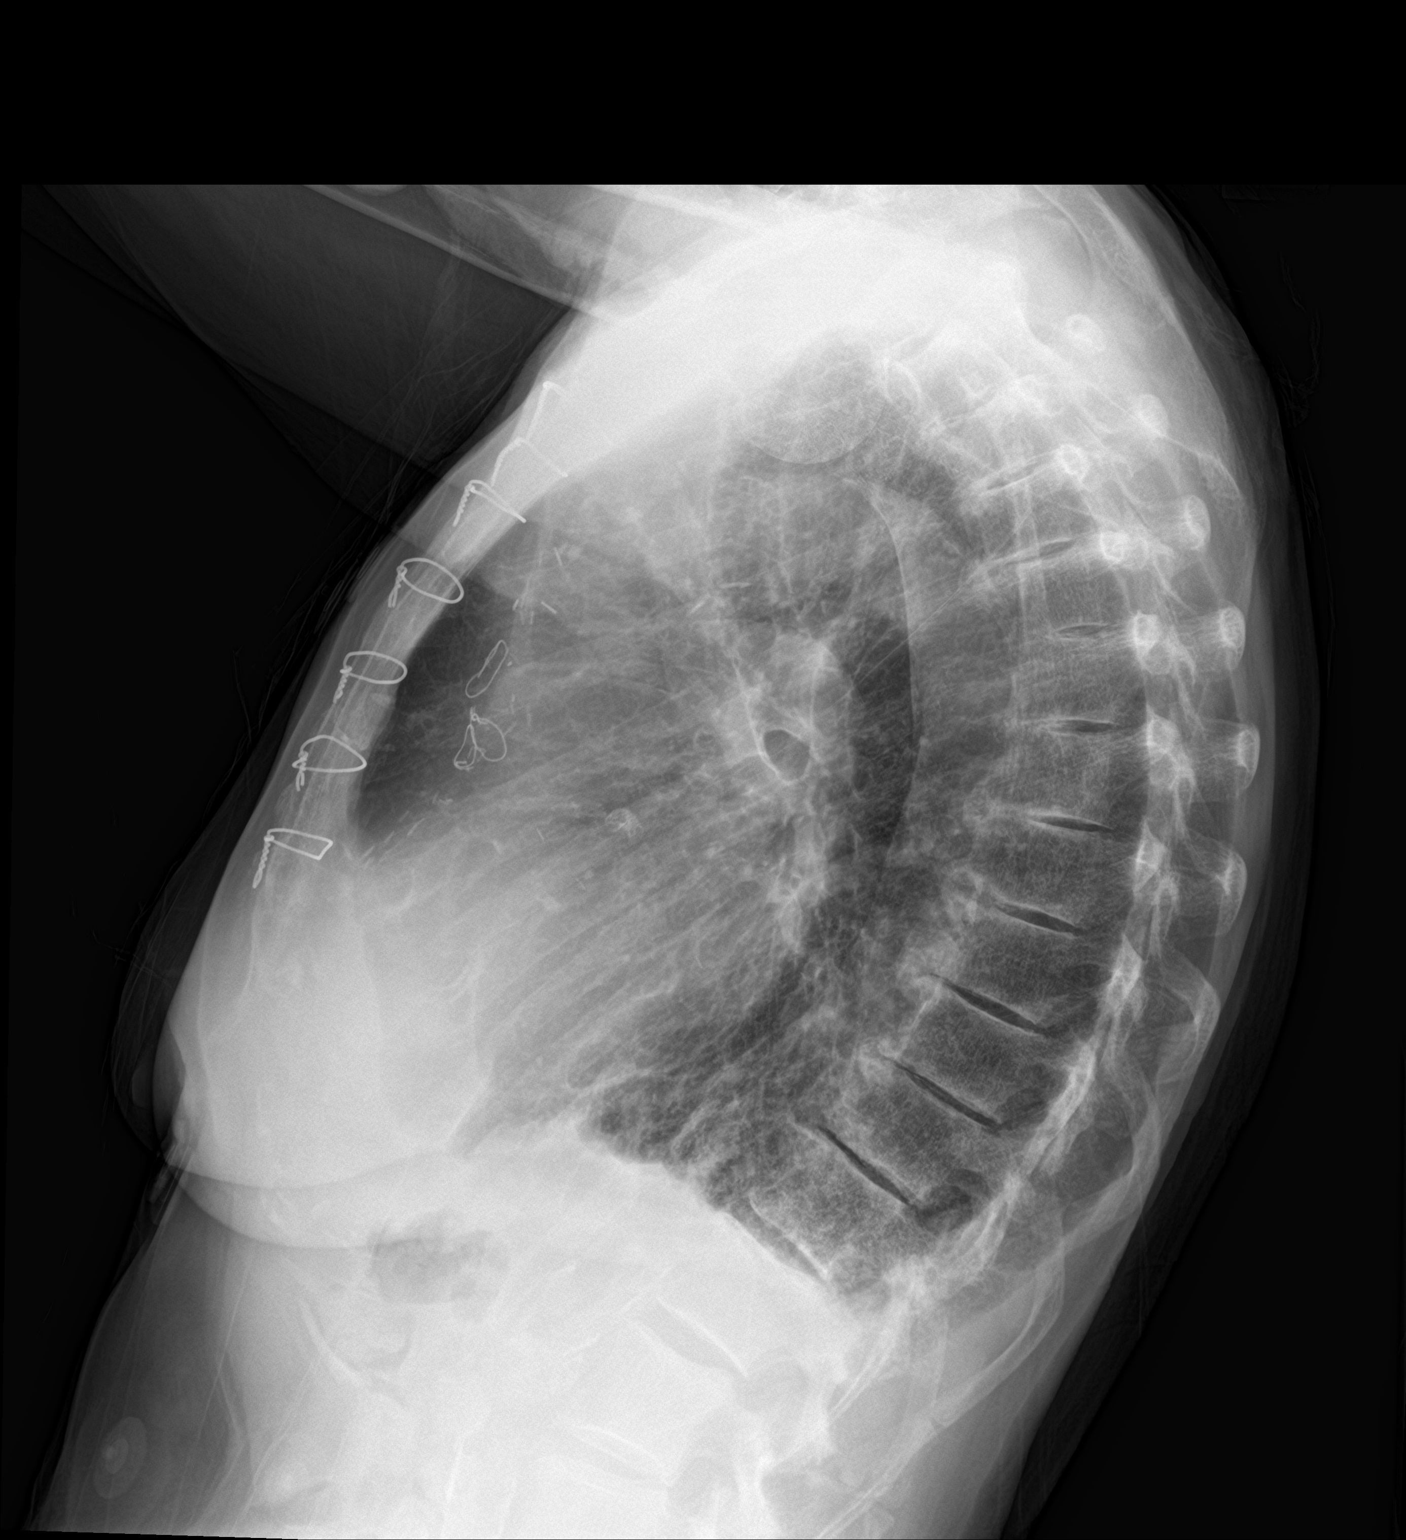

[2 of 2 positions shown; findings below may reference images not displayed]

FINDINGS: Normal heart size post CABG.

Atherosclerotic calcification aorta.

Emphysematous and bronchitic changes compatible with COPD.

Increased bibasilar markings question mild interstitial infiltrate.

No definite pleural effusion or pneumothorax.

Bones demineralized.
IMPRESSION: Post CABG.

COPD changes with question mild bibasilar interstitial infiltrates
versus edema.

Aortic Atherosclerosis (49CJT-WF3.3) and Emphysema (49CJT-9D2.X).
